# Patient Record
Sex: Female | Born: 1985 | Race: White | Hispanic: No | Marital: Single | Smoking: Former smoker
Health system: Southern US, Community
[De-identification: ages and names within clinical notes are randomized; demographics above are authoritative.]

## PROBLEM LIST (undated history)

## (undated) DIAGNOSIS — N390 Urinary tract infection, site not specified: Secondary | ICD-10-CM

## (undated) DIAGNOSIS — N159 Renal tubulo-interstitial disease, unspecified: Secondary | ICD-10-CM

## (undated) DIAGNOSIS — R87629 Unspecified abnormal cytological findings in specimens from vagina: Secondary | ICD-10-CM

## (undated) DIAGNOSIS — I10 Essential (primary) hypertension: Secondary | ICD-10-CM

## (undated) DIAGNOSIS — M1990 Unspecified osteoarthritis, unspecified site: Secondary | ICD-10-CM

## (undated) HISTORY — DX: Essential (primary) hypertension: I10

## (undated) HISTORY — DX: Renal tubulo-interstitial disease, unspecified: N15.9

## (undated) HISTORY — PX: BLADDER SURGERY: SHX569

## (undated) HISTORY — PX: COLPOSCOPY: SHX161

## (undated) HISTORY — DX: Unspecified abnormal cytological findings in specimens from vagina: R87.629

## (undated) HISTORY — DX: Unspecified osteoarthritis, unspecified site: M19.90

---
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
## 2005-12-02 ENCOUNTER — Inpatient Hospital Stay (HOSPITAL_COMMUNITY): Admission: RE | Admit: 2005-12-02 | Discharge: 2005-12-04 | Payer: Self-pay | Admitting: Obstetrics and Gynecology

## 2009-03-28 ENCOUNTER — Other Ambulatory Visit: Admission: RE | Admit: 2009-03-28 | Discharge: 2009-03-28 | Payer: Self-pay | Admitting: Obstetrics and Gynecology

## 2009-08-22 ENCOUNTER — Inpatient Hospital Stay (HOSPITAL_COMMUNITY): Admission: RE | Admit: 2009-08-22 | Discharge: 2009-08-25 | Payer: Self-pay | Admitting: Obstetrics and Gynecology

## 2009-08-22 ENCOUNTER — Ambulatory Visit: Payer: Self-pay | Admitting: Family Medicine

## 2010-07-07 LAB — COMPREHENSIVE METABOLIC PANEL
ALT: 37 U/L — ABNORMAL HIGH (ref 0–35)
AST: 27 U/L (ref 0–37)
Albumin: 2.8 g/dL — ABNORMAL LOW (ref 3.5–5.2)
Alkaline Phosphatase: 190 U/L — ABNORMAL HIGH (ref 39–117)
BUN: 7 mg/dL (ref 6–23)
CO2: 25 mEq/L (ref 19–32)
Calcium: 9.7 mg/dL (ref 8.4–10.5)
Chloride: 104 mEq/L (ref 96–112)
Creatinine, Ser: 0.46 mg/dL (ref 0.4–1.2)
GFR calc Af Amer: >60 mL/min (ref >60)
GFR calc non Af Amer: >60 mL/min (ref >60)
Glucose, Bld: 81 mg/dL (ref 70–99)
Potassium: 3.6 mEq/L (ref 3.5–5.1)
Sodium: 136 mEq/L (ref 135–145)
Total Bilirubin: 0.3 mg/dL (ref 0.3–1.2)
Total Protein: 7.3 g/dL (ref 6.0–8.3)

## 2010-07-07 LAB — TYPE AND SCREEN
ABO/RH(D): A NEG
Antibody Screen: NEGATIVE

## 2010-07-07 LAB — URINALYSIS, ROUTINE W REFLEX MICROSCOPIC
Bilirubin Urine: NEGATIVE
Glucose, UA: NEGATIVE mg/dL
Hgb urine dipstick: NEGATIVE
Ketones, ur: NEGATIVE mg/dL
Nitrite: NEGATIVE
Protein, ur: NEGATIVE mg/dL
Specific Gravity, Urine: 1.02 (ref 1.005–1.030)
Urobilinogen, UA: 0.2 mg/dL (ref 0.0–1.0)
pH: 7 (ref 5.0–8.0)

## 2010-07-07 LAB — CBC
HCT: 33.2 % — ABNORMAL LOW (ref 36.0–46.0)
HCT: 38.6 % (ref 36.0–46.0)
Hemoglobin: 13.2 g/dL (ref 12.0–15.0)
MCHC: 34.2 g/dL (ref 30.0–36.0)
MCV: 80.3 fL (ref 78.0–100.0)
MCV: 80.4 fL (ref 78.0–100.0)
Platelets: 207 10*3/uL (ref 150–400)
Platelets: 230 10*3/uL (ref 150–400)
RBC: 4.8 MIL/uL (ref 3.87–5.11)
RDW: 14.5 % (ref 11.5–15.5)
RDW: 14.8 % (ref 11.5–15.5)
WBC: 10.9 10*3/uL — ABNORMAL HIGH (ref 4.0–10.5)

## 2010-07-07 LAB — RPR: RPR Ser Ql: NONREACTIVE

## 2010-07-07 LAB — RH IMMUNE GLOB WKUP(>/=20WKS)(NOT WOMEN'S HOSP)

## 2010-09-04 NOTE — H&P (Signed)
NAMEKarra, [name]                 ACCOUNT NO.:  [id number]   MEDICAL RECORD NO.:  [id number]          PATIENT TYPE:  AMB   LOCATION:  DAY                           FACILITY:  [facility]   PHYSICIAN:  Tilda Burrow, M.D. DATE OF BIRTH:  [birth year]   DATE OF ADMISSION:  DATE OF DISCHARGE:  LH                                HISTORY & PHYSICAL   ADMISSION DIAGNOSES:  Pregnancy, 39 weeks' gestation.  Repeat cesarean  section nor for trial of labor.  History of bladder reconstruction as a  child.  Discouraged from vaginal delivery.  Morbid obesity.   HISTORY OF PRESENT ILLNESS:  This 25 year old female, gravida 2, para 1, AB  0, LMP irregular with an ultrasound of August 23rd and then corrected August  15 based on third-trimester ultrasound after a late presentation.  She has  had generous fundal height growth and is expected to have a baby of quite  good size.  It weighed 3065 gm on November 11, 2005 by _office_  ultrasound  estimate.  Clinically, the baby should weigh 8 pounds.  She has a prior  history of a cesarean section to protect her bladder reconstruction  performed as a child.  Review of the operative report details an uneventful  cesarean delivery through a Pfannenstiel incision.  This was performed at  [facility] by Dr. Gilford Silvius, notable for nuchal cord x2 but otherwise uneventful.   PAST MEDICAL HISTORY:  Benign.   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   ALLERGIES:  None.   SOCIAL HISTORY:  Single.  Lives with mom.  Works at [employer].   FAMILY HISTORY:  Positive for hypertension, diabetes and cancer.   PHYSICAL EXAMINATION:  GENERAL:  A morbidly obese, Caucasian female.  VITAL SIGNS:  Weight 277 pounds which is a 4-pound weight loss since June.  Blood pressure 152/62 with small cuff.  Reflex is 1+ to 2+.   Urinalysis shows 3+ blood and leukocytes.  Urine culture was negative.  Urine culture suggests infection and she  was treated with Keflex most  recently.  She has been treated three times in this pregnancy.   PRENATAL LABS:  Blood type A negative.  Rubella immunity present.  Urine  drug screen negative.  RhoGAM administered early August rubella immunity  present.  Hemoglobin 11, hematocrit 37.  Hepatitis, HIV, RPR, GC and  Chlamydia all negative.  Group B strep negative.   She plans to follow for a planned circumcision if it is a female.  I suspect  it is female though.  Birth control pills are planned on using in the  future.  She plans later once she is past 19 to consider permanent  sterilization after six months.   PLAN:  Repeat cesarean section 12/02/2005.      Tilda Burrow, M.D.  Electronically Signed     JVF/MEDQ  D:  12/01/2005  T:  12/01/2005  Job:  [id number]   cc:   Francoise Schaumann. Milford Cage DO, FAAP  Fax: [phone]

## 2010-09-04 NOTE — Op Note (Signed)
[name], [name]                 ACCOUNT NO.:  [id number]   MEDICAL RECORD NO.:  [id number]          PATIENT TYPE:  INP   LOCATION:  A401                          FACILITY:  [facility]   PHYSICIAN:  Tilda Burrow, M.D. DATE OF BIRTH:  [birth year]   DATE OF PROCEDURE:  12/02/2005  DATE OF DISCHARGE:  12/04/2005                                 OPERATIVE REPORT   PREOPERATIVE DIAGNOSES:  1. Pregnancy [redacted] weeks gestation.  2. Repeat cesarean section not for trial of labor.  3. History of bladder reconstruction.   POSTOPERATIVE DIAGNOSES:  1. Pregnancy [redacted] weeks gestation.  2. Repeat cesarean section not for trial of labor.  3. History of bladder reconstruction.  4. Umbilical hernia.   PROCEDURE:  1. Repeat low transverse cervical cesarean section.  2. Umbilical herniorrhaphy.   SURGEON:  Tilda Burrow, M.D.   ASSISTANT:  None.   ANESTHESIA:  General.   COMPLICATIONS:  None.   FINDINGS:  Small urethra making bladder catheterization challenging.  Evidence of prior periurethral surgery. Large 4 cm umbilical hernia, omental  attachments are evident into the old C section scar line.   DESCRIPTION OF PROCEDURE:  The patient was taken to the operating room,  prepped and draped for lower abdominal surgery. A Pfannenstiel incision was  repeated. The patient had an atypical abdomen with a fairly small panniculus  in the midline but generous like skin and abdominal fat on either side. A  relatively easily accessed lower abdomen was encountered and the transverse  Pfannenstiel type incision performed. Omentum was adherent in the old  midline incision separation. This was peeled free. A bladder flap was  developed on the lower uterine segment, transverse uterine incision made and  extended laterally using index finger traction and the fetal vertex guided  into the incision and delivered using vacuum extraction and fundal pressure.  The infant cried promptly and was passed to Dr. Milford Cage  was for further care.  Cord blood samples were obtained, uterus irrigated with antibiotic solution  and closed using running locking #0 chromic. Additional figure-of-eight  sutures were needed x2 to achieve hemostasis. The bladder flap was  reapproximated. The anterior abdominal wall was inspected further and there  was a fairly significant 4 cm umbilical hernia encountered. Using an  inversion technique to access the fascia from beneath, we closed the  umbilical hernia in a running fashion using #0 Prolene. The peritoneum was  then closed. It was apparent that the fascial closure had not been performed  at the peritoneal level at the last time, consistent with acceptable  surgical technique. This time it was closed to reduce recurrence of omental  adhesions to the back of the fascia. The fascia itself was then closed in a  running fashion with #0 Vicryl. The subcutaneous fatty tissues had  interrupted 2-0 plane sutures placed and a JP drain allowed to exit the  incision on one side through the incision itself and staple closure of the  skin completed the procedure. Estimated blood loss 600 mL.      Tilda Burrow, M.D.  Electronically  Signed     JVF/MEDQ  D:  12/06/2005  T:  12/07/2005  Job:  [id number]   cc:   Francoise Schaumann. Milford Cage DO, FAAP  Fax: [phone]

## 2010-09-04 NOTE — Discharge Summary (Signed)
[name], [name]                 ACCOUNT NO.:  [id number]   MEDICAL RECORD NO.:  [id number]          PATIENT TYPE:  INP   LOCATION:  A401                          FACILITY:  [facility]   PHYSICIAN:  Lazaro Arms, M.D.   DATE OF BIRTH:  07-20-1985   DATE OF ADMISSION:  12/02/2005  DATE OF DISCHARGE:  08/18/2007LH                                 DISCHARGE SUMMARY   DISCHARGE DIAGNOSES:  1. Status post a repeat cesarean section.  2. Repair of umbilical hernia.  3. Unremarkable postoperative course.   PROCEDURE:  Stated above.   Please refer to the transcribed history and physical as well as the  antepartum chart for details of admission to the hospital.   HOSPITAL COURSE:  The patient was brought in on the morning of December 02, 2005, underwent a planned repeat cesarean section which went without  difficulty.  She also had a small umbilical hernia repair done at that time.  Her postoperative course has been entirely unremarkable.  She tolerated a  clear liquid and regular diet.  She has voided without symptoms, has been  ambulatory.  Her post-op day #1 hemoglobin is 10.6 and hematocrit is 30.6.  On post-op day #1, her white count is 9300.  Her incision is clean, dry and  intact.  Her JP drain output is appropriate serosanguineous fluid, slightly  blood-tinged, and an appropriate volume.  Her abdominal exam is benign.  She  is eating well and tolerating oral pain medicines.  She is discharged to  home on Tylox and Chromagen and will be seen back in the office next week to  have her incision evaluated and probably her JP drain removed at that time.      Lazaro Arms, M.D.  Electronically Signed     LHE/MEDQ  D:  12/04/2005  T:  12/04/2005  Job:  [id number]

## 2013-10-15 ENCOUNTER — Emergency Department (HOSPITAL_COMMUNITY)
Admission: EM | Admit: 2013-10-15 | Discharge: 2013-10-15 | Disposition: A | Discharge status: Home / Self Care | Payer: Self-pay | Attending: Emergency Medicine | Admitting: Emergency Medicine

## 2013-10-15 ENCOUNTER — Encounter (HOSPITAL_COMMUNITY): Payer: Self-pay | Admitting: Emergency Medicine

## 2013-10-15 DIAGNOSIS — Z3202 Encounter for pregnancy test, result negative: Secondary | ICD-10-CM | POA: Insufficient documentation

## 2013-10-15 DIAGNOSIS — R110 Nausea: Secondary | ICD-10-CM | POA: Insufficient documentation

## 2013-10-15 DIAGNOSIS — F17200 Nicotine dependence, unspecified, uncomplicated: Secondary | ICD-10-CM | POA: Insufficient documentation

## 2013-10-15 DIAGNOSIS — N390 Urinary tract infection, site not specified: Secondary | ICD-10-CM | POA: Insufficient documentation

## 2013-10-15 DIAGNOSIS — R300 Dysuria: Secondary | ICD-10-CM | POA: Insufficient documentation

## 2013-10-15 LAB — URINALYSIS, ROUTINE W REFLEX MICROSCOPIC
BILIRUBIN URINE: NEGATIVE
Glucose, UA: NEGATIVE mg/dL
KETONES UR: NEGATIVE mg/dL
Leukocytes, UA: NEGATIVE
Nitrite: POSITIVE — AB
PH: 6 (ref 5.0–8.0)
SPECIFIC GRAVITY, URINE: 1.02 (ref 1.005–1.030)
UROBILINOGEN UA: 0.2 mg/dL (ref 0.0–1.0)

## 2013-10-15 LAB — PREGNANCY, URINE: Preg Test, Ur: NEGATIVE

## 2013-10-15 LAB — URINE MICROSCOPIC-ADD ON

## 2013-10-15 MED ORDER — CIPROFLOXACIN HCL 500 MG PO TABS: 500.0000 mg | ORAL_TABLET | Freq: Two times a day (BID) | ORAL | Status: DC

## 2013-10-15 MED ORDER — CIPROFLOXACIN HCL 250 MG PO TABS
500.0000 mg | ORAL_TABLET | Freq: Once | ORAL | Status: AC
  Administered 2013-10-15: 500 mg via ORAL
  Filled 2013-10-15: qty 2

## 2013-10-15 MED ORDER — FLUCONAZOLE 100 MG PO TABS
150.0000 mg | ORAL_TABLET | Freq: Once | ORAL | Status: AC
  Administered 2013-10-15: 150 mg via ORAL
  Filled 2013-10-15: qty 2

## 2013-10-15 MED ORDER — PHENAZOPYRIDINE HCL 200 MG PO TABS: 200.0000 mg | ORAL_TABLET | Freq: Three times a day (TID) | ORAL | Status: DC

## 2013-10-15 MED ORDER — PHENAZOPYRIDINE HCL 100 MG PO TABS
200.0000 mg | ORAL_TABLET | Freq: Once | ORAL | Status: AC
  Administered 2013-10-15: 200 mg via ORAL
  Filled 2013-10-15: qty 2

## 2013-10-15 NOTE — ED Notes (Signed)
Attempted in and out cath without success, Kerrie BuffaloHope Neese, NP notified

## 2013-10-15 NOTE — ED Notes (Signed)
Low back pain , nausea, Thinks she has a UTI

## 2013-10-15 NOTE — ED Provider Notes (Signed)
History/physical exam/procedure(s) were performed by non-physician practitioner and as supervising physician I was immediately available for consultation/collaboration. I have reviewed all notes and am in agreement with care and plan.   Danielle S Ray, MD 10/15/13 2336 

## 2013-10-15 NOTE — ED Provider Notes (Signed)
CSN: [id number]     Arrival date & time 10/15/13  1920 History   None    Chief Complaint  Patient presents with  . Back Pain     (Consider location/radiation/quality/duration/timing/severity/associated sxs/prior Treatment) Patient is a 28 y.o. female presenting with dysuria. The history is provided by the patient.  Dysuria Pain quality:  Burning Pain severity:  Moderate Onset quality:  Gradual Duration:  2 weeks Timing:  Constant Progression:  Waxing and waning Chronicity:  Recurrent Recent urinary tract infections: yes   Relieved by:  Nothing Worsened by:  Nothing tried Ineffective treatments:  Cranberry juice Urinary symptoms: discolored urine, foul-smelling urine, frequent urination and hematuria   Associated symptoms: abdominal pain and nausea   Associated symptoms: no fever and no vomiting   Risk factors: recurrent urinary tract infections   Risk factors: no renal disease    Shane L Cira ServantManus is a 28 y.o. female who presents to the ED with back pain, dysuria, nausea and fowl smelling urine. 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 She has had frequent UTI's that she thinks resulted from that. When she get treated for UTI she always gets a yeast infection. She denies vaginal discharge. She does not have a PCP. [facility].   History reviewed. No pertinent past medical history. Past Surgical History  Procedure Laterality Date  . Bladder surgery    . Cesarean section     History reviewed. No pertinent family history. History  Substance Use Topics  . Smoking status: Current Every Day Smoker  . Smokeless tobacco: Not on file  . Alcohol Use: Yes   OB History   Grav Para Term Preterm Abortions TAB SAB Ect Mult Living                 Review of Systems  Constitutional: Negative for fever.  Gastrointestinal: Positive for nausea and abdominal pain.  Negative for vomiting.  Genitourinary: Positive for dysuria.  Musculoskeletal: Positive for back pain.  all other systems negative.     Allergies  Review of patient's allergies indicates no known allergies.  Home Medications   Prior to Admission medications   Not on File   BP 142/79  Pulse 64  Temp(Src) 98.3 F (36.8 C)  Resp 24  Ht 5\' 6"  (1.676 m)  Wt 260 lb (117.935 kg)  BMI 41.99 kg/m2  SpO2 98%  LMP 08/01/2013 Physical Exam  Nursing note and vitals reviewed. Constitutional: She is oriented to person, place, and time. She appears well-developed and well-nourished. No distress.  HENT:  Head: Normocephalic.  Eyes: Conjunctivae and EOM are normal.  Neck: Neck supple.  Cardiovascular: Normal rate.   Pulmonary/Chest: Effort normal and breath sounds normal.  Abdominal: Soft. Bowel sounds are normal. There is tenderness in the suprapubic area. There is no CVA tenderness.  Musculoskeletal: Normal range of motion.  Neurological: She is alert and oriented to person, place, and time. No cranial nerve deficit.  Skin: Skin is warm and dry.  Psychiatric: She has a normal mood and affect. Her behavior is normal.    ED Course  Procedures (including critical care time) Labs, Cipro 500 mg PO, Pyridium 200 mg PO Results for orders placed during the hospital encounter of 10/15/13 (from the past 24 hour(s))  URINALYSIS, ROUTINE W REFLEX MICROSCOPIC     Status: Abnormal   Collection Time  10/15/13  8:52 PM      Result Value Ref Range   Color, Urine YELLOW  YELLOW   APPearance HAZY (*) CLEAR   Specific Gravity, Urine 1.020  1.005 - 1.030   pH 6.0  5.0 - 8.0   Glucose, UA NEGATIVE  NEGATIVE mg/dL   Hgb urine dipstick LARGE (*) NEGATIVE   Bilirubin Urine NEGATIVE  NEGATIVE   Ketones, ur NEGATIVE  NEGATIVE mg/dL   Protein, ur TRACE (*) NEGATIVE mg/dL   Urobilinogen, UA 0.2  0.0 - 1.0 mg/dL   Nitrite POSITIVE (*) NEGATIVE   Leukocytes, UA NEGATIVE  NEGATIVE  PREGNANCY, URINE      Status: None   Collection Time    10/15/13  8:52 PM      Result Value Ref Range   Preg Test, Ur NEGATIVE  NEGATIVE  URINE MICROSCOPIC-ADD ON     Status: Abnormal   Collection Time    10/15/13  8:52 PM      Result Value Ref Range   Squamous Epithelial / LPF RARE  RARE   WBC, UA 7-10  <3 WBC/hpf   RBC / HPF 21-50  <3 RBC/hpf   Bacteria, UA MANY (*) RARE     MDM  28 y.o. female with frequency, urgency, dysuria and low back pain x 2 weeks with worsening symptoms. Will treat for UTI and send urine for culture. [facility]. She will return here as needed. I have reviewed this patient's vital signs, nurses notes, appropriate labs and discussed finding and plan of care with the patient and she voices understanding.    Medication List         ciprofloxacin 500 MG tablet  Commonly known as:  CIPRO  Take 1 tablet (500 mg total) by mouth 2 (two) times daily.     phenazopyridine 200 MG tablet  Commonly known as:  PYRIDIUM  Take 1 tablet (200 mg total) by mouth 3 (three) times daily.           [place] August[place] M Neese, [place] 10/15/13 2141

## 2013-10-19 LAB — URINE CULTURE

## 2013-10-20 ENCOUNTER — Telehealth (HOSPITAL_BASED_OUTPATIENT_CLINIC_OR_DEPARTMENT_OTHER): Payer: Self-pay | Admitting: Emergency Medicine

## 2013-10-20 NOTE — Telephone Encounter (Signed)
Post ED Visit - Positive Culture Follow-up  Culture report reviewed by antimicrobial stewardship pharmacist: [] Wes Dulaney, Pharm.D., BCPS [] Jeremy Frens, Pharm.D., BCPS [] Elizabeth Martin, Pharm.D., BCPS [x] Minh Pham, Pharm.D., BCPS, AAHIVP [] Michelle Turner, Pharm.D., BCPS, AAHIVP  Positive urine culture Treated with Cipro, organism sensitive to the same and no further patient follow-up is required at this time.  [name] 10/20/2013, 9:53 AM   

## 2014-05-12 ENCOUNTER — Encounter (HOSPITAL_COMMUNITY): Payer: Self-pay | Admitting: Emergency Medicine

## 2014-05-12 ENCOUNTER — Emergency Department (HOSPITAL_COMMUNITY)
Admission: EM | Admit: 2014-05-12 | Discharge: 2014-05-12 | Disposition: A | Discharge status: Home / Self Care | Payer: Self-pay | Attending: Emergency Medicine | Admitting: Emergency Medicine

## 2014-05-12 DIAGNOSIS — B9689 Other specified bacterial agents as the cause of diseases classified elsewhere: Secondary | ICD-10-CM

## 2014-05-12 DIAGNOSIS — J028 Acute pharyngitis due to other specified organisms: Secondary | ICD-10-CM

## 2014-05-12 DIAGNOSIS — H9203 Otalgia, bilateral: Secondary | ICD-10-CM | POA: Insufficient documentation

## 2014-05-12 DIAGNOSIS — Z79899 Other long term (current) drug therapy: Secondary | ICD-10-CM | POA: Insufficient documentation

## 2014-05-12 DIAGNOSIS — Z792 Long term (current) use of antibiotics: Secondary | ICD-10-CM | POA: Insufficient documentation

## 2014-05-12 DIAGNOSIS — Z720 Tobacco use: Secondary | ICD-10-CM | POA: Insufficient documentation

## 2014-05-12 DIAGNOSIS — J029 Acute pharyngitis, unspecified: Secondary | ICD-10-CM | POA: Insufficient documentation

## 2014-05-12 LAB — RAPID STREP SCREEN (MED CTR MEBANE ONLY): STREPTOCOCCUS, GROUP A SCREEN (DIRECT): NEGATIVE

## 2014-05-12 MED ORDER — PENICILLIN G BENZATHINE 1200000 UNIT/2ML IM SUSP
1.2000 10*6.[IU] | Freq: Once | INTRAMUSCULAR | Status: AC
  Administered 2014-05-12: 1.2 10*6.[IU] via INTRAMUSCULAR
  Filled 2014-05-12: qty 2

## 2014-05-12 MED ORDER — ACETAMINOPHEN-CODEINE #3 300-30 MG PO TABS: 1.0000 | ORAL_TABLET | Freq: Four times a day (QID) | ORAL | Status: DC | PRN

## 2014-05-12 MED ORDER — DEXAMETHASONE SODIUM PHOSPHATE 10 MG/ML IJ SOLN
10.0000 mg | Freq: Once | INTRAMUSCULAR | Status: AC
  Administered 2014-05-12: 10 mg via INTRAMUSCULAR
  Filled 2014-05-12: qty 1

## 2014-05-12 MED ORDER — IBUPROFEN 800 MG PO TABS
800.0000 mg | ORAL_TABLET | Freq: Once | ORAL | Status: AC
  Administered 2014-05-12: 800 mg via ORAL
  Filled 2014-05-12: qty 1

## 2014-05-12 NOTE — Discharge Instructions (Signed)
1. Medications: Tylenol #3 for severe throat pain, usual home medications 2. Treatment: rest, drink plenty of fluids,  3. Follow Up: Please followup with your primary doctor in 3 days for discussion of your diagnoses and further evaluation after today's visit; if you do not have a primary care doctor use the resource guide provided to find one; Please return to the ER for difficulty breathing, feelings of throat closing, persistent high fevers or generally worsening symptoms   Pharyngitis Pharyngitis is redness, pain, and swelling (inflammation) of your pharynx.  CAUSES  Pharyngitis is usually caused by infection. Most of the time, these infections are from viruses (viral) and are part of a cold. However, sometimes pharyngitis is caused by bacteria (bacterial). Pharyngitis can also be caused by allergies. Viral pharyngitis may be spread from person to person by coughing, sneezing, and personal items or utensils (cups, forks, spoons, toothbrushes). Bacterial pharyngitis may be spread from person to person by more intimate contact, such as kissing.  SIGNS AND SYMPTOMS  Symptoms of pharyngitis include:   Sore throat.   Tiredness (fatigue).   Low-grade fever.   Headache.  Joint pain and muscle aches.  Skin rashes.  Swollen lymph nodes.  Plaque-like film on throat or tonsils (often seen with bacterial pharyngitis). DIAGNOSIS  Your health care provider will ask you questions about your illness and your symptoms. Your medical history, along with a physical exam, is often all that is needed to diagnose pharyngitis. Sometimes, a rapid strep test is done. Other lab tests may also be done, depending on the suspected cause.  TREATMENT  Viral pharyngitis will usually get better in 3-4 days without the use of medicine. Bacterial pharyngitis is treated with medicines that kill germs (antibiotics).  HOME CARE INSTRUCTIONS   Drink enough water and fluids to keep your urine clear or pale yellow.    Only take over-the-counter or prescription medicines as directed by your health care provider:   If you are prescribed antibiotics, make sure you finish them even if you start to feel better.   Do not take aspirin.   Get lots of rest.   Gargle with 8 oz of salt water ( tsp of salt per 1 qt of water) as often as every 1-2 hours to soothe your throat.   Throat lozenges (if you are not at risk for choking) or sprays may be used to soothe your throat. SEEK MEDICAL CARE IF:   You have large, tender lumps in your neck.  You have a rash.  You cough up green, yellow-brown, or bloody spit. SEEK IMMEDIATE MEDICAL CARE IF:   Your neck becomes stiff.  You drool or are unable to swallow liquids.  You vomit or are unable to keep medicines or liquids down.  You have severe pain that does not go away with the use of recommended medicines.  You have trouble breathing (not caused by a stuffy nose). MAKE SURE YOU:   Understand these instructions.  Will watch your condition.  Will get help right away if you are not doing well or get worse. Document Released: 04/05/2005 Document Revised: 01/24/2013 Document Reviewed: 12/11/2012 [facility] Patient Information 2015 [place], [place]. This information is not intended to replace advice given to you by your health care provider. Make sure you discuss any questions you have with your health care provider.   Emergency Department Resource Guide 1) Find a Doctor and Pay Out of Pocket Although you won't have to find out who is covered by your insurance plan, it  is a good idea to ask around and get recommendations. You will then need to call the office and see if the doctor you have chosen will accept you as a new patient and what types of options they offer for patients who are self-pay. Some doctors offer discounts or will set up payment plans for their patients who do not have insurance, but you will need to ask so you aren't surprised when you  get to your appointment.  2) Contact Your Local Health Department Not all health departments have doctors that can see patients for sick visits, but many do, so it is worth a call to see if yours does. If you don't know where your local health department is, you can check in your phone book. The CDC also has a tool to help you locate your state's health department, and many state websites also have listings of all of their local health departments.  3) Find a Walk-in Clinic If your illness is not likely to be very severe or complicated, you may want to try a walk in clinic. These are popping up all over the country in pharmacies, drugstores, and shopping centers. They're usually staffed by nurse practitioners or physician assistants that have been trained to treat common illnesses and complaints. They're usually fairly quick and inexpensive. However, if you have serious medical issues or chronic medical problems, these are probably not your best option.  No Primary Care Doctor: - Call Health Connect at  [phone] - they can help you locate a primary care doctor that  accepts your insurance, provides certain services, etc. - Physician Referral Service- [phone]  Chronic Pain Problems: Organization         Address  Phone   Notes  [facility]  [phone] Patients need to be referred by their primary care doctor.   Medication Assistance: Organization         Address  Phone   Notes  [facility] [address] [place], [place] [address] [phone] --Must be a resident of [facility] -- Must have NO insurance coverage whatsoever (no Medicaid/ Medicare, etc.) -- The pt. MUST have a primary care doctor that directs their care regularly and follows them in the community   [facility]ist  [phone]   [employer]  [phone]    Agencies that provide inexpensive medical care: Organization         Address  Phone    Notes  [facility]  [phone]   [facility]    [phone]) [phone]   [facility] [address] [place], [place] [address] [phone]   [facility] [address], [place] [phone]   Planned Parenthood    [phone]   [facility]    [phone]) [phone]   [facility]  [address], [place] Phone:  [phone]) [phone], Fax:  [phone]) [phone] Hours of Operation:  9 am - 6 pm, M-F.  Also accepts Medicaid/Medicare and self-pay.  [facility]  [address], [place] Phone: [phone]) [phone], Fax: [phone]. Hours of Operation:  8:30 am - 5:30 pm, M-F.  Also accepts Medicaid and self-pay.  [facility] [place] [address], [place] Phone: [phone]   [facility] [address], [place], [place] [phone], Ext. [phone] Mondays & Thursdays: 7-9 AM.  First 15 patients are seen on a first come, first serve basis.    Medicaid-accepting [facility] Providers:  Organization         Address  Phone   Notes  [facility] [address], [place] [phone] Also accepts self-pay patients.  [facility] [address] Laurell Josephs [place], [place]  [phone]   [facility] [address], [place] [phone]   [facility] [address], [place] [phone]   Renaye Rakers [address], [place]   [phone] Only accepts [place] Access [place] patients after they have their name applied to their card.   Self-Pay (no insurance) in [facility]:  Organization         Address  Phone   Notes  Sickle Cell Patients, [facility] [address] [place], [place] [phone]   [facility] [address] [place], [place] [phone]   [facility] G[facility]  [facility]  [address], [place] [phone]   [facility]/Dr. Osei-Bonsu  [address], [place] or [address], [place] [phone] Phone number for both [place] and [place] locations is the same.  [facility] and [facility] [address] [place] [phone]   [facility] [address] [place] or [address] [phone]   [facility] [address], [place] [phone], phone; [phone], fax Sees patients 1st and 3rd Saturday of every month.  Must not qualify for public or private insurance (i.e. Medicaid, Medicare, [place] Health Choice, Veterans' Benefits)  Household income should be no more than 200% of the poverty level The clinic cannot treat you if you are pregnant or think you are pregnant  Sexually transmitted diseases are not treated at the clinic.    Dental Care: Organization         Address  Phone  Notes  [facility] [address] [place], [place] [phone] Accepts children up to age 17 who are enrolled in [place] or [place] Health Choice; pregnant women with a Medicaid card; and children who have applied for Medicaid or [place] Health Choice, but were declined, whose parents can pay a reduced fee at time of service.  [facility]  [address], [place] [phone] Accepts children up to age 73 who are enrolled in [place] or [place] Health Choice; pregnant women with a Medicaid card; and children who have applied for Medicaid or [place] Health Choice, but were declined, whose parents can pay a reduced fee at time of service.  [facility]  [address] [place], [place] [phone] Patients are seen by appointment only. Walk-ins are not accepted. [facility] will see patients 62 years of age  and older. Monday - Tuesday (8am-5pm) Most Wednesdays (8:30-5pm) $30 per visit, cash only  [facility]  [address], [facility] [phone] Patients are seen by appointment only. Walk-ins are not accepted. [facility] will see patients 37 years of age and older. One Wednesday Evening (Monthly: Volunteer Based).  $30 per visit, cash only  [employer]  [phone] for adults; Children under age 36, call Graduate  Pediatric Dentistry at [phone]. Children aged 44-14, please call [phone] to request a pediatric application.  Dental services are provided in all areas of dental care including fillings, crowns and bridges, complete and partial dentures, implants, gum treatment, root canals, and extractions. Preventive care is also provided. Treatment is provided to both adults and children. Patients are selected via a lottery and there is often a waiting list.   [facility] [address], [place]  [phone] [email or web address]   [facility] [address] [place], [place] [phone], Ext. [phone] Second and Fourth Thursday of each month, opens at 6:30 AM; Clinic ends at 9 AM.  Patients are seen on a first-come first-served basis, and a limited number are seen during each clinic.   [facility]  [address] Ether Griffins [place], [place] [phone]   Eligibility Requirements You must have lived in [place], [place], or [place] counties for at least the last three months.   You cannot be eligible for state or federal sponsored [place], including [employer], [place], or [employer].   You generally cannot be eligible for healthcare insurance through your employer.    How to apply: Eligibility screenings are held every Tuesday and Wednesday afternoon from 1:00 pm until 4:00 pm. You do not need an appointment for the interview!  [facility] [address], [place], [place] [phone]   [facility] Health Department  [phone]   [facility] Health Department  [phone]   [facility] Health Department  [phone]    Behavioral Health Resources in the Community: Intensive Outpatient Programs Organization         Address  Phone  Notes  [facility] [address] [place], [place] [phone]   [facility]patient [address], [place], [place] [phone]   [facility] [address] [place], [place]  [phone]   [facility] [address],  [place], [place] [phone] or [phone]   Substance Abuse Resources Organization         Address  Phone  Notes  [facility]  [phone]   [facility]  [phone]   [facility] [place]  [phone]   Floydene Flock  [phone]   Residential & Outpatient Substance Abuse Program  [phone]   Psychological Services Organization         Address  Phone  Notes  [facility]  [phone]   [facility]  [phone]   [facility] [address], [place] [phone] or [phone]    Mobile Crisis Teams Organization         Address  Phone  Notes  [facility], Mobile Crisis Care Unit  [phone]   [facility]  [address] [place], [place] [phone]   Doristine Locks [address] [place] [phone]    Self-Help/Support Groups Organization         Address  Phone             Notes  [facility] [place] - variety of support groups  336- I7437963 Call for more information  Narcotics Anonymous (NA), [facility] [address], [employer] [place]  2 meetings at this location   Statistician         Address  Phone  Notes  [facility] [address],  [place]  [phone]   [facility]  [address] [place] [address], [place], [place] [phone]   [facility] [address] [place], [place] [phone] Admissions: 8am-3pm M-F  [facility] [address],    [place], [place] [phone]   [facility] [address] [place], [place], [place] [phone]   [facility] [address],  [place], [place] [phone]   [facility] - Intensive Outpatient [address], [place], [place] [phone]   [facility] ([facility] [address]  [place], [place] [phone] or [phone]   [facility] ([facility]) [address] [place], [place] [phone] Accepts Medicaid  [facility] [address]  [place] [phone] Substance Abuse/Addiction Treatment   [facility] Organization         Address  Phone  Notes  [facility]  [phone]   Angie Fava, PhD [address] Ervin Knack [place], [place]   [phone] or [phone]   [facility] Behavioral   [address] [place], [place] [phone]   [facility] [address] [place], [place] [phone] Insurance/Medicaid/sponsorship through [facility] and Families [address]                                    [place], [place] [phone] Therapy/tele-psych/case  [facility] [address], [place] [phone]    Dr. Lolly Mustache  [phone]   [facility]  [employer] [facility] Dept. 1) [address] [place] 2) [address] [place] 3)  [address], [place] [phone]  [phone]   [facility] Child Abuse Hotline 831-835-6307 or [phone] (After Hours)

## 2014-05-12 NOTE — ED Notes (Signed)
Patient with no complaints at this time. Respirations even and unlabored. Skin warm/dry. Discharge instructions reviewed with patient at this time. Patient given opportunity to voice concerns/ask questions. Patient discharged at this time and left Emergency Department with steady gait. Visualized leaving ED with Rx in hand

## 2014-05-12 NOTE — ED Provider Notes (Signed)
CSN: [id number]     Arrival date & time 05/12/14  1512 History   First MD Initiated Contact with Patient 05/12/14 1724     Chief Complaint  Patient presents with  . Sore Throat     (Consider location/radiation/quality/duration/timing/severity/associated sxs/prior Treatment) Patient is a 29 y.o. female presenting with pharyngitis. The history is provided by the patient and medical records. No language interpreter was used.  Sore Throat Associated symptoms include coughing (occasional) and a sore throat. Pertinent negatives include no abdominal pain, chest pain, chills, congestion, fatigue, fever, headaches, nausea, neck pain, rash or vomiting.     [name] is a 29 y.o. female  with no major medical Hx presents to the Emergency Department complaining of gradual, persistent, progressively worsening ST onset 4 days ago.  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  She reports associated pain in her bilateral ears, worse and night and occasional cough.  She also reports intermittent fever to 102 at [facility]  Pt reports no difficulty swallowing, but severe pain with swallowing.  She denies headache, neck pain, neck stiffness, chest pain, SOB, abd pain, N/V/D, weakness, dizziness, syncope. Pt [facility] powder helps her pain and eating seems to make it worse.    History reviewed. No pertinent past medical history.   Past Surgical History  Procedure Laterality Date  . Bladder surgery    . Cesarean section     Family History  Problem Relation Age of Onset  . Cancer Other   . Diabetes Other    History  Substance Use Topics  . Smoking status: Current Every Day Smoker -- 0.05 packs/day for 2 years    Types: Cigarettes  . Smokeless tobacco: Never Used  . Alcohol Use: No   OB History    Gravida Para Term Preterm AB TAB SAB Ectopic Multiple Living   3 3 3       3      Review of Systems  Constitutional: Negative for  fever, chills and fatigue.  HENT: Positive for ear pain and sore throat. Negative for congestion, dental problem, drooling, facial swelling, mouth sores, postnasal drip, rhinorrhea, trouble swallowing and voice change.   Eyes: Negative for pain.  Respiratory: Positive for cough (occasional). Negative for chest tightness and shortness of breath.   Cardiovascular: Negative for chest pain.  Gastrointestinal: Negative for nausea, vomiting and abdominal pain.  Musculoskeletal: Negative for neck pain and neck stiffness.  Skin: Negative for rash.  Neurological: Negative for facial asymmetry and headaches.  Hematological: Negative for adenopathy.  Psychiatric/Behavioral: The patient is not nervous/anxious.       Allergies  Review of patient's allergies indicates no known allergies.  Home Medications   Prior to Admission medications   Medication Sig Start Date End Date Taking? Authorizing Provider  acetaminophen-codeine (TYLENOL #3) 300-30 MG per tablet Take 1-2 tablets by mouth every 6 (six) hours as needed (severe sore throat). 05/12/14   Keyarah Mcroy, PA-C  ciprofloxacin (CIPRO) 500 MG tablet Take 1 tablet (500 mg total) by mouth 2 (two) times daily. 10/15/13   Hope Orlene OchM Neese, NP  phenazopyridine (PYRIDIUM) 200 MG tablet Take 1 tablet (200 mg total) by mouth 3 (three) times daily. 10/15/13   Hope Orlene OchM Neese, NP   BP 148/80 mmHg  Pulse 78  Temp(Src) 97.5 F (36.4 C) (Oral)  Resp 19  Ht 5\' 6"  (1.676 m)  Wt 260 lb (117.935 kg)  BMI 41.99 kg/m2  SpO2 100%  LMP 04/15/2014 Physical Exam  Constitutional: She appears well-developed and well-nourished. No distress.  HENT:  Head: Normocephalic and atraumatic.  Right Ear: Tympanic membrane, external ear and ear canal normal.  Left Ear: Tympanic membrane, external ear and ear canal normal.  Nose: Nose normal. No mucosal edema or rhinorrhea.  Mouth/Throat: Uvula is midline and mucous membranes are normal. Mucous membranes are not dry. No  trismus in the jaw. No uvula swelling. Oropharyngeal exudate, posterior oropharyngeal edema and posterior oropharyngeal erythema present. No tonsillar abscesses.  Posterior oropharynx with erythema, edema and exudate on the tonsils No swelling of the uvula, uvula midline No unilateral swelling  Eyes: Conjunctivae are normal.  Neck: Normal range of motion, full passive range of motion without pain and phonation normal. No tracheal tenderness, no spinous process tenderness and no muscular tenderness present. No rigidity. No erythema and normal range of motion present. No Brudzinski's sign and no Kernig's sign noted.  Range of motion without pain no No midline or paraspinal tenderness Normal phonation No stridor Handling secretions without difficulty No nuchal rigidity or meningeal signs  Cardiovascular: Normal rate, regular rhythm, normal heart sounds and intact distal pulses.   No murmur heard. Pulses:      Radial pulses are 2+ on the right side, and 2+ on the left side.  Pulmonary/Chest: Effort normal and breath sounds normal. No stridor. No respiratory distress. She has no decreased breath sounds. She has no wheezes.  Equal chest expansion, clear and equal breath sounds without focal wheezes, rhonchi or rales  Abdominal: Soft. There is no tenderness.  Musculoskeletal: Normal range of motion.  Lymphadenopathy:       Head (right side): Submandibular and tonsillar adenopathy present. No submental, no preauricular, no posterior auricular and no occipital adenopathy present.       Head (left side): Submandibular and tonsillar adenopathy present. No submental, no preauricular, no posterior auricular and no occipital adenopathy present.    She has cervical adenopathy (anterior).       Right cervical: Superficial cervical adenopathy present. No deep cervical and no posterior cervical adenopathy present.      Left cervical: Superficial cervical adenopathy present. No deep cervical and no posterior  cervical adenopathy present.  Neurological: She is alert.  Alert and oriented Moves all extremities without ataxia  Skin: Skin is warm and dry. She is not diaphoretic.  Psychiatric: She has a normal mood and affect.  Nursing note and vitals reviewed.   ED Course  Procedures (including critical care time) Labs Review Labs Reviewed  RAPID STREP SCREEN  CULTURE, GROUP A STREP    Imaging Review No results found.   EKG Interpretation None      MDM   Final diagnoses:  Bacterial pharyngitis  Otalgia of both ears    [name] presents with sore throat for 4 days.  Pt with reports of fever but afebrile here with tonsillar exudate, cervical lymphadenopathy, & dysphagia; diagnosis of bacterial pharyngitis.  Pt's rapid strep negative, but I believe she meets the criteria for treatment. Treated in the ED with steroids, NSAIDs, and PCN IM.  Pt is driving and will not be given narcotics here. Pt appears mildly dehydrated, discussed importance of water rehydration. Presentation non concerning for PTA or infxn spread to soft tissue. No trismus or uvula deviation. Specific return precautions discussed. Pt able to drink water in ED without difficulty with intact air way. Recommended PCP follow up.   I have personally reviewed  patient's vitals, nursing note and any pertinent labs or imaging.  I performed an focused physical exam; undressed when appropriate .    It has been determined that no acute conditions requiring further emergency intervention are present at this time. The patient/guardian have been advised of the diagnosis and plan. I reviewed any labs and imaging including any potential incidental findings. We have discussed signs and symptoms that warrant return to the ED and they are listed in the discharge instructions.    Medications  penicillin g benzathine (BICILLIN LA) 1200000 UNIT/2ML injection 1.2 Million Units (1.2 Million Units Intramuscular Given 05/12/14 1750)   dexamethasone (DECADRON) injection 10 mg (10 mg Intramuscular Given 05/12/14 1750)  ibuprofen (ADVIL,MOTRIN) tablet 800 mg (800 mg Oral Given 05/12/14 1749)    Vital signs are stable at discharge.   BP 148/80 mmHg  Pulse 78  Temp(Src) 97.5 F (36.4 C) (Oral)  Resp 19  Ht  (1.676 m)  Wt 260 lb (117.935 kg)  BMI 41.99 kg/m2  SpO2 100%  LMP 04/15/2014    Dierdre Forth, PA-C 05/12/14 1754  Geoffery Lyons, MD 05/12/14 [phone]

## 2014-05-12 NOTE — ED Notes (Signed)
Patient c/o severe sore throat with fever x3 days. Denies any nausea, vomiting, or diarrhea. No drooling noted. O2 sat 99%.

## 2014-05-15 LAB — CULTURE, GROUP A STREP

## 2014-11-22 ENCOUNTER — Emergency Department (HOSPITAL_COMMUNITY)
Admission: EM | Admit: 2014-11-22 | Discharge: 2014-11-22 | Disposition: A | Discharge status: Home / Self Care | Payer: Self-pay | Attending: Emergency Medicine | Admitting: Emergency Medicine

## 2014-11-22 ENCOUNTER — Emergency Department (HOSPITAL_COMMUNITY): Payer: Self-pay

## 2014-11-22 ENCOUNTER — Encounter (HOSPITAL_COMMUNITY): Payer: Self-pay | Admitting: Emergency Medicine

## 2014-11-22 DIAGNOSIS — Z720 Tobacco use: Secondary | ICD-10-CM | POA: Insufficient documentation

## 2014-11-22 DIAGNOSIS — R079 Chest pain, unspecified: Secondary | ICD-10-CM | POA: Insufficient documentation

## 2014-11-22 DIAGNOSIS — R6883 Chills (without fever): Secondary | ICD-10-CM | POA: Insufficient documentation

## 2014-11-22 DIAGNOSIS — Z79899 Other long term (current) drug therapy: Secondary | ICD-10-CM | POA: Insufficient documentation

## 2014-11-22 DIAGNOSIS — N390 Urinary tract infection, site not specified: Secondary | ICD-10-CM | POA: Insufficient documentation

## 2014-11-22 DIAGNOSIS — Z3202 Encounter for pregnancy test, result negative: Secondary | ICD-10-CM | POA: Insufficient documentation

## 2014-11-22 LAB — COMPREHENSIVE METABOLIC PANEL
ALT: 34 U/L (ref 14–54)
ANION GAP: 7 (ref 5–15)
AST: 27 U/L (ref 15–41)
Albumin: 3.7 g/dL (ref 3.5–5.0)
Alkaline Phosphatase: 68 U/L (ref 38–126)
BUN: 10 mg/dL (ref 6–20)
CALCIUM: 9.1 mg/dL (ref 8.9–10.3)
CHLORIDE: 110 mmol/L (ref 101–111)
CO2: 22 mmol/L (ref 22–32)
CREATININE: 0.59 mg/dL (ref 0.44–1.00)
GFR calc Af Amer: >60 mL/min (ref >60)
GFR calc non Af Amer: >60 mL/min (ref >60)
Glucose, Bld: 134 mg/dL — ABNORMAL HIGH (ref 65–99)
POTASSIUM: 3.6 mmol/L (ref 3.5–5.1)
SODIUM: 139 mmol/L (ref 135–145)
TOTAL PROTEIN: 7.1 g/dL (ref 6.5–8.1)
Total Bilirubin: 0.4 mg/dL (ref 0.3–1.2)

## 2014-11-22 LAB — CBC WITH DIFFERENTIAL/PLATELET
BASOS ABS: 0.1 10*3/uL (ref 0.0–0.1)
Basophils Relative: 1 % (ref 0–1)
EOS PCT: 1 % (ref 0–5)
Eosinophils Absolute: 0.1 10*3/uL (ref 0.0–0.7)
HCT: 41.2 % (ref 36.0–46.0)
HEMOGLOBIN: 13.7 g/dL (ref 12.0–15.0)
LYMPHS ABS: 2.3 10*3/uL (ref 0.7–4.0)
LYMPHS PCT: 33 % (ref 12–46)
MCH: 27 pg (ref 26.0–34.0)
MCHC: 33.3 g/dL (ref 30.0–36.0)
MCV: 81.1 fL (ref 78.0–100.0)
MONOS PCT: 6 % (ref 3–12)
Monocytes Absolute: 0.4 10*3/uL (ref 0.1–1.0)
Neutro Abs: 4.1 10*3/uL (ref 1.7–7.7)
Neutrophils Relative %: 59 % (ref 43–77)
Platelets: 193 10*3/uL (ref 150–400)
RBC: 5.08 MIL/uL (ref 3.87–5.11)
RDW: 14.6 % (ref 11.5–15.5)
WBC: 7 10*3/uL (ref 4.0–10.5)

## 2014-11-22 LAB — URINALYSIS, ROUTINE W REFLEX MICROSCOPIC
BILIRUBIN URINE: NEGATIVE
GLUCOSE, UA: NEGATIVE mg/dL
Ketones, ur: NEGATIVE mg/dL
Nitrite: POSITIVE — AB
Protein, ur: NEGATIVE mg/dL
Specific Gravity, Urine: 1.015 (ref 1.005–1.030)
Urobilinogen, UA: 0.2 mg/dL (ref 0.0–1.0)
pH: 6 (ref 5.0–8.0)

## 2014-11-22 LAB — TROPONIN I

## 2014-11-22 LAB — PREGNANCY, URINE: PREG TEST UR: NEGATIVE

## 2014-11-22 LAB — URINE MICROSCOPIC-ADD ON

## 2014-11-22 IMAGING — DX DG CHEST 2V
2 series · 2 of 2 positions shown · non-contrast
Comparison: None.

CLINICAL DATA: Left-sided chest pain, intermittent over the last
week. Some associated nausea.

EXAM:
CHEST  2 VIEW

[chest pa]
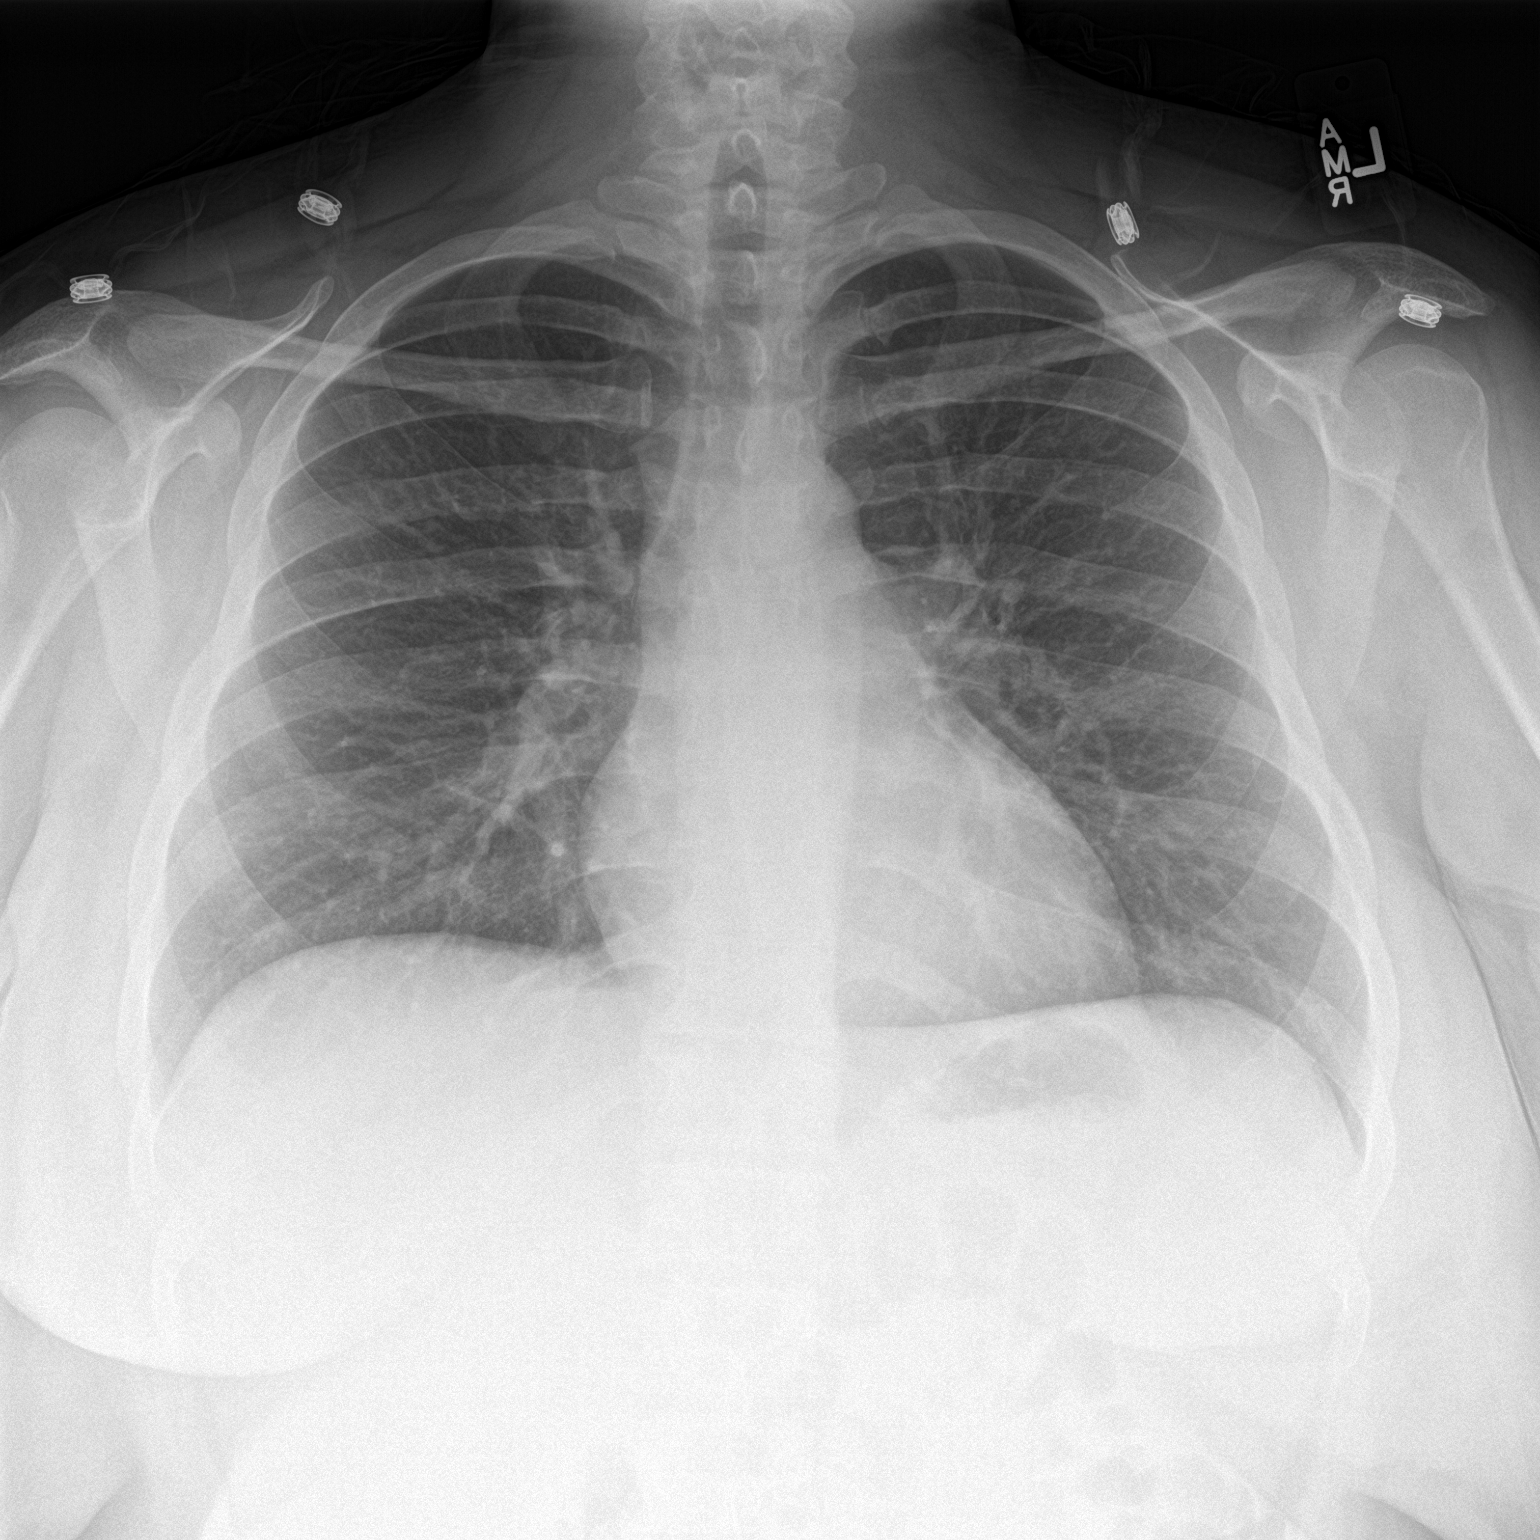

[chest lat]
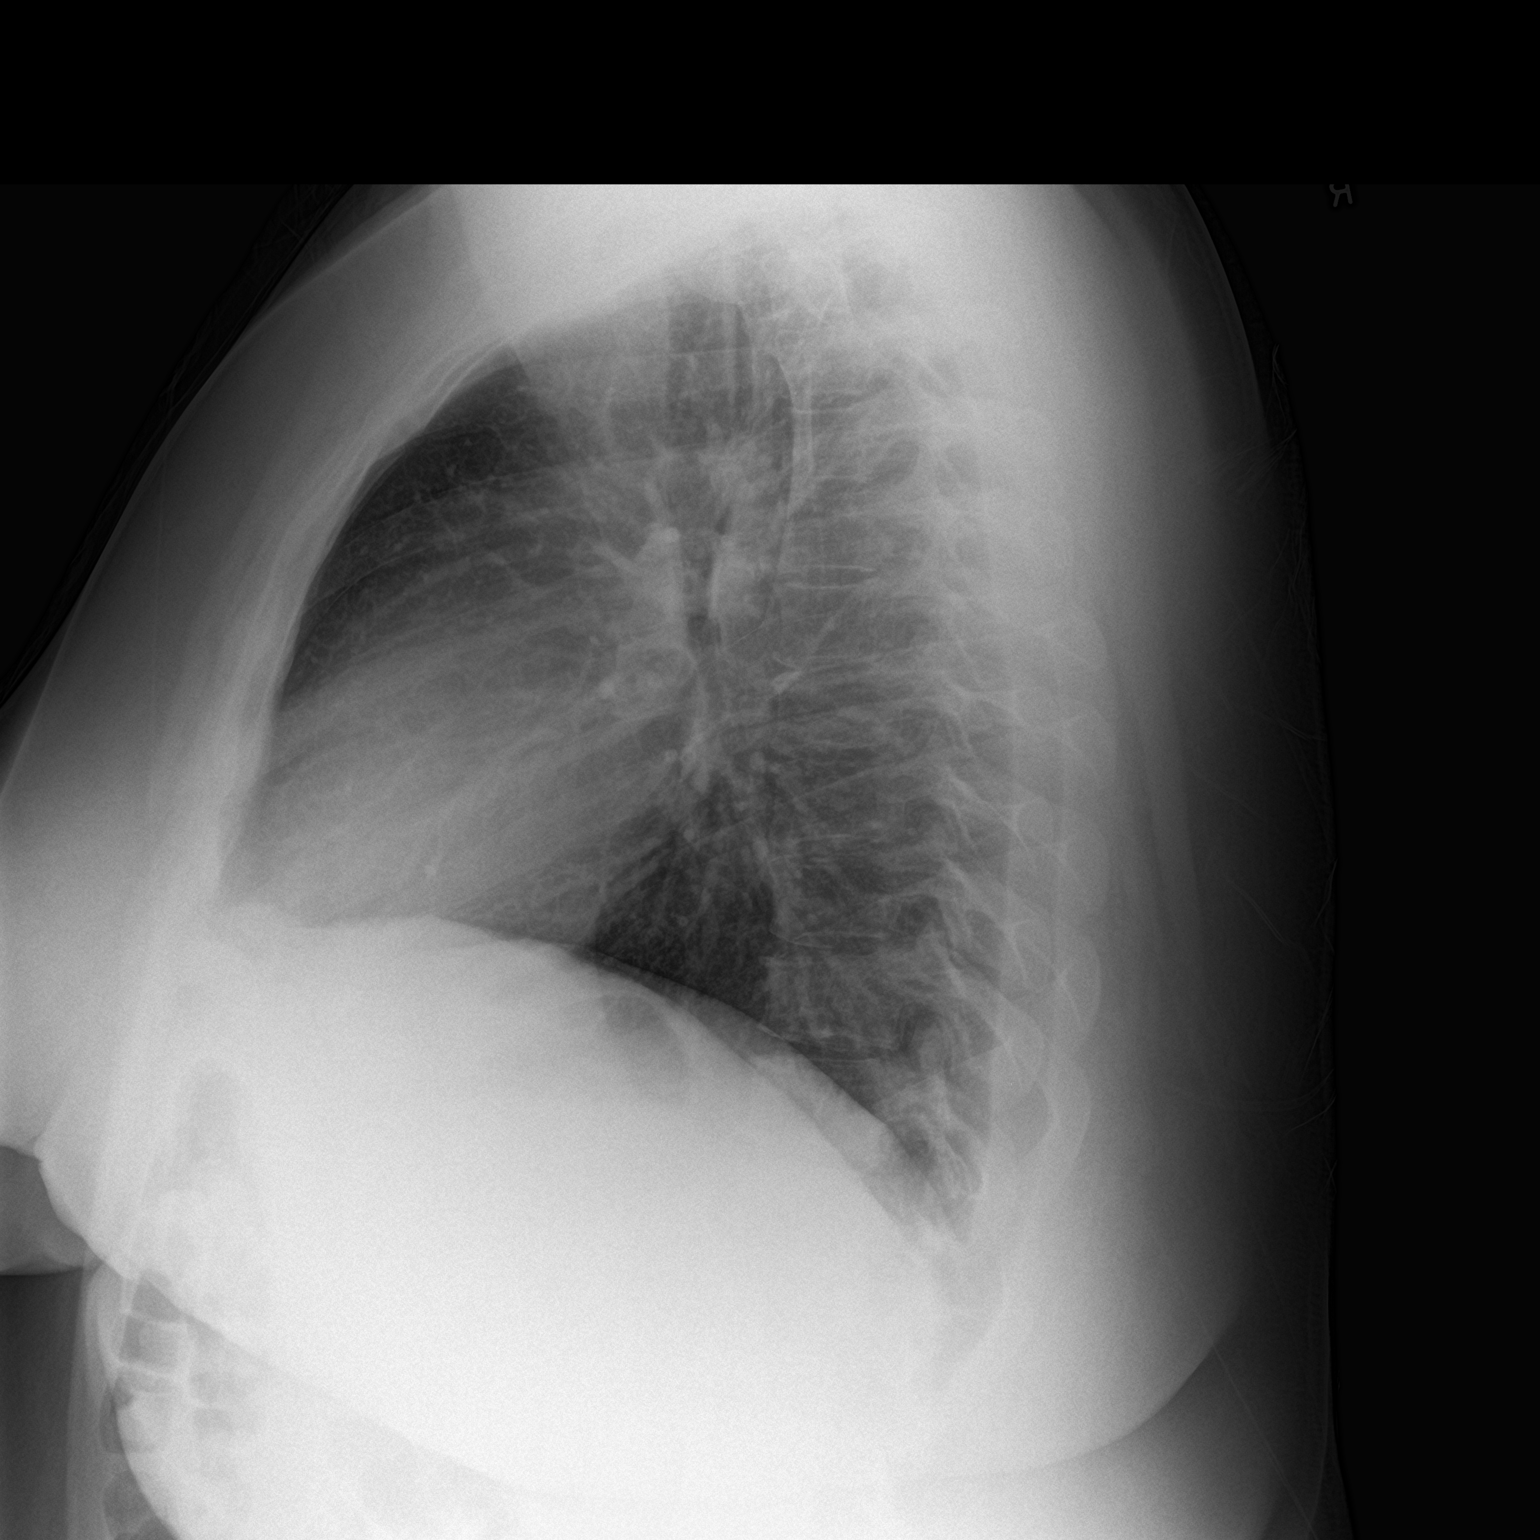

[2 of 2 positions shown; findings below may reference images not displayed]

FINDINGS: Heart size is normal. Mediastinal shadows are normal. The lungs are
clear. No bronchial thickening. No infiltrate, mass, effusion or
collapse. Pulmonary vascularity is normal. No bony abnormality.
IMPRESSION: Normal chest

## 2014-11-22 MED ORDER — IBUPROFEN 800 MG PO TABS
800.0000 mg | ORAL_TABLET | Freq: Once | ORAL | Status: AC
  Administered 2014-11-22: 800 mg via ORAL
  Filled 2014-11-22: qty 1

## 2014-11-22 MED ORDER — IBUPROFEN 800 MG PO TABS: 800.0000 mg | ORAL_TABLET | Freq: Three times a day (TID) | ORAL | Status: DC

## 2014-11-22 MED ORDER — CEPHALEXIN 500 MG PO CAPS
500.0000 mg | ORAL_CAPSULE | Freq: Once | ORAL | Status: AC
  Administered 2014-11-22: 500 mg via ORAL
  Filled 2014-11-22: qty 1

## 2014-11-22 MED ORDER — CEPHALEXIN 500 MG PO CAPS: 500.0000 mg | ORAL_CAPSULE | Freq: Three times a day (TID) | ORAL | Status: DC

## 2014-11-22 NOTE — Discharge Instructions (Signed)
Chest Wall Pain Chest wall pain is pain felt in or around the chest bones and muscles. It may take up to 6 weeks to get better. It may take longer if you are active. Chest wall pain can happen on its own. Other times, things like germs, injury, coughing, or exercise can cause the pain. HOME CARE   Avoid activities that make you tired or cause pain. Try not to use your chest, belly (abdominal), or side muscles. Do not use heavy weights.  Put ice on the sore area.  Put ice in a plastic bag.  Place a towel between your skin and the bag.  Leave the ice on for 15-20 minutes for the first 2 days.  Only take medicine as told by your doctor. GET HELP RIGHT AWAY IF:   You have more pain or are very uncomfortable.  You have a fever.  Your chest pain gets worse.  You have new problems.  You feel sick to your stomach (nauseous) or throw up (vomit).  You start to sweat or feel lightheaded.  You have a cough with mucus (phlegm).  You cough up blood. MAKE SURE YOU:   Understand these instructions.  Will watch your condition.  Will get help right away if you are not doing well or get worse. Document Released: 09/22/2007 Document Revised: 06/28/2011 Document Reviewed: 11/30/2010 [facility] Patient Information 2015 [place], [place]. This information is not intended to replace advice given to you by your health care provider. Make sure you discuss any questions you have with your health care provider.  Urinary Tract Infection Urinary tract infections (UTIs) can develop anywhere along your urinary tract. Your urinary tract is your body's drainage system for removing wastes and extra water. Your urinary tract includes two kidneys, two ureters, a bladder, and a urethra. Your kidneys are a pair of bean-shaped organs. Each kidney is about the size of your fist. They are located below your ribs, one on each side of your spine. CAUSES Infections are caused by microbes, which are microscopic  organisms, including fungi, viruses, and bacteria. These organisms are so small that they can only be seen through a microscope. Bacteria are the microbes that most commonly cause UTIs. SYMPTOMS  Symptoms of UTIs may vary by age and gender of the patient and by the location of the infection. Symptoms in young women typically include a frequent and intense urge to urinate and a painful, burning feeling in the bladder or urethra during urination. Older women and men are more likely to be tired, shaky, and weak and have muscle aches and abdominal pain. A fever may mean the infection is in your kidneys. Other symptoms of a kidney infection include pain in your back or sides below the ribs, nausea, and vomiting. DIAGNOSIS To diagnose a UTI, your caregiver will ask you about your symptoms. Your caregiver also will ask to provide a urine sample. The urine sample will be tested for bacteria and white blood cells. White blood cells are made by your body to help fight infection. TREATMENT  Typically, UTIs can be treated with medication. Because most UTIs are caused by a bacterial infection, they usually can be treated with the use of antibiotics. The choice of antibiotic and length of treatment depend on your symptoms and the type of bacteria causing your infection. HOME CARE INSTRUCTIONS  If you were prescribed antibiotics, take them exactly as your caregiver instructs you. Finish the medication even if you feel better after you have only taken some of  the medication.  Drink enough water and fluids to keep your urine clear or pale yellow.  Avoid caffeine, tea, and carbonated beverages. They tend to irritate your bladder.  Empty your bladder often. Avoid holding urine for long periods of time.  Empty your bladder before and after sexual intercourse.  After a bowel movement, women should cleanse from front to back. Use each tissue only once. SEEK MEDICAL CARE IF:   You have back pain.  You develop a  fever.  Your symptoms do not begin to resolve within 3 days. SEEK IMMEDIATE MEDICAL CARE IF:   You have severe back pain or lower abdominal pain.  You develop chills.  You have nausea or vomiting.  You have continued burning or discomfort with urination. MAKE SURE YOU:   Understand these instructions.  Will watch your condition.  Will get help right away if you are not doing well or get worse. Document Released: 01/13/2005 Document Revised: 10/05/2011 Document Reviewed: 05/14/2011 [facility] Patient Information 2015 [place], [place]. This information is not intended to replace advice given to you by your health care provider. Make sure you discuss any questions you have with your health care provider.

## 2014-11-22 NOTE — ED Provider Notes (Signed)
CSN: [id number]     Arrival date & time 11/22/14  1446 History   First MD Initiated Contact with Patient 11/22/14 1456     Chief Complaint  Patient presents with  . Chest Pain     HPI  Patient presents for evaluation with a multitude of complaints.  Symptoms for about one week. [name] well localized left anterior chest pain that is sharp and occasionally radiates to her shoulder. No ectopy paretic. Not short of breath and no cough. Also describes abdominal pain. After my initial evaluation of chest pain and was literally turning to step out of the room when she stated "UTI forgot until you have been having excruciating abdominal pain all day today". States as this is been intermittent for 2 days that she had "sexual relations with a gentleman on Tuesday". She queries as to whether this might be related. States she was having the left anterior chest pain at the time but "didn't bother me during sex". Denies spotting or bleeding. No dyspareunia. No dysuria frequency. Abdominal pain is diffuse and not present currently.  Does "kind of remember" that she had chills yesterday as well.  History reviewed. No pertinent past medical history. Past Surgical History  Procedure Laterality Date  . Bladder surgery    . Cesarean section     Family History  Problem Relation Age of Onset  . Cancer Other   . Diabetes Other    History  Substance Use Topics  . Smoking status: Current Every Day Smoker -- 0.05 packs/day for 2 years    Types: Cigarettes  . Smokeless tobacco: Never Used  . Alcohol Use: No   OB History    Gravida Para Term Preterm AB TAB SAB Ectopic Multiple Living   Review of Systems  Constitutional: Positive for chills. Negative for fever, diaphoresis, appetite change and fatigue.  HENT: Negative for mouth sores, sore throat and trouble swallowing.   Eyes: Negative for visual disturbance.  Respiratory: Negative for cough, chest tightness, shortness of breath and  wheezing.   Cardiovascular: Positive for chest pain.  Gastrointestinal: Positive for abdominal pain. Negative for nausea, vomiting, diarrhea and abdominal distention.  Endocrine: Negative for polydipsia, polyphagia and polyuria.  Genitourinary: Negative for dysuria, frequency and hematuria.  Musculoskeletal: Negative for gait problem.  Skin: Negative for color change, pallor and rash.  Neurological: Negative for dizziness, syncope, light-headedness and headaches.  Hematological: Does not bruise/bleed easily.  Psychiatric/Behavioral: Negative for behavioral problems and confusion.      Allergies  Review of patient's allergies indicates no known allergies.  Home Medications   Prior to Admission medications   Medication Sig Start Date End Date Taking? Authorizing Provider  Aspirin-Salicylamide-Caffeine (BC HEADACHE) 325-95-16 MG TABS Take 1-2 packets by mouth daily.   Yes Historical Provider, MD  cephALEXin (KEFLEX) 500 MG capsule Take 1 capsule (500 mg total) by mouth 3 (three) times daily. 11/22/14   Rolland Porter, MD  ibuprofen (ADVIL,MOTRIN) 800 MG tablet Take 1 tablet (800 mg total) by mouth 3 (three) times daily. 11/22/14   Rolland Porter, MD   BP 151/74 mmHg  Pulse 81  Temp(Src) 98.4 F (36.9 C) (Oral)  Resp 18  Ht  (1.676 m)  Wt 270 lb (122.471 kg)  BMI 43.60 kg/m2  SpO2 100% Physical Exam  Constitutional: She is oriented to person, place, and time. She appears well-developed and well-nourished. No distress.  HENT:  Head: Normocephalic.  Eyes: Conjunctivae are normal. Pupils are equal, round, and reactive to light. No scleral icterus.  Neck: Normal range of motion. Neck supple. No thyromegaly present.  Cardiovascular: Normal rate and regular rhythm.  Exam reveals no gallop and no friction rub.   No murmur heard. Pulmonary/Chest: Effort normal and breath sounds normal. No respiratory distress. She has no wheezes. She has no rales.    Abdominal: Soft. Bowel sounds are  normal. She exhibits no distension. There is no tenderness. There is no rebound.  Musculoskeletal: Normal range of motion.  Neurological: She is alert and oriented to person, place, and time.  Skin: Skin is warm and dry. No rash noted.  Psychiatric: She has a normal mood and affect. Her behavior is normal.    ED Course  Procedures (including critical care time) Labs Review Labs Reviewed  URINALYSIS, ROUTINE W REFLEX MICROSCOPIC (NOT AT [facility]) - Abnormal; Notable for the following:    APPearance HAZY (*)    Hgb urine dipstick TRACE (*)    Nitrite POSITIVE (*)    Leukocytes, UA MODERATE (*)    All other components within normal limits  COMPREHENSIVE METABOLIC PANEL - Abnormal; Notable for the following:    Glucose, Bld 134 (*)    All other components within normal limits  URINE MICROSCOPIC-ADD ON - Abnormal; Notable for the following:    Squamous Epithelial / LPF FEW (*)    Bacteria, UA MANY (*)    All other components within normal limits  URINE CULTURE  PREGNANCY, URINE  CBC WITH DIFFERENTIAL/PLATELET  TROPONIN I    Imaging Review Dg Chest 2 View  11/22/2014   CLINICAL DATA:  Left-sided chest pain, intermittent over the last week. Some associated nausea.  EXAM: CHEST  2 VIEW  COMPARISON:  None.  FINDINGS: Heart size is normal. Mediastinal shadows are normal. The lungs are clear. No bronchial thickening. No infiltrate, mass, effusion or collapse. Pulmonary vascularity is normal. No bony abnormality.  IMPRESSION: Normal chest   Electronically Signed   By: Paulina Fusi M.D.   On: 11/22/2014 16:03     EKG Interpretation None      MDM   Final diagnoses:  Chest pain  UTI (lower urinary tract infection)    Keflex for UTI.  Ibuprofen for chest wall pain.    Rolland Porter, MD 11/22/14 [phone]

## 2014-11-22 NOTE — ED Notes (Signed)
Pt states that she has been having chest pain, nausea, abdominal pain, and generalized weakness for over a week now.

## 2014-11-22 NOTE — ED Notes (Signed)
Returned from radiology at this time 

## 2014-11-25 LAB — URINE CULTURE
Culture: >=100000
SPECIAL REQUESTS: NORMAL

## 2014-11-26 ENCOUNTER — Telehealth (HOSPITAL_COMMUNITY): Payer: Self-pay | Admitting: Emergency Medicine

## 2014-11-26 NOTE — Telephone Encounter (Signed)
Post ED Visit - Positive Culture Follow-up  Culture report reviewed by antimicrobial stewardship pharmacist:  Wes Dulaney, Pharm.D., BCPS  Celedonio Miyamoto, Pharm.D., BCPS  Georgina Pillion, Pharm.D., BCPS  [place], [place].D., BCPS, AAHIVP  Estella Husk, Pharm.D., BCPS, AAHIVP  Casilda Carls, Pharm.D., BCPS  Positive Urine culture Treated with Cephalexin, organism sensitive to the same and no further patient follow-up is required at this time.  Jiles Harold 11/26/2014, 11:52 AM

## 2015-02-07 ENCOUNTER — Encounter (HOSPITAL_COMMUNITY): Payer: Self-pay | Admitting: Emergency Medicine

## 2015-02-07 ENCOUNTER — Emergency Department (HOSPITAL_COMMUNITY)
Admission: EM | Admit: 2015-02-07 | Discharge: 2015-02-07 | Disposition: A | Discharge status: Home / Self Care | Payer: Self-pay | Attending: Emergency Medicine | Admitting: Emergency Medicine

## 2015-02-07 DIAGNOSIS — R51 Headache: Secondary | ICD-10-CM | POA: Insufficient documentation

## 2015-02-07 DIAGNOSIS — Z720 Tobacco use: Secondary | ICD-10-CM | POA: Insufficient documentation

## 2015-02-07 DIAGNOSIS — K029 Dental caries, unspecified: Secondary | ICD-10-CM | POA: Insufficient documentation

## 2015-02-07 DIAGNOSIS — K0889 Other specified disorders of teeth and supporting structures: Secondary | ICD-10-CM | POA: Insufficient documentation

## 2015-02-07 DIAGNOSIS — Z79899 Other long term (current) drug therapy: Secondary | ICD-10-CM | POA: Insufficient documentation

## 2015-02-07 MED ORDER — AMOXICILLIN 500 MG PO CAPS: 500.0000 mg | ORAL_CAPSULE | Freq: Three times a day (TID) | ORAL | Status: DC

## 2015-02-07 MED ORDER — AMOXICILLIN 250 MG PO CAPS
500.0000 mg | ORAL_CAPSULE | Freq: Once | ORAL | Status: AC
  Administered 2015-02-07: 500 mg via ORAL
  Filled 2015-02-07: qty 2

## 2015-02-07 MED ORDER — TRAMADOL HCL 50 MG PO TABS: 50.0000 mg | ORAL_TABLET | Freq: Four times a day (QID) | ORAL | Status: DC | PRN

## 2015-02-07 NOTE — ED Notes (Signed)
Pt states she has been taking ibuprofen for pain with no relief.

## 2015-02-07 NOTE — Discharge Instructions (Signed)
It is important that you see a dentist systems possible. Please use Amoxil and 600 mg of ibuprofen 3 times daily. Please take these with food. Use Ultram every 6 hours if needed for more severe pain. Ultram may cause drowsiness, please use this medication with caution.

## 2015-02-07 NOTE — ED Provider Notes (Signed)
CSN: [id number]     Arrival date & time 02/07/15  1226 History   First MD Initiated Contact with Patient 02/07/15 1333     Chief Complaint  Patient presents with  . Dental Pain     (Consider location/radiation/quality/duration/timing/severity/associated sxs/prior Treatment) Patient is a 29 y.o. female presenting with tooth pain. The history is provided by the patient.  Dental Pain Location:  Generalized Quality:  Aching and throbbing Severity:  Moderate Onset quality:  Gradual Duration:  2 months Timing:  Intermittent Progression:  Worsening Chronicity:  Chronic Context: dental caries   Relieved by:  Nothing Worsened by:  Cold food/drink Ineffective treatments:  NSAIDs Associated symptoms: gum swelling and headaches   Associated symptoms: no fever and no trismus   Risk factors: lack of dental care   Risk factors: no diabetes     History reviewed. No pertinent past medical history. Past Surgical History  Procedure Laterality Date  . Bladder surgery    . Cesarean section     Family History  Problem Relation Age of Onset  . Cancer Other   . Diabetes Other    Social History  Substance Use Topics  . Smoking status: Current Every Day Smoker -- 0.50 packs/day for 2 years    Types: Cigarettes  . Smokeless tobacco: Never Used  . Alcohol Use: No   OB History    Gravida Para Term Preterm AB TAB SAB Ectopic Multiple Living   3 3 3       3      Review of Systems  Constitutional: Negative for fever.  HENT: Positive for dental problem. Negative for trouble swallowing.   Respiratory: Negative for chest tightness and wheezing.   Neurological: Positive for headaches.  All other systems reviewed and are negative.     Allergies  Review of patient's allergies indicates no known allergies.  Home Medications   Prior to Admission medications   Medication Sig Start Date End Date Taking? Authorizing Provider  Aspirin-Salicylamide-Caffeine (BC HEADACHE) 325-95-16 MG TABS  Take 1-2 packets by mouth daily.   Yes Historical Provider, MD  cephALEXin (KEFLEX) 500 MG capsule Take 1 capsule (500 mg total) by mouth 3 (three) times daily. Patient not taking: Reported on 02/07/2015 11/22/14   Rolland PorterMark James, MD  ibuprofen (ADVIL,MOTRIN) 800 MG tablet Take 1 tablet (800 mg total) by mouth 3 (three) times daily. Patient not taking: Reported on 02/07/2015 11/22/14   Rolland PorterMark James, MD   BP 142/88 mmHg  Pulse 74  Temp(Src) 98.3 F (36.8 C) (Oral)  Resp 18  Ht 5\' 6"  (1.676 m)  Wt 260 lb (117.935 kg)  BMI 41.99 kg/m2  SpO2 100%  LMP 02/05/2015 (Exact Date) Physical Exam  Constitutional: She is oriented to person, place, and time. She appears well-developed and well-nourished.  Non-toxic appearance.  HENT:  Head: Normocephalic.  Right Ear: Tympanic membrane and external ear normal.  Left Ear: Tympanic membrane and external ear normal.  Multiple dental  Caries. Some swelling of the gums. No airway compromise. No swelling under the tongue.  Eyes: EOM and lids are normal. Pupils are equal, round, and reactive to light.  Neck: Normal range of motion. Neck supple. Carotid bruit is not present.  Cardiovascular: Normal rate, regular rhythm, normal heart sounds, intact distal pulses and normal pulses.   Pulmonary/Chest: Breath sounds normal. No respiratory distress.  Abdominal: Soft. Bowel sounds are normal. There is no tenderness. There is no guarding.  Musculoskeletal: Normal range of motion.  Lymphadenopathy:  Head (right side): No submandibular adenopathy present.       Head (left side): No submandibular adenopathy present.    She has no cervical adenopathy.  Neurological: She is alert and oriented to person, place, and time. She has normal strength. No cranial nerve deficit or sensory deficit.  Skin: Skin is warm and dry.  Psychiatric: She has a normal mood and affect. Her speech is normal.  Nursing note and vitals reviewed.   ED Course  Procedures (including critical  care time) Labs Review Labs Reviewed - No data to display  Imaging Review No results found. I have personally reviewed and evaluated these images and lab results as part of my medical decision-making.   EKG Interpretation None      MDM  Multiple dental caries and pain of multiple areas of the mouth. Vital signs stable. Pt speech is clear. No signs of Ludwig's angina. Rx for amoxil and ultram given to the patient. Pt advised to see a dentist as soon as possible.   Final diagnoses:  Pain, dental    **I have reviewed nursing notes, vital signs, and all appropriate lab and imaging results for this patient.Ivery Quale, PA-C 02/10/15 1011  Zadie Rhine, MD 02/10/15 1520

## 2015-02-07 NOTE — ED Notes (Signed)
[name] at bedside 

## 2015-02-07 NOTE — ED Notes (Signed)
Pt stated bneen having pain off and on x 2 -3 months - however pain has been continuous x1 week and she states she has rear rt wisom tooth and sharp pain 2 front teeth

## 2015-04-10 ENCOUNTER — Emergency Department (HOSPITAL_COMMUNITY)
Admission: EM | Admit: 2015-04-10 | Discharge: 2015-04-10 | Disposition: A | Discharge status: Home / Self Care | Payer: Self-pay | Attending: Emergency Medicine | Admitting: Emergency Medicine

## 2015-04-10 ENCOUNTER — Encounter (HOSPITAL_COMMUNITY): Payer: Self-pay | Admitting: *Deleted

## 2015-04-10 DIAGNOSIS — Z3202 Encounter for pregnancy test, result negative: Secondary | ICD-10-CM | POA: Insufficient documentation

## 2015-04-10 DIAGNOSIS — Z792 Long term (current) use of antibiotics: Secondary | ICD-10-CM | POA: Insufficient documentation

## 2015-04-10 DIAGNOSIS — N390 Urinary tract infection, site not specified: Secondary | ICD-10-CM | POA: Insufficient documentation

## 2015-04-10 DIAGNOSIS — Z79899 Other long term (current) drug therapy: Secondary | ICD-10-CM | POA: Insufficient documentation

## 2015-04-10 DIAGNOSIS — F17210 Nicotine dependence, cigarettes, uncomplicated: Secondary | ICD-10-CM | POA: Insufficient documentation

## 2015-04-10 LAB — URINALYSIS, ROUTINE W REFLEX MICROSCOPIC
Bilirubin Urine: NEGATIVE
Glucose, UA: NEGATIVE mg/dL
KETONES UR: NEGATIVE mg/dL
Nitrite: POSITIVE — AB
PH: 7.5 (ref 5.0–8.0)
PROTEIN: 100 mg/dL — AB
Specific Gravity, Urine: 1.01 (ref 1.005–1.030)

## 2015-04-10 LAB — URINE MICROSCOPIC-ADD ON

## 2015-04-10 LAB — PREGNANCY, URINE: PREG TEST UR: NEGATIVE

## 2015-04-10 MED ORDER — STERILE WATER FOR INJECTION IJ SOLN
INTRAMUSCULAR | Status: AC
  Administered 2015-04-10: 10 mL
  Filled 2015-04-10: qty 10

## 2015-04-10 MED ORDER — CEPHALEXIN 500 MG PO CAPS: 500.0000 mg | ORAL_CAPSULE | Freq: Four times a day (QID) | ORAL | Status: DC

## 2015-04-10 MED ORDER — CEFTRIAXONE SODIUM 1 G IJ SOLR
1.0000 g | Freq: Once | INTRAMUSCULAR | Status: AC
  Administered 2015-04-10: 1 g via INTRAMUSCULAR
  Filled 2015-04-10: qty 10

## 2015-04-10 MED ORDER — HYDROCODONE-ACETAMINOPHEN 5-325 MG PO TABS: 1.0000 | ORAL_TABLET | ORAL | Status: DC | PRN

## 2015-04-10 MED ORDER — ACETAMINOPHEN 500 MG PO TABS
1000.0000 mg | ORAL_TABLET | Freq: Once | ORAL | Status: AC
  Administered 2015-04-10: 1000 mg via ORAL
  Filled 2015-04-10: qty 2

## 2015-04-10 MED ORDER — PHENAZOPYRIDINE HCL 100 MG PO TABS
100.0000 mg | ORAL_TABLET | Freq: Once | ORAL | Status: AC
  Administered 2015-04-10: 100 mg via ORAL
  Filled 2015-04-10: qty 1

## 2015-04-10 NOTE — Discharge Instructions (Signed)
Please see MD at [facility] for evaluation of  Recurrent UTI's. Keflex four times daily until all taken. Use tylenol for mild pain. Use norco for more severe pain. Urinary Tract Infection Urinary tract infections (UTIs) can develop anywhere along your urinary tract. Your urinary tract is your body's drainage system for removing wastes and extra water. Your urinary tract includes two kidneys, two ureters, a bladder, and a urethra. Your kidneys are a pair of bean-shaped organs. Each kidney is about the size of your fist. They are located below your ribs, one on each side of your spine. CAUSES Infections are caused by microbes, which are microscopic organisms, including fungi, viruses, and bacteria. These organisms are so small that they can only be seen through a microscope. Bacteria are the microbes that most commonly cause UTIs. SYMPTOMS  Symptoms of UTIs may vary by age and gender of the patient and by the location of the infection. Symptoms in young women typically include a frequent and intense urge to urinate and a painful, burning feeling in the bladder or urethra during urination. Older women and men are more likely to be tired, shaky, and weak and have muscle aches and abdominal pain. A fever may mean the infection is in your kidneys. Other symptoms of a kidney infection include pain in your back or sides below the ribs, nausea, and vomiting. DIAGNOSIS To diagnose a UTI, your caregiver will ask you about your symptoms. Your caregiver will also ask you to provide a urine sample. The urine sample will be tested for bacteria and white blood cells. White blood cells are made by your body to help fight infection. TREATMENT  Typically, UTIs can be treated with medication. Because most UTIs are caused by a bacterial infection, they usually can be treated with the use of antibiotics. The choice of antibiotic and length of treatment depend on your symptoms and the type of bacteria causing your  infection. HOME CARE INSTRUCTIONS  If you were prescribed antibiotics, take them exactly as your caregiver instructs you. Finish the medication even if you feel better after you have only taken some of the medication.  Drink enough water and fluids to keep your urine clear or pale yellow.  Avoid caffeine, tea, and carbonated beverages. They tend to irritate your bladder.  Empty your bladder often. Avoid holding urine for long periods of time.  Empty your bladder before and after sexual intercourse.  After a bowel movement, women should cleanse from front to back. Use each tissue only once. SEEK MEDICAL CARE IF:   You have back pain.  You develop a fever.  Your symptoms do not begin to resolve within 3 days. SEEK IMMEDIATE MEDICAL CARE IF:   You have severe back pain or lower abdominal pain.  You develop chills.  You have nausea or vomiting.  You have continued burning or discomfort with urination. MAKE SURE YOU:   Understand these instructions.  Will watch your condition.  Will get help right away if you are not doing well or get worse.   This information is not intended to replace advice given to you by your health care provider. Make sure you discuss any questions you have with your health care provider.   Document Released: 01/13/2005 Document Revised: 12/25/2014 Document Reviewed: 05/14/2011 Elsevier Interactive Patient Education [employer]2016 Elsevier Inc.

## 2015-04-10 NOTE — ED Provider Notes (Signed)
CSN: [id number]     Arrival date & time 04/10/15  [id number] History   First MD Initiated Contact with Patient 04/10/15 1004     Chief Complaint  Patient presents with  . Dysuria     (Consider location/radiation/quality/duration/timing/severity/associated sxs/prior Treatment) Patient is a 29 y.o. female presenting with dysuria. The history is provided by the patient.  Dysuria Pain quality:  Sharp (pressure) Pain severity:  Moderate Onset quality:  Gradual Duration:  10 days Timing:  Intermittent Progression:  Worsening Chronicity:  New Recent urinary tract infections: no   Relieved by:  Nothing Worsened by:  Nothing tried Urinary symptoms: frequent urination and incontinence   Associated symptoms: nausea   Associated symptoms: no fever and no vomiting   Risk factors comment:  Hx of reconstructive surgery   History reviewed. No pertinent past medical history. Past Surgical History  Procedure Laterality Date  . Bladder surgery    . Cesarean section     Family History  Problem Relation Age of Onset  . Cancer Other   . Diabetes Other    Social History  Substance Use Topics  . Smoking status: Current Every Day Smoker -- 0.50 packs/day for 2 years    Types: Cigarettes  . Smokeless tobacco: Never Used  . Alcohol Use: No   OB History    Gravida Para Term Preterm AB TAB SAB Ectopic Multiple Living   3 3 3       3      Review of Systems  Constitutional: Negative for fever.  Gastrointestinal: Positive for nausea. Negative for vomiting.  Genitourinary: Positive for dysuria.  All other systems reviewed and are negative.     Allergies  Review of patient's allergies indicates no known allergies.  Home Medications   Prior to Admission medications   Medication Sig Start Date End Date Taking? Authorizing Provider  amoxicillin (AMOXIL) 500 MG capsule Take 1 capsule (500 mg total) by mouth 3 (three) times daily. 02/07/15   Ivery QualeHobson Zaina Jenkin, PA-C  Aspirin-Salicylamide-Caffeine  (BC HEADACHE) 325-95-16 MG TABS Take 1-2 packets by mouth daily.    Historical Provider, MD  cephALEXin (KEFLEX) 500 MG capsule Take 1 capsule (500 mg total) by mouth 3 (three) times daily. Patient not taking: Reported on 02/07/2015 11/22/14   Rolland PorterMark James, MD  ibuprofen (ADVIL,MOTRIN) 800 MG tablet Take 1 tablet (800 mg total) by mouth 3 (three) times daily. Patient not taking: Reported on 02/07/2015 11/22/14   Rolland PorterMark James, MD  traMADol (ULTRAM) 50 MG tablet Take 1 tablet (50 mg total) by mouth every 6 (six) hours as needed. 02/07/15   Ivery QualeHobson Tamilyn Lupien, PA-C   BP 161/92 mmHg  Pulse 68  Temp(Src) 97.8 F (36.6 C) (Oral)  Resp 16  Ht 5\' 6"  (1.676 m)  Wt 115.667 kg  BMI 41.18 kg/m2  SpO2 98%  LMP 03/14/2015 Physical Exam  Constitutional: She is oriented to person, place, and time. She appears well-developed and well-nourished.  Non-toxic appearance.  HENT:  Head: Normocephalic.  Right Ear: Tympanic membrane and external ear normal.  Left Ear: Tympanic membrane and external ear normal.  Eyes: EOM and lids are normal. Pupils are equal, round, and reactive to light.  Neck: Normal range of motion. Neck supple. Carotid bruit is not present.  Cardiovascular: Normal rate, regular rhythm, normal heart sounds, intact distal pulses and normal pulses.   Pulmonary/Chest: Breath sounds normal. No respiratory distress.  Abdominal: Soft. Bowel sounds are normal. There is no tenderness. There is CVA tenderness. There is no guarding.  Discomfort to the mid lower abd. No mass or distention.  Musculoskeletal: Normal range of motion.  Lymphadenopathy:       Head (right side): No submandibular adenopathy present.       Head (left side): No submandibular adenopathy present.    She has no cervical adenopathy.  Neurological: She is alert and oriented to person, place, and time. She has normal strength. No cranial nerve deficit or sensory deficit.  Skin: Skin is warm and dry.  Psychiatric: She has a normal mood and  affect. Her speech is normal.  Nursing note and vitals reviewed.   ED Course  Procedures (including critical care time) Labs Review Labs Reviewed  URINALYSIS, ROUTINE W REFLEX MICROSCOPIC (NOT AT [facility]) - Abnormal; Notable for the following:    Hgb urine dipstick LARGE (*)    Protein, ur 100 (*)    Nitrite POSITIVE (*)    Leukocytes, UA MODERATE (*)    All other components within normal limits  URINE MICROSCOPIC-ADD ON - Abnormal; Notable for the following:    Squamous Epithelial / LPF 0-5 (*)    Bacteria, UA MANY (*)    All other components within normal limits  PREGNANCY, URINE    Imaging Review No results found. I have personally reviewed and evaluated these images and lab results as part of my medical decision-making.   EKG Interpretation None      MDM  Vital signs stable. UA consistent with UTI. Pt treated with Rocephin in the ED. Rx for keflex given to pt. Pt to have urine rechecked in 7 to 10 days. She will return sooner if any changes or problem.   Final diagnoses:  UTI (lower urinary tract infection)    **I have reviewed nursing notes, vital signs, and all appropriate lab and imaging results for this patient.Ivery Quale, PA-C 04/14/15 1650  Loren Racer, MD 04/23/15 [phone]

## 2015-04-10 NOTE — ED Notes (Signed)
Pt states pressure to lower abdomen with worsening pain with urination, described as stabbing pain. Symptoms x 1.5 weeks.

## 2015-04-13 LAB — URINE CULTURE

## 2015-04-15 ENCOUNTER — Telehealth (HOSPITAL_COMMUNITY): Payer: Self-pay

## 2015-04-15 NOTE — Telephone Encounter (Signed)
[facility] number not accepting calls.  [facility]

## 2015-05-01 ENCOUNTER — Telehealth (HOSPITAL_BASED_OUTPATIENT_CLINIC_OR_DEPARTMENT_OTHER): Payer: Self-pay | Admitting: Emergency Medicine

## 2015-05-01 NOTE — Telephone Encounter (Signed)
Lost to followup, letter returned, no forwarding address

## 2015-05-17 ENCOUNTER — Telehealth (HOSPITAL_COMMUNITY): Payer: Self-pay

## 2015-05-17 NOTE — Telephone Encounter (Signed)
Unable to contact pt by mail or telephone. Unable to communicate lab results or treatment changes. 

## 2015-07-18 ENCOUNTER — Encounter (HOSPITAL_COMMUNITY): Payer: Self-pay

## 2015-07-18 ENCOUNTER — Emergency Department (HOSPITAL_COMMUNITY)
Admission: EM | Admit: 2015-07-18 | Discharge: 2015-07-18 | Disposition: A | Discharge status: Home / Self Care | Payer: Self-pay | Attending: Dermatology | Admitting: Dermatology

## 2015-07-18 DIAGNOSIS — R300 Dysuria: Secondary | ICD-10-CM | POA: Insufficient documentation

## 2015-07-18 DIAGNOSIS — R0981 Nasal congestion: Secondary | ICD-10-CM | POA: Insufficient documentation

## 2015-07-18 DIAGNOSIS — Z5321 Procedure and treatment not carried out due to patient leaving prior to being seen by health care provider: Secondary | ICD-10-CM | POA: Insufficient documentation

## 2015-07-18 DIAGNOSIS — F17210 Nicotine dependence, cigarettes, uncomplicated: Secondary | ICD-10-CM | POA: Insufficient documentation

## 2015-07-18 LAB — URINALYSIS, ROUTINE W REFLEX MICROSCOPIC
Bilirubin Urine: NEGATIVE
Glucose, UA: NEGATIVE mg/dL
KETONES UR: NEGATIVE mg/dL
NITRITE: POSITIVE — AB
Protein, ur: NEGATIVE mg/dL
SPECIFIC GRAVITY, URINE: 1.01 (ref 1.005–1.030)
pH: 7.5 (ref 5.0–8.0)

## 2015-07-18 LAB — URINE MICROSCOPIC-ADD ON

## 2015-07-18 LAB — PREGNANCY, URINE: Preg Test, Ur: NEGATIVE

## 2015-07-18 NOTE — ED Notes (Signed)
Pt reports son was dx with flu last week. Pt with nasal congestion and pressure. Also reports burning with urination for one week. Cloudy and has odor. Pt reports taking OTC meds for uti

## 2015-07-19 ENCOUNTER — Encounter (HOSPITAL_COMMUNITY): Payer: Self-pay | Admitting: *Deleted

## 2015-07-19 ENCOUNTER — Emergency Department (HOSPITAL_COMMUNITY)
Admission: EM | Admit: 2015-07-19 | Discharge: 2015-07-19 | Disposition: A | Discharge status: Home / Self Care | Payer: Self-pay | Attending: Emergency Medicine | Admitting: Emergency Medicine

## 2015-07-19 DIAGNOSIS — N390 Urinary tract infection, site not specified: Secondary | ICD-10-CM | POA: Insufficient documentation

## 2015-07-19 DIAGNOSIS — Z7982 Long term (current) use of aspirin: Secondary | ICD-10-CM | POA: Insufficient documentation

## 2015-07-19 DIAGNOSIS — J069 Acute upper respiratory infection, unspecified: Secondary | ICD-10-CM | POA: Insufficient documentation

## 2015-07-19 DIAGNOSIS — F17210 Nicotine dependence, cigarettes, uncomplicated: Secondary | ICD-10-CM | POA: Insufficient documentation

## 2015-07-19 LAB — URINALYSIS, ROUTINE W REFLEX MICROSCOPIC
BILIRUBIN URINE: NEGATIVE
Glucose, UA: NEGATIVE mg/dL
Ketones, ur: NEGATIVE mg/dL
NITRITE: POSITIVE — AB
Protein, ur: NEGATIVE mg/dL
SPECIFIC GRAVITY, URINE: 1.025 (ref 1.005–1.030)
pH: 6 (ref 5.0–8.0)

## 2015-07-19 LAB — URINE MICROSCOPIC-ADD ON

## 2015-07-19 LAB — PREGNANCY, URINE: PREG TEST UR: NEGATIVE

## 2015-07-19 MED ORDER — GUAIFENESIN-CODEINE 100-10 MG/5ML PO SYRP: 10.0000 mL | ORAL_SOLUTION | Freq: Three times a day (TID) | ORAL | Status: DC | PRN

## 2015-07-19 MED ORDER — CEPHALEXIN 500 MG PO CAPS: 500.0000 mg | ORAL_CAPSULE | Freq: Four times a day (QID) | ORAL | Status: DC

## 2015-07-19 MED ORDER — IBUPROFEN 800 MG PO TABS
800.0000 mg | ORAL_TABLET | Freq: Three times a day (TID) | ORAL | Status: DC
Start: 2015-07-19 — End: 2016-10-22

## 2015-07-19 NOTE — ED Notes (Signed)
Pt comes in with cough and congestion. States son was recently dx with the flu.   In addition, pt is having painful urination and lower back pain.   VS stable in triage.

## 2015-07-19 NOTE — ED Provider Notes (Signed)
CSN: [id number]     Arrival date & time 07/19/15  1028 History   First MD Initiated Contact with Patient 07/19/15 1043     Chief Complaint  Patient presents with  . Cough  . Dysuria     (Consider location/radiation/quality/duration/timing/severity/associated sxs/prior Treatment) HPI   [name] is a 30 y.o. female who presents to the Emergency Department complaining of cough and nasal congestion and sinus pressure. She states her symptoms have been present for 1 week. Cough has been productive of clear sputum. She is not taking anything for her symptoms. She also states that her son was recently diagnosed with "the flu" last week. She also requests evaluation of burning with urination for 1 week states her urine has been cloudy and has a foul odor. Some increased urinary frequency as well. She's been taking over-the-counter Azo for her symptoms without relief. She denies fever, vomiting, flank pain, abdominal pain, chest pain and shortness of breath.  She does admit to frequent UTIs and states her dysuria symptoms are similar to previous.  History reviewed. No pertinent past medical history. Past Surgical History  Procedure Laterality Date  . Bladder surgery    . Cesarean section     Family History  Problem Relation Age of Onset  . Cancer Other   . Diabetes Other    Social History  Substance Use Topics  . Smoking status: Current Every Day Smoker -- 0.50 packs/day for 2 years    Types: Cigarettes  . Smokeless tobacco: Never Used  . Alcohol Use: No   OB History    Gravida Para Term Preterm AB TAB SAB Ectopic Multiple Living   3 3 3       3      Review of Systems  Constitutional: Negative for fever, chills, activity change and appetite change.  HENT: Positive for congestion, rhinorrhea and sinus pressure. Negative for facial swelling, sore throat and trouble swallowing.   Eyes: Negative for visual disturbance.  Respiratory: Positive for cough. Negative for chest tightness,  shortness of breath, wheezing and stridor.   Gastrointestinal: Negative for nausea, vomiting and abdominal pain.  Genitourinary: Positive for dysuria and frequency. Negative for hematuria, flank pain, decreased urine volume, vaginal discharge, difficulty urinating and menstrual problem.  Musculoskeletal: Negative for back pain, neck pain and neck stiffness.  Skin: Negative.   Neurological: Negative for dizziness, syncope, weakness, numbness and headaches.  Hematological: Negative for adenopathy.  Psychiatric/Behavioral: Negative for confusion.  All other systems reviewed and are negative.     Allergies  Review of patient's allergies indicates no known allergies.  Home Medications   Prior to Admission medications   Medication Sig Start Date End Date Taking? Authorizing Provider  Aspirin-Salicylamide-Caffeine (BC HEADACHE) 325-95-16 MG TABS Take 1-2 packets by mouth daily.   Yes Historical Provider, MD   BP 161/92 mmHg  Pulse 72  Temp(Src) 98.3 F (36.8 C) (Oral)  Resp 16  Ht 5\' 6"  (1.676 m)  Wt 114.76 kg  BMI 40.85 kg/m2  SpO2 100%  LMP 06/17/2015 Physical Exam  Constitutional: She is oriented to person, place, and time. She appears well-developed and well-nourished. No distress.  HENT:  Head: Normocephalic and atraumatic.  Right Ear: Tympanic membrane and ear canal normal.  Left Ear: Tympanic membrane and ear canal normal.  Nose: Mucosal edema and rhinorrhea present.  Mouth/Throat: Uvula is midline and mucous membranes are normal. No trismus in the jaw. No uvula swelling. No oropharyngeal exudate, posterior oropharyngeal edema, posterior oropharyngeal erythema or tonsillar  abscesses.  Eyes: Conjunctivae are normal.  Neck: Normal range of motion and phonation normal. Neck supple.  Cardiovascular: Normal rate, regular rhythm, normal heart sounds and intact distal pulses.   No murmur heard. Pulmonary/Chest: Effort normal and breath sounds normal. No respiratory distress. She  has no wheezes. She has no rales.  Abdominal: Soft. Normal appearance. She exhibits no distension. There is no tenderness. There is no rebound, no guarding and no CVA tenderness.  Musculoskeletal: Normal range of motion. She exhibits no edema.  Lymphadenopathy:    She has no cervical adenopathy.  Neurological: She is alert and oriented to person, place, and time. She exhibits normal muscle tone. Coordination normal.  Skin: Skin is warm and dry.  Nursing note and vitals reviewed.   ED Course  Procedures (including critical care time) Labs Review Labs Reviewed  URINALYSIS, ROUTINE W REFLEX MICROSCOPIC (NOT AT [facility]) - Abnormal; Notable for the following:    APPearance CLOUDY (*)    Hgb urine dipstick MODERATE (*)    Nitrite POSITIVE (*)    Leukocytes, UA SMALL (*)    All other components within normal limits  URINE MICROSCOPIC-ADD ON - Abnormal; Notable for the following:    Squamous Epithelial / LPF 6-30 (*)    Bacteria, UA MANY (*)    All other components within normal limits  URINE CULTURE  PREGNANCY, URINE    Imaging Review No results found. I have personally reviewed and evaluated these images and lab results as part of my medical decision-making.   EKG Interpretation None      MDM   Final diagnoses:  UTI (urinary tract infection), uncomplicated  URI (upper respiratory infection)    Urine culture from 04/08/2015 grew Morganella MORGANII.    Patient is well-appearing. Vital signs are stable. Nontoxic. No fever, CVA tenderness, or vomiting to suggest pyelonephritis. Often congestion likely related to URI. Urinalysis shows many bacteria and positive nitrites, culture is pending. I will prescribe Keflex and Robitussin-AC for cough relief patient agrees to ibuprofen for pain and close follow-up with her PCP advised to return for any worsening symptoms.  Pauline Aus, PA-C 07/20/15 1620  Glynn Octave, MD 07/20/15 1626

## 2015-07-19 NOTE — Discharge Instructions (Signed)
Cough, Adult A cough helps to clear your throat and lungs. A cough may last only 2-3 weeks (acute), or it may last longer than 8 weeks (chronic). Many different things can cause a cough. A cough may be a sign of an illness or another medical condition. HOME CARE  Pay attention to any changes in your cough.  Take medicines only as told by your doctor.  If you were prescribed an antibiotic medicine, take it as told by your doctor. Do not stop taking it even if you start to feel better.  Talk with your doctor before you try using a cough medicine.  Drink enough fluid to keep your pee (urine) clear or pale yellow.  If the air is dry, use a cold steam vaporizer or humidifier in your home.  Stay away from things that make you cough at work or at home.  If your cough is worse at night, try using extra pillows to raise your head up higher while you sleep.  Do not smoke, and try not to be around smoke. If you need help quitting, ask your doctor.  Do not have caffeine.  Do not drink alcohol.  Rest as needed. GET HELP IF:  You have new problems (symptoms).  You cough up yellow fluid (pus).  Your cough does not get better after 2-3 weeks, or your cough gets worse.  Medicine does not help your cough and you are not sleeping well.  You have pain that gets worse or pain that is not helped with medicine.  You have a fever.  You are losing weight and you do not know why.  You have night sweats. GET HELP RIGHT AWAY IF:  You cough up blood.  You have trouble breathing.  Your heartbeat is very fast.   This information is not intended to replace advice given to you by your health care provider. Make sure you discuss any questions you have with your health care provider.   Document Released: 12/17/2010 Document Revised: 12/25/2014 Document Reviewed: 06/12/2014 Elsevier Interactive Patient Education 2016 Else[employer] Inc.  Urinary Tract Infection A urinary tract infection (UTI) can  occur any place along the urinary tract. The tract includes the kidneys, ureters, bladder, and urethra. A type of germ called bacteria often causes a UTI. UTIs are often helped with antibiotic medicine.  HOME CARE   If given, take antibiotics as told by your doctor. Finish them even if you start to feel better.  Drink enough fluids to keep your pee (urine) clear or pale yellow.  Avoid tea, drinks with caffeine, and bubbly (carbonated) drinks.  Pee often. Avoid holding your pee in for a long time.  Pee before and after having sex (intercourse).  Wipe from front to back after you poop (bowel movement) if you are a woman. Use each tissue only once. GET HELP RIGHT AWAY IF:   You have back pain.  You have lower belly (abdominal) pain.  You have chills.  You feel sick to your stomach (nauseous).  You throw up (vomit).  Your burning or discomfort with peeing does not go away.  You have a fever.  Your symptoms are not better in 3 days. MAKE SURE YOU:   Understand these instructions.  Will watch your condition.  Will get help right away if you are not doing well or get worse.   This information is not intended to replace advice given to you by your health care provider. Make sure you discuss any questions you have with  your health care provider.   Document Released: 09/22/2007 Document Revised: 04/26/2014 Document Reviewed: 11/04/2011 Elsevier Interactive Patient Education [employer]

## 2015-07-22 LAB — URINE CULTURE: Culture: >=100000

## 2015-07-23 ENCOUNTER — Telehealth (HOSPITAL_COMMUNITY): Payer: Self-pay

## 2015-07-23 NOTE — Telephone Encounter (Signed)
Post ED Visit - Positive Culture Follow-up  Culture report reviewed by antimicrobial stewardship pharmacist:  []  Enzo BiNathan Batchelder, Pharm.D. []  Celedonio MiyamotoJeremy Frens, Pharm.D., BCPS []  Garvin FilaMike Maccia, Pharm.D. []  Georgina PillionElizabeth Martin, Pharm.D., BCPS []  BerwindMinh Pham, [address] BoulevardPharm.D., BCPS, AAHIVP []  Estella HuskMichelle Turner, Pharm.D., BCPS, AAHIVP []  Tennis Mustassie Stewart, Pharm.D. []  Rob Oswaldo DoneVincent, [address]Pharm[address] Darin EngelsX  Meagan Miles, Pharm.D.  Positive urine culture, >/= 100,000 colonies -> E Coli Treated with Cephalexin, organism sensitive to the same and no further patient follow-up is required at this time.  Arvid RightClark, Uvaldo Rybacki Dorn 07/23/2015, 9:49 AM

## 2016-03-03 ENCOUNTER — Emergency Department (HOSPITAL_COMMUNITY)
Admission: EM | Admit: 2016-03-03 | Discharge: 2016-03-03 | Disposition: A | Discharge status: Home / Self Care | Payer: Self-pay | Attending: Emergency Medicine | Admitting: Emergency Medicine

## 2016-03-03 ENCOUNTER — Encounter (HOSPITAL_COMMUNITY): Payer: Self-pay

## 2016-03-03 DIAGNOSIS — F17210 Nicotine dependence, cigarettes, uncomplicated: Secondary | ICD-10-CM | POA: Insufficient documentation

## 2016-03-03 DIAGNOSIS — N390 Urinary tract infection, site not specified: Secondary | ICD-10-CM | POA: Insufficient documentation

## 2016-03-03 DIAGNOSIS — Z7982 Long term (current) use of aspirin: Secondary | ICD-10-CM | POA: Insufficient documentation

## 2016-03-03 LAB — URINALYSIS, ROUTINE W REFLEX MICROSCOPIC
Bilirubin Urine: NEGATIVE
GLUCOSE, UA: NEGATIVE mg/dL
KETONES UR: NEGATIVE mg/dL
Nitrite: POSITIVE — AB
PROTEIN: NEGATIVE mg/dL
Specific Gravity, Urine: 1.01 (ref 1.005–1.030)
pH: 6.5 (ref 5.0–8.0)

## 2016-03-03 LAB — URINE MICROSCOPIC-ADD ON

## 2016-03-03 LAB — PREGNANCY, URINE: Preg Test, Ur: NEGATIVE

## 2016-03-03 MED ORDER — CIPROFLOXACIN HCL 250 MG PO TABS
500.0000 mg | ORAL_TABLET | Freq: Once | ORAL | Status: AC
  Administered 2016-03-03: 500 mg via ORAL
  Filled 2016-03-03: qty 2

## 2016-03-03 MED ORDER — CIPROFLOXACIN HCL 500 MG PO TABS: 500.0000 mg | ORAL_TABLET | Freq: Two times a day (BID) | ORAL | 0 refills | Status: AC

## 2016-03-03 NOTE — ED Provider Notes (Signed)
[facility]PT Provider Note   CSN: [id number] Arrival date & time: 03/03/16  [id number]     History   Chief Complaint Chief Complaint  Patient presents with  . Back Pain    HPI [name] is a 30 y.o. female.  HPI  30 y.o. female , presents to the Emergency Department today complaining of right flank pain x 1 week. Hx same. States that she feels like she has a kidney infection as she has them twice a year. Notes pain 8/10 and intermittent. Notes worsening with urination. Has dysuria. No N/V. No fevers. Tylenol and goody powders with minimal relief. No other symptoms noted.    History reviewed. No pertinent past medical history.  There are no active problems to display for this patient.   Past Surgical History:  Procedure Laterality Date  . BLADDER SURGERY    . CESAREAN SECTION      OB History    Gravida Para Term Preterm AB Living   3 3 3     3    SAB TAB Ectopic Multiple Live Births                   Home Medications    Prior to Admission medications   Medication Sig Start Date End Date Taking? Authorizing Provider  Aspirin-Salicylamide-Caffeine (BC HEADACHE) 325-95-16 MG TABS Take 1-2 packets by mouth daily.    Historical Provider, MD  cephALEXin (KEFLEX) 500 MG capsule Take 1 capsule (500 mg total) by mouth 4 (four) times daily. For 7 days 07/19/15   Tammy Triplett, PA-C  guaiFENesin-codeine (ROBITUSSIN AC) 100-10 MG/5ML syrup Take 10 mLs by mouth 3 (three) times daily as needed. 07/19/15   Tammy Triplett, PA-C  ibuprofen (ADVIL,MOTRIN) 800 MG tablet Take 1 tablet (800 mg total) by mouth 3 (three) times daily. 07/19/15   Tammy Triplett, PA-C    Family History Family History  Problem Relation Age of Onset  . Cancer Other   . Diabetes Other     Social History Social History  Substance Use Topics  . Smoking status: Current Every Day Smoker    Packs/day: 0.50    Years: 2.00    Types: Cigarettes  . Smokeless tobacco: Never Used  . Alcohol use No      Allergies   Patient has no known allergies.   Review of Systems Review of Systems  Constitutional: Negative for fever.  Gastrointestinal: Negative for nausea and vomiting.  Genitourinary: Positive for dysuria and flank pain. Negative for hematuria, pelvic pain, vaginal bleeding, vaginal discharge and vaginal pain.  Musculoskeletal: Negative for back pain.   Physical Exam Updated Vital Signs BP 151/78   Pulse 63   Temp 98.8 F (37.1 C) (Oral)   Resp 18   Ht 5\' 6"  (1.676 m)   Wt 115.2 kg   LMP 01/02/2016 (Approximate)   SpO2 100%   BMI 41.00 kg/m   Physical Exam  Constitutional: She is oriented to person, place, and time. Vital signs are normal. She appears well-developed and well-nourished.  HENT:  Head: Normocephalic.  Right Ear: Hearing normal.  Left Ear: Hearing normal.  Eyes: Conjunctivae and EOM are normal. Pupils are equal, round, and reactive to light.  Neck: Normal range of motion. Neck supple.  Cardiovascular: Normal rate, regular rhythm, normal heart sounds and intact distal pulses.   Pulmonary/Chest: Effort normal and breath sounds normal.  Abdominal: Soft. There is CVA tenderness (right).  Neurological: She is alert and oriented to person, place, and time.  Skin: Skin is warm and dry.  Psychiatric: She has a normal mood and affect. Her speech is normal and behavior is normal. Thought content normal.  Nursing note and vitals reviewed.  ED Treatments / Results  Labs (all labs ordered are listed, but only abnormal results are displayed) Labs Reviewed  URINALYSIS, ROUTINE W REFLEX MICROSCOPIC (NOT AT [facility]) - Abnormal; Notable for the following:       Result Value   APPearance HAZY (*)    Hgb urine dipstick LARGE (*)    Nitrite POSITIVE (*)    Leukocytes, UA MODERATE (*)    All other components within normal limits  URINE MICROSCOPIC-ADD ON - Abnormal; Notable for the following:    Squamous Epithelial / LPF 0-5 (*)    Bacteria, UA MANY (*)    All  other components within normal limits  PREGNANCY, URINE   EKG  EKG Interpretation None      Radiology No results found.  Procedures Procedures (including critical care time)  Medications Ordered in ED Medications - No data to display   Initial Impression / Assessment and Plan / ED Course  I have reviewed the triage vital signs and the nursing notes.  Pertinent labs & imaging results that were available during my care of the patient were reviewed by me and considered in my medical decision making (see chart for details).  Clinical Course    Final Clinical Impressions(s) / ED Diagnoses  I have reviewed and evaluated the relevant laboratory values I have reviewed the relevant previous healthcare records.I obtained HPI from historian.  ED Course:  Assessment: Pt is a 30yF with dysuria and flank pain x 1 week. Hx same. No fevers. No N/V. Pt has been diagnosed with a UTI based on UA. On exam, pt in NAD. VSS. Normotensive. Afebrile. Lungs CTA, Heart RRR, mild CVA tenderness on right flank. Pt to be dc home with antibiotics and instructions to follow up with PCP if symptoms persist.  Disposition/Plan:  DC Home Additional Verbal discharge instructions given and discussed with patient.  Pt Instructed to f/u with PCP in the next week for evaluation and treatment of symptoms. Return precautions given Pt acknowledges and agrees with plan  Supervising Physician Samuel JesterKathleen McManus, DO   Final diagnoses:  Urinary tract infection without hematuria, site unspecified    New Prescriptions New Prescriptions   No medications on file     Audry Piliyler Shivaan Tierno, PA-C 03/03/16 0849    Samuel JesterKathleen McManus, DO 03/03/16 1621

## 2016-03-03 NOTE — Discharge Instructions (Signed)
Please read and follow all provided instructions.  Your diagnoses today include:  1. Urinary tract infection without hematuria, site unspecified     Tests performed today include: Urine test - suggests that you have an infection in your bladder Vital signs. See below for your results today.   Medications:  Take as prescribed   Home care instructions:  Follow any educational materials contained in this packet.  Follow-up instructions: Please follow-up with your primary care provider in 3 days if symptoms are not resolved for further evaluation of your symptoms.  Return instructions:  Please return to the Emergency Department if you experience worsening symptoms.  Return with fever, worsening pain, persistent vomiting, worsening pain in your back.  Please return if you have any other emergent concerns.  Additional Information:  Your vital signs today were: BP 151/78    Pulse 63    Temp 98.8 F (37.1 C) (Oral)    Resp 18    Ht 5\' 6"  (1.676 m)    Wt 115.2 kg    LMP 01/02/2016 (Approximate)    SpO2 100%    BMI 41.00 kg/m  If your blood pressure (BP) was elevated above 135/85 this visit, please have this repeated by your doctor within one month. --------------

## 2016-03-03 NOTE — ED Triage Notes (Signed)
Pt reports r lower back pain radiating into r hip x 1 week.  Pt says she thinks its her kidneys because the pain gets worse when she urinates.  Reports has this problem frequently.

## 2016-03-06 LAB — URINE CULTURE: Culture: >=100000 — AB

## 2016-03-07 ENCOUNTER — Telehealth (HOSPITAL_BASED_OUTPATIENT_CLINIC_OR_DEPARTMENT_OTHER): Payer: Self-pay

## 2016-03-07 NOTE — Telephone Encounter (Signed)
Post ED Visit - Positive Culture Follow-up  Culture report reviewed by antimicrobial stewardship pharmacist:  []  Enzo BiNathan Batchelder, Pharm.D. []  Celedonio MiyamotoJeremy Frens, Pharm.D., BCPS []  Garvin FilaMike Maccia, Pharm.D. []  Georgina PillionElizabeth Martin, Pharm.D., BCPS []  IrwinMinh Pham, [address]Pharm.D., BCPS, AAHIVP []  Estella HuskMichelle Turner, Pharm.D., BCPS, AAHIVP []  Tennis Mustassie Stewart, Pharm.D. []  Sherle Poeob Vincent, [place]arm[place]D. Allsion Masters Pharm D Positive urine culture Treated with Ciprofloxacin HCL, organism sensitive to the same and no further patient follow-up is required at this time.  Kivon Aprea, Linnell FullingRose Burnett 03/07/2016, 10:00 AM

## 2016-07-29 ENCOUNTER — Emergency Department (HOSPITAL_COMMUNITY)
Admission: EM | Admit: 2016-07-29 | Discharge: 2016-07-29 | Disposition: A | Discharge status: Home / Self Care | Payer: Self-pay | Attending: Emergency Medicine | Admitting: Emergency Medicine

## 2016-07-29 ENCOUNTER — Encounter (HOSPITAL_COMMUNITY): Payer: Self-pay

## 2016-07-29 DIAGNOSIS — Z7982 Long term (current) use of aspirin: Secondary | ICD-10-CM | POA: Insufficient documentation

## 2016-07-29 DIAGNOSIS — F17210 Nicotine dependence, cigarettes, uncomplicated: Secondary | ICD-10-CM | POA: Insufficient documentation

## 2016-07-29 DIAGNOSIS — N3090 Cystitis, unspecified without hematuria: Secondary | ICD-10-CM | POA: Insufficient documentation

## 2016-07-29 LAB — COMPREHENSIVE METABOLIC PANEL
ALBUMIN: 3.7 g/dL (ref 3.5–5.0)
ALT: 29 U/L (ref 14–54)
AST: 24 U/L (ref 15–41)
Alkaline Phosphatase: 57 U/L (ref 38–126)
Anion gap: 5 (ref 5–15)
BUN: 17 mg/dL (ref 6–20)
CHLORIDE: 109 mmol/L (ref 101–111)
CO2: 25 mmol/L (ref 22–32)
CREATININE: 0.58 mg/dL (ref 0.44–1.00)
Calcium: 9.5 mg/dL (ref 8.9–10.3)
GFR calc Af Amer: >60 mL/min (ref >60)
GLUCOSE: 104 mg/dL — AB (ref 65–99)
POTASSIUM: 4.3 mmol/L (ref 3.5–5.1)
Sodium: 139 mmol/L (ref 135–145)
Total Bilirubin: 0.4 mg/dL (ref 0.3–1.2)
Total Protein: 7.3 g/dL (ref 6.5–8.1)

## 2016-07-29 LAB — CBC WITH DIFFERENTIAL/PLATELET
BASOS ABS: 0.1 10*3/uL (ref 0.0–0.1)
BASOS PCT: 1 %
Eosinophils Absolute: 0.3 10*3/uL (ref 0.0–0.7)
Eosinophils Relative: 3 %
HEMATOCRIT: 40.4 % (ref 36.0–46.0)
Hemoglobin: 13.3 g/dL (ref 12.0–15.0)
LYMPHS PCT: 24 %
Lymphs Abs: 2.3 10*3/uL (ref 0.7–4.0)
MCH: 26.9 pg (ref 26.0–34.0)
MCHC: 32.9 g/dL (ref 30.0–36.0)
MCV: 81.6 fL (ref 78.0–100.0)
Monocytes Absolute: 0.7 10*3/uL (ref 0.1–1.0)
Monocytes Relative: 8 %
NEUTROS ABS: 6.2 10*3/uL (ref 1.7–7.7)
Neutrophils Relative %: 64 %
PLATELETS: 291 10*3/uL (ref 150–400)
RBC: 4.95 MIL/uL (ref 3.87–5.11)
RDW: 14.7 % (ref 11.5–15.5)
WBC: 9.5 10*3/uL (ref 4.0–10.5)

## 2016-07-29 LAB — URINALYSIS, ROUTINE W REFLEX MICROSCOPIC
Bilirubin Urine: NEGATIVE
Glucose, UA: NEGATIVE mg/dL
KETONES UR: NEGATIVE mg/dL
Nitrite: POSITIVE — AB
PROTEIN: 100 mg/dL — AB
Specific Gravity, Urine: 1.02 (ref 1.005–1.030)
pH: 6 (ref 5.0–8.0)

## 2016-07-29 MED ORDER — ONDANSETRON HCL 4 MG/2ML IJ SOLN
4.0000 mg | Freq: Once | INTRAMUSCULAR | Status: AC
  Administered 2016-07-29: 4 mg via INTRAVENOUS
  Filled 2016-07-29: qty 2

## 2016-07-29 MED ORDER — SODIUM CHLORIDE 0.9 % IV BOLUS (SEPSIS)
500.0000 mL | Freq: Once | INTRAVENOUS | Status: AC
  Administered 2016-07-29: 500 mL via INTRAVENOUS

## 2016-07-29 MED ORDER — CEPHALEXIN 500 MG PO CAPS: 500.0000 mg | ORAL_CAPSULE | Freq: Four times a day (QID) | ORAL | 0 refills | Status: DC

## 2016-07-29 MED ORDER — DEXTROSE 5 % IV SOLN
1.0000 g | Freq: Once | INTRAVENOUS | Status: AC
  Administered 2016-07-29: 1 g via INTRAVENOUS
  Filled 2016-07-29: qty 10

## 2016-07-29 NOTE — Discharge Instructions (Signed)
Drink plenty of fluids.  Follow up with the [facility] when antibiotic finished

## 2016-07-29 NOTE — ED Notes (Signed)
Patient is requesting to see doctor at this time. She says she is "getting agitated waiting."

## 2016-07-29 NOTE — ED Provider Notes (Signed)
[facility] Provider Note   CSN: [id number] Arrival date & time: 07/29/16  4098  By signing my name below, I, [name], attest that this documentation has been prepared under the direction and in the presence of Diana Berkshire, MD . Electronically Signed: Majel Morales, Scribe. 07/29/2016. 8:22 AM.  History   Chief Complaint Chief Complaint  Patient presents with  . Dysuria   The history is provided by the patient. No language interpreter was used.  Dysuria   This is a recurrent problem. The current episode started more than 1 week ago. The problem has been gradually worsening. The pain is at a severity of 6/10. The pain is mild. There has been no fever. Associated symptoms include chills and nausea. Pertinent negatives include no vomiting, no frequency and no hematuria. She has tried nothing for the symptoms. Her past medical history is significant for recurrent UTIs.   HPI Comments: [name] is a 31 y.o. female with PMHx of recurrent UTIs, who presents to the Emergency Department complaining of gradually worsening, 6/10, dysuria that began ~2 weeks ago. Pt reports associated lower abdominal pain, nausea, and chills ~3 days ago that has now resolved. She notes PSHx of bladder reconstruction surgery in 1988 and 3 caesarian sections. She states that since her last caesarian section 3 years ago, she has experienced multiple bladder infections throughout the year with symptoms including "foggy" appearance of her urine and foul odor. Pt denies any vomiting.   History reviewed. No pertinent past medical history.  There are no active problems to display for this patient.   Past Surgical History:  Procedure Laterality Date  . BLADDER SURGERY    . CESAREAN SECTION      OB History    Gravida Para Term Preterm AB Living   SAB TAB Ectopic Multiple Live Births                 Home Medications    Prior to Admission medications   Medication Sig Start Date End Date  Taking? Authorizing Provider  Aspirin-Salicylamide-Caffeine (BC HEADACHE) 325-95-16 MG TABS Take 1-2 packets by mouth daily.    Historical Provider, MD  cephALEXin (KEFLEX) 500 MG capsule Take 1 capsule (500 mg total) by mouth 4 (four) times daily. For 7 days 07/19/15   Tammy Triplett, PA-C  guaiFENesin-codeine (ROBITUSSIN AC) 100-10 MG/5ML syrup Take 10 mLs by mouth 3 (three) times daily as needed. 07/19/15   Tammy Triplett, PA-C  ibuprofen (ADVIL,MOTRIN) 800 MG tablet Take 1 tablet (800 mg total) by mouth 3 (three) times daily. 07/19/15   Tammy Triplett, PA-C    Family History Family History  Problem Relation Age of Onset  . Cancer Other   . Diabetes Other     Social History Social History  Substance Use Topics  . Smoking status: Current Every Day Smoker    Packs/day: 0.50    Years: 2.00    Types: Cigarettes  . Smokeless tobacco: Never Used  . Alcohol use No     Allergies   Patient has no known allergies.   Review of Systems Review of Systems  Constitutional: Positive for chills. Negative for appetite change and fatigue.  HENT: Negative for congestion, ear discharge and sinus pressure.   Eyes: Negative for discharge.  Respiratory: Negative for cough.   Cardiovascular: Negative for chest pain.  Gastrointestinal: Positive for abdominal pain and nausea. Negative for diarrhea and vomiting.  Genitourinary: Positive for dysuria.  Negative for frequency and hematuria.  Musculoskeletal: Negative for back pain.  Skin: Negative for rash.  Neurological: Negative for seizures and headaches.  Psychiatric/Behavioral: Negative for hallucinations.   Physical Exam Updated Vital Signs BP (!) 146/71 (BP Location: Right Arm)   Pulse 76   Temp 97.9 F (36.6 C) (Oral)   Resp 16   Ht  (1.676 m)   Wt 250 lb (113.4 kg)   LMP 05/31/2016 Comment: Unknown, irregular  SpO2 100%   BMI 40.35 kg/m   Physical Exam  Constitutional: She is oriented to person, place, and time. She appears  well-developed.  HENT:  Head: Normocephalic.  Eyes: Conjunctivae and EOM are normal. No scleral icterus.  Neck: Neck supple. No thyromegaly present.  Cardiovascular: Normal rate and regular rhythm.  Exam reveals no gallop and no friction rub.   No murmur heard. Pulmonary/Chest: No stridor. She has no wheezes. She has no rales. She exhibits no tenderness.  Abdominal: She exhibits no distension. There is tenderness. There is no rebound.  Mild suprapubic tenderness.   Musculoskeletal: Normal range of motion. She exhibits no edema.  Lymphadenopathy:    She has no cervical adenopathy.  Neurological: She is oriented to person, place, and time. She exhibits normal muscle tone. Coordination normal.  Skin: No rash noted. No erythema.  Psychiatric: She has a normal mood and affect. Her behavior is normal.   ED Treatments / Results  DIAGNOSTIC STUDIES:  Oxygen Saturation is 100% on RA, normal by my interpretation.    COORDINATION OF CARE:  8:18 AM Discussed treatment plan with pt at bedside and pt agreed to plan.  Labs (all labs ordered are listed, but only abnormal results are displayed) Labs Reviewed  URINALYSIS, ROUTINE W REFLEX MICROSCOPIC - Abnormal; Notable for the following:       Result Value   Color, Urine Lielle (*)    APPearance CLOUDY (*)    Hgb urine dipstick LARGE (*)    Protein, ur 100 (*)    Nitrite POSITIVE (*)    Leukocytes, UA MODERATE (*)    Bacteria, UA RARE (*)    Squamous Epithelial / LPF 0-5 (*)    Crystals PRESENT (*)    All other components within normal limits  POC URINE PREG, ED    EKG  EKG Interpretation None       Radiology No results found.  Procedures Procedures (including critical care time)  Medications Ordered in ED Medications - No data to display  Initial Impression / Assessment and Plan / ED Course  I have reviewed the triage vital signs and the nursing notes.  Pertinent labs & imaging results that were available during my care  of the patient were reviewed by me and considered in my medical decision making (see chart for details).     Patient with urinary tract infection. She'll be put on Keflex. Referred to the [facility] for recheck  Final Clinical Impressions(s) / ED Diagnoses   Final diagnoses:  None    New Prescriptions New Prescriptions   No medications on file  The chart was scribed for me under my direct supervision.  I personally performed the history, physical, and medical decision making and all procedures in the evaluation of this patient.Diana Berkshire, MD 07/29/16 1135

## 2016-07-29 NOTE — ED Triage Notes (Signed)
Pt reports burning with urination for 2-3 weeks. Pt has seen blood in urine and urine is foul smelling

## 2016-08-07 ENCOUNTER — Emergency Department (HOSPITAL_COMMUNITY)
Admission: EM | Admit: 2016-08-07 | Discharge: 2016-08-07 | Disposition: A | Discharge status: Home / Self Care | Payer: Self-pay | Attending: Emergency Medicine | Admitting: Emergency Medicine

## 2016-08-07 ENCOUNTER — Encounter (HOSPITAL_COMMUNITY): Payer: Self-pay

## 2016-08-07 DIAGNOSIS — Z7982 Long term (current) use of aspirin: Secondary | ICD-10-CM | POA: Insufficient documentation

## 2016-08-07 DIAGNOSIS — W228XXA Striking against or struck by other objects, initial encounter: Secondary | ICD-10-CM | POA: Insufficient documentation

## 2016-08-07 DIAGNOSIS — Y929 Unspecified place or not applicable: Secondary | ICD-10-CM | POA: Insufficient documentation

## 2016-08-07 DIAGNOSIS — F17210 Nicotine dependence, cigarettes, uncomplicated: Secondary | ICD-10-CM | POA: Insufficient documentation

## 2016-08-07 DIAGNOSIS — Y998 Other external cause status: Secondary | ICD-10-CM | POA: Insufficient documentation

## 2016-08-07 DIAGNOSIS — S0502XA Injury of conjunctiva and corneal abrasion without foreign body, left eye, initial encounter: Secondary | ICD-10-CM | POA: Insufficient documentation

## 2016-08-07 DIAGNOSIS — Y9389 Activity, other specified: Secondary | ICD-10-CM | POA: Insufficient documentation

## 2016-08-07 MED ORDER — FLUORESCEIN SODIUM 0.6 MG OP STRP
ORAL_STRIP | OPHTHALMIC | Status: AC
  Filled 2016-08-07: qty 1

## 2016-08-07 MED ORDER — TETRACAINE HCL 0.5 % OP SOLN
OPHTHALMIC | Status: AC
  Filled 2016-08-07: qty 4

## 2016-08-07 MED ORDER — ERYTHROMYCIN 5 MG/GM OP OINT
TOPICAL_OINTMENT | Freq: Four times a day (QID) | OPHTHALMIC | Status: DC
  Administered 2016-08-07: 1 via OPHTHALMIC
  Filled 2016-08-07: qty 3.5

## 2016-08-07 MED ORDER — TETRACAINE HCL 0.5 % OP SOLN: 2.0000 [drp] | Freq: Once | OPHTHALMIC | Status: DC

## 2016-08-07 NOTE — ED Triage Notes (Signed)
Pt was hit in the left eye with her child's lego plastic toy. Pt states she awoke overnight with pain and watering to the eye and is unable to open the eye

## 2016-08-07 NOTE — ED Provider Notes (Signed)
[facility] Provider Note   CSN: [id number] Arrival date & time: 08/07/16  0206     History   Chief Complaint Chief Complaint  Patient presents with  . Eye Injury    HPI Kaleyah L Lavoy is a 31 y.o. female.  The history is provided by the patient.  Eye Injury  This is a new problem. The current episode started 3 to 5 hours ago. The problem occurs constantly. The problem has been gradually worsening. Exacerbated by: light. Nothing relieves the symptoms.  pt report she was playing with Lego toys with her child,when one of them flew into her left eye Since then she has had worsening and tearing from left eye No other complaints No h/o eye surgery She does not wear contact lenses   PMH  none Past Surgical History:  Procedure Laterality Date  . BLADDER SURGERY    . CESAREAN SECTION      OB History    Gravida Para Term Preterm AB Living   SAB TAB Ectopic Multiple Live Births                   Home Medications    Prior to Admission medications   Medication Sig Start Date End Date Taking? Authorizing Provider  Aspirin-Salicylamide-Caffeine (BC HEADACHE) 325-95-16 MG TABS Take 1-2 packets by mouth daily.   Yes Historical Provider, MD  cephALEXin (KEFLEX) 500 MG capsule Take 1 capsule (500 mg total) by mouth 4 (four) times daily. 07/29/16  Yes Bethann Berkshire, MD  ibuprofen (ADVIL,MOTRIN) 800 MG tablet Take 1 tablet (800 mg total) by mouth 3 (three) times daily. 07/19/15  Yes Tammy Triplett, PA-C  guaiFENesin-codeine (ROBITUSSIN AC) 100-10 MG/5ML syrup Take 10 mLs by mouth 3 (three) times daily as needed. 07/19/15   Tammy Triplett, PA-C    Family History Family History  Problem Relation Age of Onset  . Cancer Other   . Diabetes Other     Social History Social History  Substance Use Topics  . Smoking status: Current Every Day Smoker    Packs/day: 0.50    Years: 2.00    Types: Cigarettes  . Smokeless tobacco: Never Used  . Alcohol use No      Allergies   Patient has no known allergies.   Review of Systems Review of Systems  Constitutional: Negative for fever.  Eyes: Positive for pain, redness and visual disturbance.     Physical Exam Updated Vital Signs BP 137/89 (BP Location: Left Arm)   Pulse 68   Temp 99 F (37.2 C) (Oral)   Resp 17   Ht  (1.676 m)   Wt 114.3 kg   LMP 07/06/2016 (Approximate)   SpO2 97%   BMI 40.67 kg/m   Physical Exam CONSTITUTIONAL: Well developed/well nourished HEAD: Normocephalic/atraumatic EYES: EOMI/PERRL, OS - conjunctival injection, +small central corneal abrasion in OS, no foreign bodies, no corneal hazing, no consensual pain, visual acuity limited due to fact she would not open her eyelids.  No proptosis Negative Seidel sign ENMT: Mucous membranes moist NECK: supple no meningeal signs NEURO: Pt is awake/alert/appropriate, moves all extremitiesx4.  No facial droop. She is ambulatory  SKIN: warm, color normal PSYCH: no abnormalities of mood noted, alert and oriented to situation   ED Treatments / Results  Labs (all labs ordered are listed, but only abnormal results are displayed) Labs Reviewed - No data to display  EKG  EKG Interpretation None  Radiology No results found.  Procedures Procedures (including critical care time)  Medications Ordered in ED Medications  tetracaine (PONTOCAINE) 0.5 % ophthalmic solution 2 drop (not administered)  fluorescein 0.6 MG ophthalmic strip (not administered)  erythromycin ophthalmic ointment (1 application Left Eye Given 08/07/16 0241)     Initial Impression / Assessment and Plan / ED Course  I have reviewed the triage vital signs and the nursing notes. Pt with small corneal abrasion She has used erythromycin ointment before Will start here in the ED She was referred to ophthalmology   Final Clinical Impressions(s) / ED Diagnoses   Final diagnoses:  Abrasion of left cornea, initial encounter    New  Prescriptions Discharge Medication List as of 08/07/2016  2:36 AM       Zadie Rhine, MD 08/07/16 0300

## 2016-08-25 ENCOUNTER — Emergency Department (HOSPITAL_COMMUNITY)
Admission: EM | Admit: 2016-08-25 | Discharge: 2016-08-25 | Disposition: A | Discharge status: Home / Self Care | Payer: Self-pay | Attending: Emergency Medicine | Admitting: Emergency Medicine

## 2016-08-25 ENCOUNTER — Encounter (HOSPITAL_COMMUNITY): Payer: Self-pay | Admitting: *Deleted

## 2016-08-25 DIAGNOSIS — M545 Low back pain, unspecified: Secondary | ICD-10-CM

## 2016-08-25 DIAGNOSIS — F17210 Nicotine dependence, cigarettes, uncomplicated: Secondary | ICD-10-CM | POA: Insufficient documentation

## 2016-08-25 DIAGNOSIS — Z791 Long term (current) use of non-steroidal anti-inflammatories (NSAID): Secondary | ICD-10-CM | POA: Insufficient documentation

## 2016-08-25 DIAGNOSIS — Z79899 Other long term (current) drug therapy: Secondary | ICD-10-CM | POA: Insufficient documentation

## 2016-08-25 LAB — POC URINE PREG, ED: Preg Test, Ur: NEGATIVE

## 2016-08-25 LAB — URINALYSIS, ROUTINE W REFLEX MICROSCOPIC
BILIRUBIN URINE: NEGATIVE
Glucose, UA: NEGATIVE mg/dL
Hgb urine dipstick: NEGATIVE
KETONES UR: NEGATIVE mg/dL
LEUKOCYTES UA: NEGATIVE
NITRITE: NEGATIVE
PROTEIN: NEGATIVE mg/dL
Specific Gravity, Urine: 1.02 (ref 1.005–1.030)
pH: 6 (ref 5.0–8.0)

## 2016-08-25 MED ORDER — METHOCARBAMOL 500 MG PO TABS: 500.0000 mg | ORAL_TABLET | Freq: Two times a day (BID) | ORAL | 0 refills | Status: DC

## 2016-08-25 MED ORDER — DICLOFENAC SODIUM 50 MG PO TBEC: 50.0000 mg | DELAYED_RELEASE_TABLET | Freq: Two times a day (BID) | ORAL | 0 refills | Status: DC

## 2016-08-25 NOTE — ED Provider Notes (Signed)
[facility] Provider Note   CSN: [id number] Arrival date & time: 08/25/16  0811     History   Chief Complaint Chief Complaint  Patient presents with  . Back Pain    HPI [name] is a 31 y.o. female.  The history is provided by the patient. No language interpreter was used.  Back Pain   This is a new problem. The current episode started more than 2 days ago. The problem occurs constantly. The problem has been gradually worsening. The pain is associated with no known injury. The pain is present in the lumbar spine. The pain does not radiate. The pain is moderate. The symptoms are aggravated by bending. The pain is the same all the time. Pertinent negatives include no chest pain and no abdominal pain.   Pt reports she has had back pain since having an epidural.  Pt reports pain flarea up some times.  Pt complains of pain in low back History reviewed. No pertinent past medical history.  There are no active problems to display for this patient.   Past Surgical History:  Procedure Laterality Date  . BLADDER SURGERY    . CESAREAN SECTION      OB History    Gravida Para Term Preterm AB Living   3 3 3     3    SAB TAB Ectopic Multiple Live Births                   Home Medications    Prior to Admission medications   Medication Sig Start Date End Date Taking? Authorizing Provider  Aspirin-Salicylamide-Caffeine (BC HEADACHE) 325-95-16 MG TABS Take 1-2 packets by mouth daily.    [provider]  cephALEXin (KEFLEX) 500 MG capsule Take 1 capsule (500 mg total) by mouth 4 (four) times daily. 07/29/16   Bethann Berkshire, MD  guaiFENesin-codeine (ROBITUSSIN AC) 100-10 MG/5ML syrup Take 10 mLs by mouth 3 (three) times daily as needed. 07/19/15   Triplett, Tammy, PA-C  ibuprofen (ADVIL,MOTRIN) 800 MG tablet Take 1 tablet (800 mg total) by mouth 3 (three) times daily. 07/19/15   Pauline Aus, PA-C    Family History Family History  Problem Relation Age of Onset  .  Cancer Other   . Diabetes Other     Social History Social History  Substance Use Topics  . Smoking status: Current Every Day Smoker    Packs/day: 0.50    Years: 2.00    Types: Cigarettes  . Smokeless tobacco: Never Used  . Alcohol use No     Allergies   Patient has no known allergies.   Review of Systems Review of Systems  Cardiovascular: Negative for chest pain.  Gastrointestinal: Negative for abdominal pain.  Musculoskeletal: Positive for back pain.  All other systems reviewed and are negative.    Physical Exam Updated Vital Signs BP 115/70   Pulse 65   Temp 98.2 F (36.8 C) (Oral)   Resp 18   Ht 5\' 6"  (1.676 m)   Wt 114.3 kg   LMP  (LMP Unknown)   SpO2 100%   BMI 40.67 kg/m   Physical Exam  Constitutional: She appears well-developed and well-nourished. No distress.  HENT:  Head: Normocephalic and atraumatic.  Eyes: Conjunctivae are normal.  Neck: Neck supple.  Cardiovascular: Normal rate and regular rhythm.   No murmur heard. Pulmonary/Chest: Effort normal and breath sounds normal. No respiratory distress.  Abdominal: Soft. There is no tenderness.  Musculoskeletal: She exhibits no edema.  Tender  lower back, no spinal tenderness  Neurological: She is alert.  Skin: Skin is warm and dry.  Psychiatric: She has a normal mood and affect.  Nursing note and vitals reviewed.    ED Treatments / Results  Labs (all labs ordered are listed, but only abnormal results are displayed) Labs Reviewed  URINALYSIS, ROUTINE W REFLEX MICROSCOPIC - Abnormal; Notable for the following:       Result Value   APPearance HAZY (*)    All other components within normal limits  POC URINE PREG, ED    EKG  EKG Interpretation None       Radiology No results found.  Procedures Procedures (including critical care time)  Medications Ordered in ED Medications - No data to display   Initial Impression / Assessment and Plan / ED Course  I have reviewed the triage  vital signs and the nursing notes.  Pertinent labs & imaging results that were available during my care of the patient were reviewed by me and considered in my medical decision making (see chart for details).       Final Clinical Impressions(s) / ED Diagnoses   Final diagnoses:  Acute low back pain without sciatica, unspecified back pain laterality    New Prescriptions New Prescriptions   DICLOFENAC (VOLTAREN) 50 MG EC TABLET    Take 1 tablet (50 mg total) by mouth 2 (two) times daily.   METHOCARBAMOL (ROBAXIN) 500 MG TABLET    Take 1 tablet (500 mg total) by mouth 2 (two) times daily.  An After Visit Summary was printed and given to the patient.    Elson AreasSofia, Nattie Lazenby K, PA-C 08/25/16 1022    Loren RacerYelverton, David, MD 08/30/16 [phone]

## 2016-08-25 NOTE — Discharge Instructions (Signed)
Return if any problems.

## 2016-08-25 NOTE — ED Triage Notes (Signed)
Low back pain, worse with movement, may be pregnant

## 2016-10-04 ENCOUNTER — Other Ambulatory Visit (HOSPITAL_COMMUNITY)
Admission: RE | Admit: 2016-10-04 | Discharge: 2016-10-04 | Disposition: A | Discharge status: Home / Self Care | Payer: Self-pay | Source: Ambulatory Visit | Attending: Obstetrics and Gynecology | Admitting: Obstetrics and Gynecology

## 2016-10-04 ENCOUNTER — Ambulatory Visit (INDEPENDENT_AMBULATORY_CARE_PROVIDER_SITE_OTHER): Payer: Self-pay | Admitting: Obstetrics and Gynecology

## 2016-10-04 ENCOUNTER — Encounter: Payer: Self-pay | Admitting: Obstetrics and Gynecology

## 2016-10-04 VITALS — BP 124/80 | HR 64 | Ht 66.0 in | Wt 286.8 lb

## 2016-10-04 DIAGNOSIS — Z124 Encounter for screening for malignant neoplasm of cervix: Secondary | ICD-10-CM | POA: Insufficient documentation

## 2016-10-04 DIAGNOSIS — N870 Mild cervical dysplasia: Secondary | ICD-10-CM | POA: Insufficient documentation

## 2016-10-04 NOTE — Patient Instructions (Signed)
,  jfweight Weight loss guidelines and tips   1. Utilize measuring cups and spoons (found at a low price at [facility]) to monitor and manage serving sizes. Use them to carefully measure out serving sizes listed on food packaging to prevent overeating and better manage caloric intake. Most of us tell ourselves lies about how much a serving really is. For example, a serving of meat is actually 4 oz, which is the size of a deck of cards!!!   2. Utilize smart phone apps like "My Net Diary", "My Fitness Pal", "Lose It", or "Eat Better" to keep track of calorie intake and exercise. Also consider purchasing a pedometer to keep track of daily steps. Some smart phones have built in pedometers to keep track of step counts (your goal is 10,000 steps per day).   3. Make sure you are consuming enough water daily. Drink 8 oz prior to meals, or at snack time, to cut down on overeating.   4. Consider exercise programs. [employer] offers water aerobics classes that are low-impact to minimize joint pains while still burning calories. For maximum impact at home, do small exercises while watching TV during commercial breaks.   The Truth Table:   3,500 calories = 1 pound of fat  Minus 500 calories a day x 7 days will lose you 1 pound  Exercise helps, but one mile walk = 200 calories burned   10,000 steps is your goal for the day, that's 4-5 miles!   *Please cut out and put on your refrigerator*

## 2016-10-04 NOTE — Progress Notes (Signed)
[facility] Visit  10/04/2016        Patient name: [name] MRN [id number]  Date of birth: 1985/09/27  CC & HPI:  [name] is a 31 y.o. female presenting today to discuss the possibility of having a panniculectomy. She states her abdomen is weighing her down and the extra weight coupled with long periods of standing is causing increased knee pain. Pt does not have current plans to have children but has not completely ruled it out for the future. She has irregular periods which is normal for her. States she has ~4-5 periods a year, bleeds 5-7 days, and bleeding has been manageable. Last pap was ~ 7 years ago.   ROS:  ROS Otherwise negative for acute change except as noted in the HPI.  Pertinent History Reviewed:   Reviewed: Significant for c-section x 3, bladder surgery Medical         Past Medical History:  Diagnosis Date  . Kidney infection                               Surgical Hx:    Past Surgical History:  Procedure Laterality Date  . BLADDER SURGERY    . CESAREAN SECTION     Medications: Reviewed & Updated - see associated section                       Current Outpatient Prescriptions:  .  Aspirin-Salicylamide-Caffeine (BC HEADACHE) 325-95-16 MG TABS, Take 1-2 packets by mouth daily., Disp: , Rfl:  .  ibuprofen (ADVIL,MOTRIN) 800 MG tablet, Take 1 tablet (800 mg total) by mouth 3 (three) times daily., Disp: 21 tablet, Rfl: 0   Social History: Reviewed -  reports that she has been smoking Cigarettes.  She has a 0.50 pack-year smoking history. She has never used smokeless tobacco.  Objective Findings:  Vitals: Blood pressure 124/80, pulse 64, height 5\' 6"  (1.676 m), weight 286 lb 12.8 oz (130.1 kg), last menstrual period 09/05/2016.  Physical Examination: General appearance - alert, well appearing, and in no distress Mental status - alert, oriented to person, place, and time Abdomen- bilobed pannus, minimal in midline but very wide and huge to posterior  hip bone Pelvic -  VULVA: normal appearing vulva with no masses, tenderness or lesions ; midline scar from bladder surgery on mons pubis VAGINA: normal appearing vagina with normal color and discharge, no lesions,  CERVIX: normal appearing cervix without discharge or lesions,  UTERUS: uterus is normal size, shape, consistency and nontender, well supported  ADNEXA: normal adnexa in size, nontender and no masses,  PAP: Pap smear done today  Discussion: Discussed with pt weight loss methods including food measurement and calorie counting using apps such as MyNetDiary or My Fitness Pal. Recommended the use of measuring cups and spoons to monitor serving sizes. Encouraged adequate daily water intake, especially prior to meals to eliminate overeating. Additionally encouraged pt to become more active by taking daily walks of at least 30 minute duration, join a local gym such as the [facility] or attend water aerobics classes. Also advised pt to use pedometer on smartphone or utilize a smartband such as FitBit to keep track of daily activity.   At end of discussion, pt had opportunity to ask questions and has no further questions at this time.   Specific discussion of lifestyle changes, behavioral modifications and healthy eating habits as noted  above. Greater than 50% was spent in counseling and coordination of care with the patient.  Total time greater than: 40 minutes.    Assessment & Plan:   A:  1. Morbid obesity  2. 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 Lower abdominal scarring s/p multiple surgeries   P:  1. Discussed weight loss strategies and options 2. Follow up in 1 month for further discussion  3. Pap done  3 By signing my name below, I, Freida Busmaniana Morales, attest that this documentation has been prepared under the direction and in the presence of Tilda BurrowJohn V Daytona Retana, MD . Electronically Signed: Freida Busmaniana Morales, Scribe. 10/04/2016. 12:28 PM. I personally performed the services described in  this documentation, which was SCRIBED in my presence. The recorded information has been reviewed and considered accurate. It has been edited as necessary during review. Tilda BurrowFERGUSON,Kieon Lawhorn V, MD

## 2016-10-07 LAB — CYTOLOGY - PAP
CHLAMYDIA, DNA PROBE: NEGATIVE
HPV: DETECTED — AB
NEISSERIA GONORRHEA: NEGATIVE

## 2016-10-11 ENCOUNTER — Telehealth: Payer: Self-pay | Admitting: *Deleted

## 2016-10-11 NOTE — Telephone Encounter (Signed)
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 Advised pt she would need to schedule a colposcopy. Pt verbalized understanding.

## 2016-10-22 ENCOUNTER — Ambulatory Visit (INDEPENDENT_AMBULATORY_CARE_PROVIDER_SITE_OTHER): Payer: Self-pay | Admitting: Obstetrics and Gynecology

## 2016-10-22 ENCOUNTER — Encounter: Payer: Self-pay | Admitting: Obstetrics and Gynecology

## 2016-10-22 ENCOUNTER — Other Ambulatory Visit: Payer: Self-pay | Admitting: Obstetrics and Gynecology

## 2016-10-22 VITALS — BP 128/82 | HR 64 | Ht 66.0 in | Wt 281.2 lb

## 2016-10-22 DIAGNOSIS — R309 Painful micturition, unspecified: Secondary | ICD-10-CM

## 2016-10-22 DIAGNOSIS — R350 Frequency of micturition: Secondary | ICD-10-CM

## 2016-10-22 DIAGNOSIS — R87612 Low grade squamous intraepithelial lesion on cytologic smear of cervix (LGSIL): Secondary | ICD-10-CM

## 2016-10-22 DIAGNOSIS — Z3202 Encounter for pregnancy test, result negative: Secondary | ICD-10-CM

## 2016-10-22 LAB — POCT URINALYSIS DIPSTICK
GLUCOSE UA: NEGATIVE
KETONES UA: NEGATIVE
Nitrite, UA: NEGATIVE
Protein, UA: NEGATIVE

## 2016-10-22 LAB — POCT URINE PREGNANCY: Preg Test, Ur: NEGATIVE

## 2016-10-22 NOTE — Addendum Note (Signed)
Addended by: Moss McRESENZO, Dwana Garin M on: 10/22/2016 01:02 PM   Modules accepted: Orders

## 2016-10-22 NOTE — Progress Notes (Signed)
  [name] 30 y.o. [phone] here for colposcopy for low-grade squamous intraepithelial neoplasia (LGSIL - encompassing HPV,mild dysplasia,CIN I) pap smear on 10/09/16. Pt denies having a colposcopy completed in the past.   Discussed role for HPV in cervical dysplasia, need for surveillance.  Patient given informed consent, signed copy in the chart, time out was performed.  Placed in lithotomy position. Cervix viewed with speculum and colposcope after application of acetic acid.   Colposcopy adequate? Yes  no visible lesions; no biopsies obtained.   ECC specimen obtained. Yes All specimens were labelled and sent to pathology.   Colposcopy IMPRESSION:  1. No visible lesions noted to cervix.   P:  1. Follow up in 1 year for pap smear and co-testing (HPV)   Patient was given post procedure instructions. Will follow up pathology and manage accordingly.  Routine preventative health maintenance measures emphasized.    By signing my name below, I, Soijett Blue, attest that this documentation has been prepared under the direction and in the presence of Tilda BurrowFerguson, Ericka Marcellus V, MD. Electronically Signed: Soijett Blue, ED Scribe. 10/22/16. 11:48 AM.     I personally performed the services described in this documentation, which was SCRIBED in my presence. The recorded information has been reviewed and considered accurate. It has been edited as necessary during review. Tilda BurrowFERGUSON,Mahdiya Mossberg V, MD

## 2016-10-25 LAB — URINE CULTURE

## 2016-11-01 ENCOUNTER — Ambulatory Visit (INDEPENDENT_AMBULATORY_CARE_PROVIDER_SITE_OTHER): Payer: Self-pay | Admitting: Obstetrics and Gynecology

## 2016-11-01 ENCOUNTER — Encounter: Payer: Self-pay | Admitting: Obstetrics and Gynecology

## 2016-11-01 DIAGNOSIS — L905 Scar conditions and fibrosis of skin: Secondary | ICD-10-CM

## 2016-11-01 NOTE — Patient Instructions (Signed)

## 2016-11-01 NOTE — Progress Notes (Signed)
   [facility] Visit  11/01/2016  Patient name: [name] MRN [id number]  Date of birth: 1986-01-28  CC & HPI:  [name] is a 31 y.o. female presenting today for follow up of weight loss discussion. Pt reports no acute  complaints or symptoms today. Pt was seen on 10/04/16 to discuss weight loss strategies and options. She states she has cut back on carbohydrates and sodas, and has lost 8 lbs since 10/04/16.    ROS:  ROS -fever -bleeding  Pertinent History Reviewed:   Reviewed : Significant for c-section x3, bladder surgery Medical         Past Medical History:  Diagnosis Date  . Kidney infection                               Surgical Hx:    Past Surgical History:  Procedure Laterality Date  . BLADDER SURGERY    . CESAREAN SECTION    . COLPOSCOPY     Medications: Reviewed & Updated - see associated section                       Current Outpatient Prescriptions:  .  Aspirin-Salicylamide-Caffeine (BC HEADACHE) 325-95-16 MG TABS, Take 1-2 packets by mouth daily., Disp: , Rfl:    Social History: Reviewed -  reports that she has been smoking Cigarettes.  She has a 0.50 pack-year smoking history. She has never used smokeless tobacco.  Objective Findings:  Vitals: Blood pressure 130/90, pulse 68, height 5\' 6"  (1.676 m), weight 278 lb 9.6 oz (126.4 kg).  Physical Examination: General appearance - alert, well appearing, and in no distress Mental status - alert, oriented to person, place, and time  Discussion: 1. Discussed with pt benefits of exercise, calorie counting, and a buddy-system to assist with weight loss.   At end of discussion, pt had opportunity to ask questions and has no further questions at this time.   Specific discussion of further weight loss options as noted above. Greater than 50% was spent in counseling and coordination of care with the patient.   Total time greater than: 25 minutes.    Assessment & Plan:   A:  1. Obesity Body mass  index is 44.97 kg/m.  2. Moderate progress with weight loss  P:  1. 1800 calorie diet recommended, Pt to begin measuring calories and water intake at 64 ounces per day, with exercise 3-5 times a week 2. F/u in 1 month.    By signing my name below, I, Izna Ahmed, attest that this documentation has been prepared under the direction and in the presence of Tilda BurrowFerguson, Herald Vallin V, MD. Electronically Signed: Redge GainerIzna Ahmed, ED Scribe. 11/01/16. 9:09 AM.  I personally performed the services described in this documentation, which was SCRIBED in my presence. The recorded information has been reviewed and considered accurate. It has been edited as necessary during review. Tilda BurrowFERGUSON,Joevanni Roddey V, MD

## 2016-11-14 ENCOUNTER — Emergency Department (HOSPITAL_COMMUNITY)
Admission: EM | Admit: 2016-11-14 | Discharge: 2016-11-14 | Disposition: A | Discharge status: Home / Self Care | Payer: Self-pay | Attending: Emergency Medicine | Admitting: Emergency Medicine

## 2016-11-14 ENCOUNTER — Encounter (HOSPITAL_COMMUNITY): Payer: Self-pay | Admitting: Emergency Medicine

## 2016-11-14 DIAGNOSIS — F17210 Nicotine dependence, cigarettes, uncomplicated: Secondary | ICD-10-CM | POA: Insufficient documentation

## 2016-11-14 DIAGNOSIS — N390 Urinary tract infection, site not specified: Secondary | ICD-10-CM | POA: Insufficient documentation

## 2016-11-14 DIAGNOSIS — Z7982 Long term (current) use of aspirin: Secondary | ICD-10-CM | POA: Insufficient documentation

## 2016-11-14 LAB — URINALYSIS, ROUTINE W REFLEX MICROSCOPIC
BILIRUBIN URINE: NEGATIVE
GLUCOSE, UA: NEGATIVE mg/dL
KETONES UR: NEGATIVE mg/dL
NITRITE: POSITIVE — AB
PH: 8 (ref 5.0–8.0)
Protein, ur: 100 mg/dL — AB
Specific Gravity, Urine: 1.017 (ref 1.005–1.030)

## 2016-11-14 MED ORDER — CIPROFLOXACIN HCL 500 MG PO TABS: 500.0000 mg | ORAL_TABLET | Freq: Two times a day (BID) | ORAL | 0 refills | Status: AC

## 2016-11-14 MED ORDER — CIPROFLOXACIN HCL 250 MG PO TABS
500.0000 mg | ORAL_TABLET | Freq: Once | ORAL | Status: AC
  Administered 2016-11-14: 500 mg via ORAL
  Filled 2016-11-14: qty 2

## 2016-11-14 NOTE — ED Triage Notes (Addendum)
Pt reports urinary frequency,dysuria, x1 week. Pt reports pain recently started radiating to left side of abdomen. Pt reports taking AZO OTC with no relief. Pt reports history of same. Pt denies n/v/d.

## 2016-11-14 NOTE — Discharge Instructions (Signed)
Please read attached information regarding condition. Take cipro twice daily for one week. Follow up with PCP if symptoms persist. Return to ED for worsening abdominal pain, fevers, blood in urine, vomiting.

## 2016-11-14 NOTE — ED Provider Notes (Signed)
[facility]Y [facility]PT Provider Note   CSN: [id number] Arrival date & time: 11/14/16  0807     History   Chief Complaint Chief Complaint  Patient presents with  . Urinary Frequency    HPI [name] is a 31 y.o. female.  HPI  Patient presents to ED for evaluation of 1 week history of urinary frequency and dysuria Consistent with her previous UTIs. She also reports left-sided abdominal pain and flank pain that began 2 days ago. She has tried taking Azo over-the-counter with mild relief in symptoms. She has taken Keflex approximately 3-4 times a year for UTIs. She reports frequent UTIs since her last C-section several years ago. She denies any nausea, vomiting, diarrhea, constipation, vaginal discharge, fever, chills or appetite changes.   Past Medical History:  Diagnosis Date  . Kidney infection     There are no active problems to display for this patient.   Past Surgical History:  Procedure Laterality Date  . BLADDER SURGERY    . CESAREAN SECTION    . COLPOSCOPY      OB History    Gravida Para Term Preterm AB Living   4 3 3   1 3    SAB TAB Ectopic Multiple Live Births           3       Home Medications    Prior to Admission medications   Medication Sig Start Date End Date Taking? Authorizing Provider  Aspirin-Salicylamide-Caffeine (BC HEADACHE) 325-95-16 MG TABS Take 1-2 packets by mouth daily.    [provider]  ciprofloxacin (CIPRO) 500 MG tablet Take 1 tablet (500 mg total) by mouth every 12 (twelve) hours. 11/14/16 11/21/16  Dietrich PatesKhatri, Tyanne Derocher, PA-C    Family History Family History  Problem Relation Age of Onset  . Cancer Other   . Diabetes Other   . Cancer Maternal Grandmother   . Asthma Maternal Grandmother   . Arthritis Maternal Grandmother   . Cancer Maternal Grandfather     Social History Social History  Substance Use Topics  . Smoking status: Current Every Day Smoker    Packs/day: 0.25    Years: 2.00    Types: Cigarettes  . Smokeless  tobacco: Never Used  . Alcohol use No     Allergies   Patient has no known allergies.   Review of Systems Review of Systems  Constitutional: Negative for chills and fever.  Gastrointestinal: Positive for abdominal pain. Negative for constipation, diarrhea, nausea and vomiting.  Genitourinary: Positive for dysuria, flank pain and frequency. Negative for decreased urine volume, hematuria, vaginal bleeding, vaginal discharge and vaginal pain.  Musculoskeletal: Negative for myalgias.  Skin: Negative for rash.     Physical Exam Updated Vital Signs BP 137/90 (BP Location: Left Arm)   Pulse 80   Temp 97.8 F (36.6 C) (Oral)   Resp 18   Ht 5\' 6"  (1.676 m)   Wt 126.1 kg (278 lb)   LMP 09/28/2016   SpO2 99%   BMI 44.87 kg/m   Physical Exam  Constitutional: She appears well-developed and well-nourished. No distress.  Nontoxic appearing and not in acute distress. Resting comfortably on bed.  HENT:  Head: Normocephalic and atraumatic.  Eyes: Conjunctivae and EOM are normal. No scleral icterus.  Neck: Normal range of motion.  Cardiovascular: Normal rate, regular rhythm and normal heart sounds.   Pulmonary/Chest: Effort normal and breath sounds normal. No respiratory distress.  Abdominal: Soft. Bowel sounds are normal. She exhibits no distension. There is no  tenderness. There is no guarding.  Left-sided CVA tenderness.  Neurological: She is alert.  Skin: No rash noted. She is not diaphoretic.  Psychiatric: She has a normal mood and affect.  Nursing note and vitals reviewed.    ED Treatments / Results  Labs (all labs ordered are listed, but only abnormal results are displayed) Labs Reviewed  URINALYSIS, ROUTINE W REFLEX MICROSCOPIC - Abnormal; Notable for the following:       Result Value   Color, Urine [name] (*)    APPearance CLOUDY (*)    Hgb urine dipstick MODERATE (*)    Protein, ur 100 (*)    Nitrite POSITIVE (*)    Leukocytes, UA MODERATE (*)    Bacteria, UA FEW  (*)    Squamous Epithelial / LPF 0-5 (*)    All other components within normal limits    EKG  EKG Interpretation None       Radiology No results found.  Procedures Procedures (including critical care time)  Medications Ordered in ED Medications  ciprofloxacin (CIPRO) tablet 500 mg (not administered)     Initial Impression / Assessment and Plan / ED Course  I have reviewed the triage vital signs and the nursing notes.  Pertinent labs & imaging results that were available during my care of the patient were reviewed by me and considered in my medical decision making (see chart for details).     Patient presents to ED for evaluation of dysuria and urinary frequency for the past week. History of similar symptoms with multiple visits for UTIs in the past. History of bladder reconstruction surgery in several C-sections which she states caused her she had 3-4 UTIs per year. She is afebrile with no history of fever. Left-sided CVA tenderness noted. Urinalysis showed nitrite, leukocytes and bacteria. We'll treat with Cipro for UTI considering patient has been put on Keflex several times in the recent past. First dose given here in the ED per patient request. Patient appears stable for discharge at this time. Strict return precautions given.  Final Clinical Impressions(s) / ED Diagnoses   Final diagnoses:  Lower urinary tract infectious disease    New Prescriptions New Prescriptions   CIPROFLOXACIN (CIPRO) 500 MG TABLET    Take 1 tablet (500 mg total) by mouth every 12 (twelve) hours.     Dietrich PatesKhatri, Thaddeaus Monica, PA-C 11/14/16 [id number]    Jacalyn LefevreHaviland, Julie, MD 11/14/16 1355

## 2016-11-29 ENCOUNTER — Ambulatory Visit (INDEPENDENT_AMBULATORY_CARE_PROVIDER_SITE_OTHER): Payer: Self-pay | Admitting: Obstetrics and Gynecology

## 2016-11-29 DIAGNOSIS — Z91199 Patient's noncompliance with other medical treatment and regimen due to unspecified reason: Secondary | ICD-10-CM

## 2016-11-29 DIAGNOSIS — Z5329 Procedure and treatment not carried out because of patient's decision for other reasons: Secondary | ICD-10-CM

## 2016-12-17 NOTE — Progress Notes (Signed)
No show for appt. 

## 2017-07-05 ENCOUNTER — Encounter (HOSPITAL_COMMUNITY): Payer: Self-pay | Admitting: Emergency Medicine

## 2017-07-05 ENCOUNTER — Other Ambulatory Visit: Payer: Self-pay

## 2017-07-05 ENCOUNTER — Emergency Department (HOSPITAL_COMMUNITY)
Admission: EM | Admit: 2017-07-05 | Discharge: 2017-07-05 | Disposition: A | Discharge status: Home / Self Care | Payer: Self-pay | Attending: Emergency Medicine | Admitting: Emergency Medicine

## 2017-07-05 DIAGNOSIS — Z7982 Long term (current) use of aspirin: Secondary | ICD-10-CM | POA: Insufficient documentation

## 2017-07-05 DIAGNOSIS — N3000 Acute cystitis without hematuria: Secondary | ICD-10-CM | POA: Insufficient documentation

## 2017-07-05 DIAGNOSIS — J069 Acute upper respiratory infection, unspecified: Secondary | ICD-10-CM | POA: Insufficient documentation

## 2017-07-05 DIAGNOSIS — F17210 Nicotine dependence, cigarettes, uncomplicated: Secondary | ICD-10-CM | POA: Insufficient documentation

## 2017-07-05 LAB — PREGNANCY, URINE: Preg Test, Ur: NEGATIVE

## 2017-07-05 LAB — URINALYSIS, ROUTINE W REFLEX MICROSCOPIC
Bilirubin Urine: NEGATIVE
Glucose, UA: NEGATIVE mg/dL
Ketones, ur: NEGATIVE mg/dL
Nitrite: POSITIVE — AB
PH: 7 (ref 5.0–8.0)
Protein, ur: NEGATIVE mg/dL
SPECIFIC GRAVITY, URINE: 1.009 (ref 1.005–1.030)

## 2017-07-05 LAB — RAPID STREP SCREEN (MED CTR MEBANE ONLY): Streptococcus, Group A Screen (Direct): NEGATIVE

## 2017-07-05 MED ORDER — CEPHALEXIN 500 MG PO CAPS
500.0000 mg | ORAL_CAPSULE | Freq: Once | ORAL | Status: AC
  Administered 2017-07-05: 500 mg via ORAL
  Filled 2017-07-05: qty 1

## 2017-07-05 MED ORDER — DM-GUAIFENESIN ER 30-600 MG PO TB12: 1.0000 | ORAL_TABLET | Freq: Two times a day (BID) | ORAL | 1 refills | Status: DC

## 2017-07-05 MED ORDER — CEPHALEXIN 500 MG PO CAPS: 500.0000 mg | ORAL_CAPSULE | Freq: Four times a day (QID) | ORAL | 0 refills | Status: DC

## 2017-07-05 NOTE — ED Provider Notes (Signed)
[facility] EMERGENCY DEPARTMENT Provider Note   CSN: [id number] Arrival date & time: 07/05/17  [id number]     History   Chief Complaint Chief Complaint  Patient presents with  . Urinary Frequency    HPI [name] is a 32 y.o. female.  Patient does not have primary care provider.  Patient here with a complaint of a upper respiratory infection.  Patient feels like she had the flu at the beginning of March and now she is starting again with sore throat and congestion.  Family member has had strep.  Sore throat is been present for 2 days.  Some mild right flank pain dysuria and urinary frequency started recently as well.  Patient feels as if she has a urinary tract infection.  She gets them about twice a year.      Past Medical History:  Diagnosis Date  . Kidney infection     There are no active problems to display for this patient.   Past Surgical History:  Procedure Laterality Date  . BLADDER SURGERY    . CESAREAN SECTION    . COLPOSCOPY      OB History    Gravida Para Term Preterm AB Living   4 3 3   1 3    SAB TAB Ectopic Multiple Live Births           3       Home Medications    Prior to Admission medications   Medication Sig Start Date End Date Taking? Authorizing Provider  Aspirin-Salicylamide-Caffeine (BC HEADACHE) 325-95-16 MG TABS Take 1-2 packets by mouth daily.   Yes [provider]  cephALEXin (KEFLEX) 500 MG capsule Take 1 capsule (500 mg total) by mouth 4 (four) times daily. 07/05/17   Vanetta MuldersZackowski, Braedon Sjogren, MD  dextromethorphan-guaiFENesin [facility](MUCINEX DM) 30-600 MG 12hr tablet Take 1 tablet by mouth 2 (two) times daily. 07/05/17   Vanetta MuldersZackowski, Chevelle Durr, MD    Family History Family History  Problem Relation Age of Onset  . Cancer Other   . Diabetes Other   . Cancer Maternal Grandmother   . Asthma Maternal Grandmother   . Arthritis Maternal Grandmother   . Cancer Maternal Grandfather     Social History Social History   Tobacco Use  . Smoking  status: Current Every Day Smoker    Packs/day: 0.25    Years: 2.00    Pack years: 0.50    Types: Cigarettes  . Smokeless tobacco: Never Used  Substance Use Topics  . Alcohol use: No  . Drug use: No     Allergies   Patient has no known allergies.   Review of Systems Review of Systems  Constitutional: Negative for fever.  HENT: Positive for congestion and sore throat.   Eyes: Negative for redness.  Respiratory: Positive for cough.   Gastrointestinal: Negative for abdominal pain.  Genitourinary: Positive for dysuria and flank pain.  Musculoskeletal: Negative for myalgias.  Skin: Negative for rash.  Neurological: Negative for headaches.  Hematological: Does not bruise/bleed easily.  Psychiatric/Behavioral: Negative for confusion.     Physical Exam Updated Vital Signs Ht 1.676 m (5\' 6" )   Wt 127 kg (280 lb)   BMI 45.19 kg/m   Physical Exam  Constitutional: She is oriented to person, place, and time. She appears well-developed and well-nourished. No distress.  HENT:  Head: Normocephalic and atraumatic.  Mouth/Throat: Oropharynx is clear and moist. No oropharyngeal exudate.  Slight erythema to the posterior pharynx uvula midline.  Eyes: Conjunctivae and EOM are  normal. Pupils are equal, round, and reactive to light.  Neck: Normal range of motion. Neck supple.  Cardiovascular: Normal rate, regular rhythm and normal heart sounds.  Pulmonary/Chest: Effort normal and breath sounds normal. No respiratory distress. She has no wheezes.  Abdominal: Soft. Bowel sounds are normal. There is no tenderness.  Musculoskeletal: Normal range of motion. She exhibits no tenderness.  Neurological: She is alert and oriented to person, place, and time. No cranial nerve deficit. She exhibits normal muscle tone. Coordination normal.  Skin: Skin is warm.  Nursing note and vitals reviewed.    ED Treatments / Results  Labs (all labs ordered are listed, but only abnormal results are  displayed) Labs Reviewed  URINALYSIS, ROUTINE W REFLEX MICROSCOPIC - Abnormal; Notable for the following components:      Result Value   APPearance HAZY (*)    Hgb urine dipstick SMALL (*)    Nitrite POSITIVE (*)    Leukocytes, UA MODERATE (*)    Bacteria, UA RARE (*)    Squamous Epithelial / LPF 6-30 (*)    All other components within normal limits  RAPID STREP SCREEN (NOT AT [facility])  URINE CULTURE  CULTURE, GROUP A STREP [facility])  PREGNANCY, URINE    EKG  EKG Interpretation None       Radiology No results found.  Procedures Procedures (including critical care time)  Medications Ordered in ED Medications  cephALEXin (KEFLEX) capsule 500 mg (500 mg Oral Given 07/05/17 0834)     Initial Impression / Assessment and Plan / ED Course  I have reviewed the triage vital signs and the nursing notes.  Pertinent labs & imaging results that were available during my care of the patient were reviewed by me and considered in my medical decision making (see chart for details).    Urine suggest a very early urinary tract infection.  Positive nitrite.  Culture sent given first dose of Keflex here will be continued on Keflex for the next 7 days.  Rapid strep was negative for strep pharyngitis.  Other symptoms seem to be consistent with an uncomplicated upper respiratory infection.  Will treat with Mucinex.  Patient given referral to local public health and free clinic for primary care follow-up.   Final Clinical Impressions(s) / ED Diagnoses   Final diagnoses:  Acute cystitis without hematuria  Upper respiratory tract infection, unspecified type    ED Discharge Orders        Ordered    cephALEXin (KEFLEX) 500 MG capsule  4 times daily     07/05/17 0916    dextromethorphan-guaiFENesin (MUCINEX DM) 30-600 MG 12hr tablet  2 times daily     07/05/17 1610       Vanetta Mulders, MD 07/05/17 [phone]

## 2017-07-05 NOTE — Discharge Instructions (Signed)
Urine consistent with urinary tract infection.  Culture has been sent.  Take the Keflex as directed for the next 7 days.  Also Mucinex DM for the cough and congestion.  Strep test was negative.  Information provided about [facility] [place] [facility] they will also help you with other resources.

## 2017-07-05 NOTE — ED Notes (Signed)
Pt states comes to Ed at least twice a year for urine infections. Pt does not have a PCP. [facility] as she does not have [facility]. Pt wants referral or information.

## 2017-07-05 NOTE — ED Triage Notes (Signed)
Pt c/o of urinary frequency, burning with urination, and right lower back pain.  Also c/o of sore throat and chills all x 2 weeks.

## 2017-07-07 LAB — CULTURE, GROUP A STREP (THRC)

## 2017-07-07 LAB — URINE CULTURE: Culture: >=100000 — AB

## 2017-07-08 ENCOUNTER — Telehealth: Payer: Self-pay | Admitting: *Deleted

## 2017-07-08 NOTE — Telephone Encounter (Signed)
Post ED Visit - Positive Culture Follow-up  Culture report reviewed by antimicrobial stewardship pharmacist:  [] Nathan Batchelder, Pharm.D. [] Jeremy Frens, Pharm.D., BCPS AQ-ID [] Mike Maccia, Pharm.D., BCPS [x] Elizabeth Martin, Pharm.D., BCPS [] Minh Pham, Pharm.D., BCPS, AAHIVP [] Michelle Turner, Pharm.D., BCPS, AAHIVP [] Rachel Rumbarger, PharmD, BCPS [] Shannon Parkey, PharmD [] Andy Johnston, PharmD, BCPS  Positive urine culture Treated with Cephalexin, organism sensitive to the same and no further patient follow-up is required at this time.  [name] 07/08/2017, 10:49 AM   

## 2017-09-05 ENCOUNTER — Encounter (HOSPITAL_COMMUNITY): Payer: Self-pay

## 2017-09-05 ENCOUNTER — Other Ambulatory Visit: Payer: Self-pay

## 2017-09-05 ENCOUNTER — Emergency Department (HOSPITAL_COMMUNITY)
Admission: EM | Admit: 2017-09-05 | Discharge: 2017-09-05 | Disposition: A | Discharge status: Home / Self Care | Payer: Self-pay | Attending: Emergency Medicine | Admitting: Emergency Medicine

## 2017-09-05 DIAGNOSIS — N3000 Acute cystitis without hematuria: Secondary | ICD-10-CM

## 2017-09-05 DIAGNOSIS — K0889 Other specified disorders of teeth and supporting structures: Secondary | ICD-10-CM

## 2017-09-05 DIAGNOSIS — F17210 Nicotine dependence, cigarettes, uncomplicated: Secondary | ICD-10-CM | POA: Insufficient documentation

## 2017-09-05 DIAGNOSIS — J029 Acute pharyngitis, unspecified: Secondary | ICD-10-CM

## 2017-09-05 DIAGNOSIS — Z79899 Other long term (current) drug therapy: Secondary | ICD-10-CM | POA: Insufficient documentation

## 2017-09-05 DIAGNOSIS — N3090 Cystitis, unspecified without hematuria: Secondary | ICD-10-CM | POA: Insufficient documentation

## 2017-09-05 LAB — URINALYSIS, ROUTINE W REFLEX MICROSCOPIC
BILIRUBIN URINE: NEGATIVE
Glucose, UA: NEGATIVE mg/dL
KETONES UR: NEGATIVE mg/dL
NITRITE: POSITIVE — AB
Protein, ur: NEGATIVE mg/dL
Specific Gravity, Urine: 1.018 (ref 1.005–1.030)
WBC, UA: >50 WBC/hpf — ABNORMAL HIGH (ref 0–5)
pH: 7 (ref 5.0–8.0)

## 2017-09-05 LAB — POC URINE PREG, ED: PREG TEST UR: NEGATIVE

## 2017-09-05 MED ORDER — SULFAMETHOXAZOLE-TRIMETHOPRIM 800-160 MG PO TABS
1.0000 | ORAL_TABLET | Freq: Once | ORAL | Status: AC
  Administered 2017-09-05: 1 via ORAL
  Filled 2017-09-05: qty 1

## 2017-09-05 MED ORDER — SULFAMETHOXAZOLE-TRIMETHOPRIM 800-160 MG PO TABS: 1.0000 | ORAL_TABLET | Freq: Two times a day (BID) | ORAL | 0 refills | Status: AC

## 2017-09-05 MED ORDER — SULFAMETHOXAZOLE-TRIMETHOPRIM 400-80 MG PO TABS
1.0000 | ORAL_TABLET | Freq: Once | ORAL | Status: DC
  Filled 2017-09-05: qty 1

## 2017-09-05 NOTE — ED Provider Notes (Signed)
[facility] EMERGENCY DEPARTMENT Provider Note   CSN: [id number] Arrival date & time: 09/05/17  1478     History   Chief Complaint Chief Complaint  Patient presents with  . Sore Throat  . Urinary Frequency  . Dental Pain    HPI [name] is a 32 y.o. female.  Chief complaint is multiple complaints  HPI [name] complains of burning with urination and "I got another bladder infection".  Also has a sore throat since last night.  Has a chronically painful left upper tooth.  Past Medical History:  Diagnosis Date  . Kidney infection     There are no active problems to display for this patient.   Past Surgical History:  Procedure Laterality Date  . BLADDER SURGERY    . CESAREAN SECTION    . COLPOSCOPY       OB History    Gravida  4   Para  3   Term  3   Preterm      AB  1   Living  3     SAB      TAB      Ectopic      Multiple      Live Births  3            Home Medications    Prior to Admission medications   Medication Sig Start Date End Date Taking? Authorizing Provider  Aspirin-Salicylamide-Caffeine (BC HEADACHE) 325-95-16 MG TABS Take 1-2 packets by mouth daily.    [provider]  cephALEXin (KEFLEX) 500 MG capsule Take 1 capsule (500 mg total) by mouth 4 (four) times daily. 07/05/17   Vanetta Mulders, MD  dextromethorphan-guaiFENesin [facility] DM) 30-600 MG 12hr tablet Take 1 tablet by mouth 2 (two) times daily. 07/05/17   Vanetta Mulders, MD  sulfamethoxazole-trimethoprim (BACTRIM DS,SEPTRA DS) 800-160 MG tablet Take 1 tablet by mouth 2 (two) times daily for 3 days. 09/05/17 09/08/17  Rolland Porter, MD    Family History Family History  Problem Relation Age of Onset  . Cancer Other   . Diabetes Other   . Cancer Maternal Grandmother   . Asthma Maternal Grandmother   . Arthritis Maternal Grandmother   . Cancer Maternal Grandfather     Social History Social History   Tobacco Use  . Smoking status: Current Every Day Smoker      Packs/day: 0.25    Years: 2.00    Pack years: 0.50    Types: Cigarettes  . Smokeless tobacco: Never Used  Substance Use Topics  . Alcohol use: No  . Drug use: No     Allergies   Patient has no known allergies.   Review of Systems Review of Systems  Constitutional: Negative for appetite change, chills, diaphoresis, fatigue and fever.  HENT: Positive for dental problem and sore throat. Negative for mouth sores and trouble swallowing.   Eyes: Negative for visual disturbance.  Respiratory: Negative for cough, chest tightness, shortness of breath and wheezing.   Cardiovascular: Negative for chest pain.  Gastrointestinal: Negative for abdominal distention, abdominal pain, diarrhea, nausea and vomiting.  Endocrine: Negative for polydipsia, polyphagia and polyuria.  Genitourinary: Positive for frequency and urgency. Negative for dysuria and hematuria.  Musculoskeletal: Negative for gait problem.  Skin: Negative for color change, pallor and rash.  Neurological: Negative for dizziness, syncope, light-headedness and headaches.  Hematological: Does not bruise/bleed easily.  Psychiatric/Behavioral: Negative for behavioral problems and confusion.     Physical Exam Updated Vital Signs BP 140/79 (  BP Location: Left Arm)   Pulse 79   Temp 98.5 F (36.9 C) (Oral)   Resp 20   LMP 07/29/2017   SpO2 98%   Physical Exam  Constitutional: She is oriented to person, place, and time. She appears well-developed and well-nourished. No distress.  HENT:  Head: Normocephalic.  Dental fracture to the gumline on tooth #16.  No periodontal swelling.  Pharynx appears normal.  No adenopathy in the neck  Eyes: Pupils are equal, round, and reactive to light. Conjunctivae are normal. No scleral icterus.  Neck: Normal range of motion. Neck supple. No thyromegaly present.  Cardiovascular: Normal rate and regular rhythm. Exam reveals no gallop and no friction rub.  No murmur heard. Pulmonary/Chest:  Effort normal and breath sounds normal. No respiratory distress. She has no wheezes. She has no rales.  Abdominal: Soft. Bowel sounds are normal. She exhibits no distension. There is no tenderness. There is no rebound.  Musculoskeletal: Normal range of motion.  Neurological: She is alert and oriented to person, place, and time.  Skin: Skin is warm and dry. No rash noted.  Psychiatric: She has a normal mood and affect. Her behavior is normal.     ED Treatments / Results  Labs (all labs ordered are listed, but only abnormal results are displayed) Labs Reviewed  URINALYSIS, ROUTINE W REFLEX MICROSCOPIC - Abnormal; Notable for the following components:      Result Value   APPearance CLOUDY (*)    Hgb urine dipstick LARGE (*)    Nitrite POSITIVE (*)    Leukocytes, UA MODERATE (*)    WBC, UA >50 (*)    Bacteria, UA FEW (*)    All other components within normal limits  URINE CULTURE  POC URINE PREG, ED    EKG None  Radiology No results found.  Procedures Procedures (including critical care time)  Medications Ordered in ED Medications  sulfamethoxazole-trimethoprim (BACTRIM DS,SEPTRA DS) 800-160 MG per tablet 1 tablet (has no administration in time range)     Initial Impression / Assessment and Plan / ED Course  I have reviewed the triage vital signs and the nursing notes.  Pertinent labs & imaging results that were available during my care of the patient were reviewed by me and considered in my medical decision making (see chart for details).    Will be UTI per UA.  Not pregnant.  Plan see a dentist.  Likely viral pharyngitis-expectant management and Motrin.  Bactrim DS 3 times daily x3 days for UTI.  Final Clinical Impressions(s) / ED Diagnoses   Final diagnoses:  Pharyngitis, unspecified etiology  Pain, dental  Acute cystitis without hematuria    ED Discharge Orders        Ordered    sulfamethoxazole-trimethoprim (BACTRIM DS,SEPTRA DS) 800-160 MG tablet  2 times  daily     09/05/17 0942       Rolland Porter, MD 09/05/17 [phone]

## 2017-09-05 NOTE — ED Triage Notes (Signed)
Pt reports "dull" pain in back, urinary frequency, "stabbing" pelvic pain with urination.  Pt also c/o sore throat and toothache.

## 2017-09-05 NOTE — Discharge Instructions (Addendum)
Your tooth will continue to be painful until seen by dentist or oral surgeon. Bactrim for your urinary tract infection. If your culture shows resistance to Bactrim, you will be contacted and given further instructions

## 2017-09-07 LAB — URINE CULTURE: Culture: >=100000 — AB

## 2017-09-08 ENCOUNTER — Telehealth: Payer: Self-pay | Admitting: *Deleted

## 2017-09-08 NOTE — Telephone Encounter (Signed)
Post ED Visit - Positive Culture Follow-up  Culture report reviewed by antimicrobial stewardship pharmacist:   Enzo Bi, Pharm.D.  Celedonio Miyamoto, Pharm.D., BCPS AQ-ID  Garvin Fila, Pharm.D., BCPS  Georgina Pillion, [address].D., BCPS  [place], [address].D., BCPS, AAHIVP  Estella Husk, Pharm.D., BCPS, AAHIVP  Lysle Pearl, PharmD, BCPS  Sherlynn Carbon, PharmD  Pollyann Samples, PharmD, BCPS Sharin Mons, PharmD  Positive urine culture Treated with Sulfamethoxazole-Trimethoprim, organism sensitive to the same and no further patient follow-up is required at this time.  Virl [facility] 09/08/2017, 2:18 PM

## 2019-03-16 ENCOUNTER — Other Ambulatory Visit: Payer: Self-pay

## 2019-03-16 ENCOUNTER — Emergency Department (HOSPITAL_COMMUNITY)
Admission: EM | Admit: 2019-03-16 | Discharge: 2019-03-16 | Disposition: A | Discharge status: Home / Self Care | Payer: Self-pay | Attending: Emergency Medicine | Admitting: Emergency Medicine

## 2019-03-16 ENCOUNTER — Encounter (HOSPITAL_COMMUNITY): Payer: Self-pay

## 2019-03-16 DIAGNOSIS — F17210 Nicotine dependence, cigarettes, uncomplicated: Secondary | ICD-10-CM | POA: Insufficient documentation

## 2019-03-16 DIAGNOSIS — Z79899 Other long term (current) drug therapy: Secondary | ICD-10-CM | POA: Insufficient documentation

## 2019-03-16 DIAGNOSIS — N3000 Acute cystitis without hematuria: Secondary | ICD-10-CM

## 2019-03-16 DIAGNOSIS — Z3202 Encounter for pregnancy test, result negative: Secondary | ICD-10-CM | POA: Insufficient documentation

## 2019-03-16 HISTORY — DX: Urinary tract infection, site not specified: N39.0

## 2019-03-16 LAB — URINALYSIS, ROUTINE W REFLEX MICROSCOPIC
Bilirubin Urine: NEGATIVE
Glucose, UA: NEGATIVE mg/dL
Hgb urine dipstick: NEGATIVE
Ketones, ur: NEGATIVE mg/dL
Nitrite: POSITIVE — AB
Protein, ur: NEGATIVE mg/dL
Specific Gravity, Urine: 1.014 (ref 1.005–1.030)
WBC, UA: >50 WBC/hpf — ABNORMAL HIGH (ref 0–5)
pH: 6 (ref 5.0–8.0)

## 2019-03-16 LAB — PREGNANCY, URINE: Preg Test, Ur: NEGATIVE

## 2019-03-16 MED ORDER — CEPHALEXIN 500 MG PO CAPS: 500.0000 mg | ORAL_CAPSULE | Freq: Two times a day (BID) | ORAL | 0 refills | Status: DC

## 2019-03-16 NOTE — ED Triage Notes (Signed)
Pain with urination for 4 days. Noted to have frequency as well

## 2019-03-16 NOTE — Discharge Instructions (Addendum)
You were seen in the emergency department today for a UTI. We are starting you on Keflex, an antibiotic, please take this as prescribed.  Please take all of your antibiotics until finished. You may develop abdominal discomfort or diarrhea from the antibiotic.  You may help offset this with probiotics which you can buy at the store (ask your pharmacist if unable to find) or get probiotics in the form of eating yogurt. Do not eat or take the probiotics until 2 hours after your antibiotic. If you are unable to tolerate these side effects follow-up with your primary care provider or return to the emergency department.   If you begin to experience any blistering, rashes, swelling, or difficulty breathing seek medical care for evaluation of potentially more serious side effects.   Please be aware that this medication may interact with other medications you are taking, please be sure to discuss your medication list with your pharmacist. If you are taking birth control the antibiotic will deactivate your birth control for 2 weeks.   Please take Pyridium otherwise known as AZO per over-the-counter dosing for the next 24 to 48 hours as needed for discomfort with urination.  Please follow-up with your primary care provider within 1 week for reevaluation.  Return to the ER for new or worsening symptoms including but not limited to fever, chills, back pain, abdominal pain, vomiting, or any other concerns.

## 2019-03-16 NOTE — ED Provider Notes (Signed)
[facility] EMERGENCY DEPARTMENT Provider Note   CSN: [id number] Arrival date & time: 03/16/19  1311     History   Chief Complaint Chief Complaint  Patient presents with  . Urinary Tract Infection    HPI [name] is a 33 y.o. female with a history of prior UTI and tobacco abuse who presents to the emergency department with complaints of urinary symptoms over the past 4 days.  Patient states that she is having dysuria, frequency, and urgency.  No alleviating or aggravating factors.  States symptoms feel similar to prior UTIs, she has not had one in several months.  Denies fever, chills, nausea, vomiting, flank pain, abdominal pain, vaginal bleeding, or vaginal discharge.  She denies concern for STD.     HPI  Past Medical History:  Diagnosis Date  . Kidney infection   . UTI (urinary tract infection)     There are no active problems to display for this patient.   Past Surgical History:  Procedure Laterality Date  . BLADDER SURGERY    . CESAREAN SECTION    . COLPOSCOPY       OB History    Gravida  4   Para  3   Term  3   Preterm      AB  1   Living  3     SAB      TAB      Ectopic      Multiple      Live Births  3            Home Medications    Prior to Admission medications   Medication Sig Start Date End Date Taking? Authorizing Provider  Aspirin-Salicylamide-Caffeine (BC HEADACHE) 325-95-16 MG TABS Take 1-2 packets by mouth daily.    [provider]  cephALEXin (KEFLEX) 500 MG capsule Take 1 capsule (500 mg total) by mouth 4 (four) times daily. 07/05/17   Vanetta Mulders, MD  dextromethorphan-guaiFENesin [facility] DM) 30-600 MG 12hr tablet Take 1 tablet by mouth 2 (two) times daily. 07/05/17   Vanetta Mulders, MD    Family History Family History  Problem Relation Age of Onset  . Cancer Other   . Diabetes Other   . Cancer Maternal Grandmother   . Asthma Maternal Grandmother   . Arthritis Maternal Grandmother   . Cancer  Maternal Grandfather     Social History Social History   Tobacco Use  . Smoking status: Current Every Day Smoker    Packs/day: 0.25    Years: 2.00    Pack years: 0.50    Types: Cigarettes  . Smokeless tobacco: Never Used  Substance Use Topics  . Alcohol use: No  . Drug use: No     Allergies   Patient has no known allergies.   Review of Systems Review of Systems  Constitutional: Negative for chills and fever.  Respiratory: Negative for shortness of breath.   Cardiovascular: Negative for chest pain.  Gastrointestinal: Negative for abdominal pain, diarrhea, nausea and vomiting.  Genitourinary: Positive for dysuria, frequency and urgency. Negative for vaginal bleeding and vaginal discharge.     Physical Exam Updated Vital Signs BP 137/89 (BP Location: Right Arm)   Pulse 72   Temp 98.7 F (37.1 C) (Oral)   Resp 16   LMP 01/14/2019   SpO2 98%   Physical Exam Vitals signs and nursing note reviewed.  Constitutional:      General: She is not in acute distress.    Appearance: She  is well-developed. She is not toxic-appearing.  HENT:     Head: Normocephalic and atraumatic.  Eyes:     General:        Right eye: No discharge.        Left eye: No discharge.     Conjunctiva/sclera: Conjunctivae normal.  Neck:     Musculoskeletal: Neck supple.  Cardiovascular:     Rate and Rhythm: Normal rate and regular rhythm.  Pulmonary:     Effort: Pulmonary effort is normal. No respiratory distress.     Breath sounds: Normal breath sounds. No wheezing, rhonchi or rales.  Abdominal:     General: There is no distension.     Palpations: Abdomen is soft.     Tenderness: There is no abdominal tenderness. There is no right CVA tenderness, left CVA tenderness, guarding or rebound.  Skin:    General: Skin is warm and dry.     Findings: No rash.  Neurological:     Mental Status: She is alert.     Comments: Clear speech.   Psychiatric:        Behavior: Behavior normal.    ED  Treatments / Results  Labs (all labs ordered are listed, but only abnormal results are displayed) Labs Reviewed  URINALYSIS, ROUTINE W REFLEX MICROSCOPIC - Abnormal; Notable for the following components:      Result Value   Color, Urine Terena (*)    APPearance HAZY (*)    Nitrite POSITIVE (*)    Leukocytes,Ua TRACE (*)    WBC, UA >50 (*)    Bacteria, UA FEW (*)    All other components within normal limits  PREGNANCY, URINE    EKG None  Radiology No results found.  Procedures Procedures (including critical care time)  Medications Ordered in ED Medications - No data to display   Initial Impression / Assessment and Plan / ED Course  I have reviewed the triage vital signs and the nursing notes.  Pertinent labs & imaging results that were available during my care of the patient were reviewed by me and considered in my medical decision making (see chart for details).   Patient presents to the emergency department with urinary symptoms that are similar to her prior UTIs.  Patient is nontoxic-appearing, resting comfortably, vitals WNL.  Pregnancy test negative.  Urinalysis consistent with infection.  No H&P components to raise concern for pyelonephritis or sepsis. No abdominal tenderness/peritoneal signs.  She is not concerned for STDs.  She appears appropriate for course of outpatient antibiotics at this time.  Prior urine cultures have been reviewed.  Will start on Keflex.  Recommended Pyridium per over-the-counter dosing.with any discomfort over the next 24 to 48 hours. I discussed results, treatment plan, need for follow-up, and return precautions with the patient. Provided opportunity for questions, patient confirmed understanding and is in agreement with plan.    Final Clinical Impressions(s) / ED Diagnoses   Final diagnoses:  Acute cystitis without hematuria    ED Discharge Orders         Ordered    cephALEXin (KEFLEX) 500 MG capsule  2 times daily     03/16/19 [address], [place], PA-C 03/16/19 1445    Daleen Bo, MD 03/17/19 [phone]

## 2019-03-20 LAB — URINE CULTURE: Culture: >=100000 — AB

## 2019-03-21 ENCOUNTER — Telehealth: Payer: Self-pay | Admitting: Emergency Medicine

## 2019-03-21 NOTE — Telephone Encounter (Signed)
Post ED Visit - Positive Culture Follow-up  Culture report reviewed by antimicrobial stewardship pharmacist: [place] Team []  Elenor Quinones, Pharm.D. []  Heide Guile, Pharm.D., BCPS AQ-ID []  Parks Neptune, Pharm.D., BCPS []  Alycia Rossetti, Pharm.D., BCPS []  [place], [place].D., BCPS, AAHIVP []  Legrand Como, Pharm.D., BCPS, AAHIVP []  Salome Arnt, PharmD, BCPS []  Johnnette Gourd, PharmD, BCPS [x]  Hughes Better, PharmD, BCPS []  Leeroy Cha, PharmD []  Laqueta Linden, PharmD, BCPS []  Albertina Parr, PharmD  [place] Team []  Leodis Sias, PharmD []  Lindell Spar, PharmD []  Royetta Asal, PharmD []  Graylin Shiver, Rph []  Rema Fendt) Glennon Mac, PharmD []  Arlyn Dunning, PharmD []  Netta Cedars, PharmD []  Dia Sitter, PharmD []  Leone Haven, PharmD []  Gretta Arab, PharmD []  Theodis Shove, PharmD []  Peggyann Juba, PharmD []  Reuel Boom, PharmD   Positive urine culture Treated with cephalexin, organism sensitive to the same and no further patient follow-up is required at this time.  Hazle Nordmann 03/21/2019, 2:51 PM

## 2019-04-06 ENCOUNTER — Other Ambulatory Visit: Payer: Self-pay

## 2019-04-06 ENCOUNTER — Ambulatory Visit: Payer: MEDICAID | Attending: Internal Medicine

## 2019-04-06 DIAGNOSIS — Z20822 Contact with and (suspected) exposure to covid-19: Secondary | ICD-10-CM

## 2019-04-07 LAB — NOVEL CORONAVIRUS, NAA: SARS-CoV-2, NAA: NOT DETECTED

## 2019-04-20 NOTE — L&D Delivery Note (Addendum)
Delivery Note  Obstetrician: Malachy Chamber  Assistant: none  Pre-Delivery Diagnosis: Pregnancy complicated by: IUFD at 19wks  Post-Delivery Diagnosis: Stillborn infant(s) or Female  Procedure: Spontaneous vaginal delivery  Surgeon: Malachy Chamber   Assistants: none  Anesthesia: IV regional   Episiotomy or Incision: none  Indications for instrumental delivery: none  Infant Wt: not done  Apgars: IUFD  Delivery time 2052, Placenta 2132.  Placenta and Cord:         Mechanism: spontaneous       Description:  complete, 3 vessel cord, membranes intact  Estimated Blood Loss:  less than 100 mL         Specimens: Placenta to patholgoy         Complications:  none         Condition: stable  Blood Type and Rh: --/--/A NEG, A NEG Performed at [facility], [address] [place], [place] [address]  (04/14 1515)  Rubella Immunity Status: No results found for requested labs within last 8760 hours.    Attending Attestation: I performed the procedure.

## 2019-05-10 ENCOUNTER — Other Ambulatory Visit: Payer: Self-pay

## 2019-05-10 ENCOUNTER — Encounter: Payer: Self-pay | Admitting: *Deleted

## 2019-05-10 ENCOUNTER — Ambulatory Visit (INDEPENDENT_AMBULATORY_CARE_PROVIDER_SITE_OTHER): Payer: Self-pay | Admitting: *Deleted

## 2019-05-10 VITALS — BP 126/83 | HR 72 | Wt 298.0 lb

## 2019-05-10 DIAGNOSIS — Z1389 Encounter for screening for other disorder: Secondary | ICD-10-CM | POA: Diagnosis not present

## 2019-05-10 DIAGNOSIS — Z331 Pregnant state, incidental: Secondary | ICD-10-CM

## 2019-05-10 DIAGNOSIS — Z3201 Encounter for pregnancy test, result positive: Secondary | ICD-10-CM

## 2019-05-10 DIAGNOSIS — R8290 Unspecified abnormal findings in urine: Secondary | ICD-10-CM | POA: Diagnosis not present

## 2019-05-10 LAB — POCT URINE PREGNANCY: Preg Test, Ur: POSITIVE — AB

## 2019-05-10 NOTE — Progress Notes (Signed)
   NURSE VISIT- PREGNANCY CONFIRMATION   SUBJECTIVE:  [name] is a 34 y.o. [phone] female at Unknown by uncertain LMP of No LMP recorded (lmp unknown). Patient is pregnant. Here for pregnancy confirmation.  Home pregnancy test: positive x 2  She reports nausea.  She is not taking prenatal vitamins.    OBJECTIVE:  BP 126/83 (BP Location: Left Arm, Patient Position: Sitting, Cuff Size: Normal)   Pulse 72   Wt 298 lb (135.2 kg)   LMP  (LMP Unknown)   BMI 48.10 kg/m   Appears well, in no apparent distress OB History  Gravida Para Term Preterm AB Living  5 3 3   1 3   SAB TAB Ectopic Multiple Live Births          3    # Outcome Date GA Lbr Len/2nd Weight Sex Delivery Anes PTL Lv  5 Current           4 AB 2014          3 Term     M CS-LTranv   LIV  2 Term     F CS-LTranv   LIV  1 Term     M CS-LTranv   LIV    Results for orders placed or performed in visit on 05/10/19 (from the past 24 hour(s))  POCT urine pregnancy   Collection Time: 05/10/19  9:16 AM  Result Value Ref Range   Preg Test, Ur Positive (A) Negative    ASSESSMENT: Positive pregnancy test, Unknown by LMP   Cloudy, Malodorous urine-hx of frequent UTI's  PLAN: Schedule for dating ultrasound in 1-2 weeks Prenatal vitamins: plans to begin OTC ASAP   Nausea medicines: not currently needed   Urine culture sent OB packet given: Yes  05/12/19  05/10/2019 9:16 AM

## 2019-05-10 NOTE — Progress Notes (Signed)
Chart reviewed for nurse visit. Agree with plan of care.  Adline Potter, NP 05/10/2019 11:19 AM

## 2019-05-15 ENCOUNTER — Telehealth: Payer: Self-pay | Admitting: Adult Health

## 2019-05-15 ENCOUNTER — Other Ambulatory Visit: Payer: Self-pay | Admitting: Obstetrics and Gynecology

## 2019-05-15 DIAGNOSIS — O3680X0 Pregnancy with inconclusive fetal viability, not applicable or unspecified: Secondary | ICD-10-CM

## 2019-05-15 LAB — URINE CULTURE

## 2019-05-15 MED ORDER — CEPHALEXIN 500 MG PO CAPS: 500.0000 mg | ORAL_CAPSULE | Freq: Three times a day (TID) | ORAL | 0 refills | Status: DC

## 2019-05-15 NOTE — Telephone Encounter (Signed)
Pt aware that urine + Ecoli, will rx keflex

## 2019-05-16 ENCOUNTER — Other Ambulatory Visit: Payer: Self-pay

## 2019-05-16 ENCOUNTER — Ambulatory Visit (INDEPENDENT_AMBULATORY_CARE_PROVIDER_SITE_OTHER): Payer: Self-pay

## 2019-05-16 DIAGNOSIS — O3680X0 Pregnancy with inconclusive fetal viability, not applicable or unspecified: Secondary | ICD-10-CM

## 2019-05-16 DIAGNOSIS — Z3A01 Less than 8 weeks gestation of pregnancy: Secondary | ICD-10-CM

## 2019-05-16 NOTE — Progress Notes (Signed)
[place] 7+6 wks,single IUP w/ys,positive fht 164 bpm,normal ovaries,crl 15.02 mm

## 2019-06-12 ENCOUNTER — Telehealth: Payer: Self-pay | Admitting: *Deleted

## 2019-06-12 MED ORDER — PROMETHAZINE HCL 25 MG PO TABS: 25.0000 mg | ORAL_TABLET | Freq: Four times a day (QID) | ORAL | 1 refills | Status: DC | PRN

## 2019-06-12 NOTE — Addendum Note (Signed)
Addended by: Cyril Mourning A on: 06/12/2019 05:32 PM   Modules accepted: Orders

## 2019-06-12 NOTE — Telephone Encounter (Signed)
rx phenergan 

## 2019-06-12 NOTE — Telephone Encounter (Signed)
Pt requesting rx for nausea

## 2019-06-19 ENCOUNTER — Other Ambulatory Visit: Payer: Self-pay | Admitting: Obstetrics & Gynecology

## 2019-06-19 DIAGNOSIS — Z3682 Encounter for antenatal screening for nuchal translucency: Secondary | ICD-10-CM

## 2019-06-20 ENCOUNTER — Ambulatory Visit (INDEPENDENT_AMBULATORY_CARE_PROVIDER_SITE_OTHER): Payer: Self-pay | Admitting: Women's Health

## 2019-06-20 ENCOUNTER — Encounter: Payer: Self-pay | Admitting: Women's Health

## 2019-06-20 ENCOUNTER — Other Ambulatory Visit: Payer: Self-pay | Admitting: Obstetrics & Gynecology

## 2019-06-20 ENCOUNTER — Ambulatory Visit: Payer: Self-pay | Admitting: *Deleted

## 2019-06-20 ENCOUNTER — Ambulatory Visit (INDEPENDENT_AMBULATORY_CARE_PROVIDER_SITE_OTHER): Payer: Self-pay

## 2019-06-20 ENCOUNTER — Other Ambulatory Visit: Payer: Self-pay

## 2019-06-20 VITALS — BP 121/79 | HR 85 | Wt 305.0 lb

## 2019-06-20 DIAGNOSIS — O99341 Other mental disorders complicating pregnancy, first trimester: Secondary | ICD-10-CM

## 2019-06-20 DIAGNOSIS — Z6841 Body Mass Index (BMI) 40.0 and over, adult: Secondary | ICD-10-CM | POA: Diagnosis not present

## 2019-06-20 DIAGNOSIS — Z3A12 12 weeks gestation of pregnancy: Secondary | ICD-10-CM

## 2019-06-20 DIAGNOSIS — Z3481 Encounter for supervision of other normal pregnancy, first trimester: Secondary | ICD-10-CM

## 2019-06-20 DIAGNOSIS — Z3480 Encounter for supervision of other normal pregnancy, unspecified trimester: Secondary | ICD-10-CM

## 2019-06-20 DIAGNOSIS — O99331 Smoking (tobacco) complicating pregnancy, first trimester: Secondary | ICD-10-CM

## 2019-06-20 DIAGNOSIS — R87619 Unspecified abnormal cytological findings in specimens from cervix uteri: Secondary | ICD-10-CM | POA: Insufficient documentation

## 2019-06-20 DIAGNOSIS — F418 Other specified anxiety disorders: Secondary | ICD-10-CM | POA: Insufficient documentation

## 2019-06-20 DIAGNOSIS — Z3682 Encounter for antenatal screening for nuchal translucency: Secondary | ICD-10-CM

## 2019-06-20 DIAGNOSIS — F17200 Nicotine dependence, unspecified, uncomplicated: Secondary | ICD-10-CM

## 2019-06-20 LAB — POCT URINALYSIS DIPSTICK OB
Blood, UA: NEGATIVE
Glucose, UA: NEGATIVE
Ketones, UA: NEGATIVE
Leukocytes, UA: NEGATIVE
Nitrite, UA: NEGATIVE
POC,PROTEIN,UA: NEGATIVE

## 2019-06-20 MED ORDER — SERTRALINE HCL 25 MG PO TABS: 25.0000 mg | ORAL_TABLET | Freq: Every day | ORAL | 6 refills | Status: DC

## 2019-06-20 MED ORDER — BLOOD PRESSURE MONITOR MISC: 0 refills | Status: DC

## 2019-06-20 NOTE — Progress Notes (Signed)
INITIAL OBSTETRICAL VISIT Patient name: [name] MRN [id number]  Date of birth: [birth year] Chief Complaint:   Initial Prenatal Visit  History of Present Illness:   [name] is a 34 y.o. G7P3013 Caucasian female at [redacted]w[redacted]d by 7wk u/s, with an Estimated Date of Delivery: 12/27/19 being seen today for her initial obstetrical visit.   Her obstetrical history is significant for prev c/s x 3.   Today she reports depression w/ some anxiety, never been dx, sleeps too much, good appetite, still finds joy in some things, denies SI, does not have good support at home, denies abuse, states she feels safe in her home. She would like to try some medication. Declines counseling/therapy. Smoker: 1/2ppd down to 2-3/day Daily headaches, takes 2 BC Headache tabs daily No LMP recorded (lmp unknown). Patient is pregnant. Last pap 10/04/16. Results were: abnormal  LSIL w/ +HRHPV>wants to do pap next visit Review of Systems:   Pertinent items are noted in HPI Denies cramping/contractions, leakage of fluid, vaginal bleeding, abnormal vaginal discharge w/ itching/odor/irritation, headaches, visual changes, shortness of breath, chest pain, abdominal pain, severe nausea/vomiting, or problems with urination or bowel movements unless otherwise stated above.  Pertinent History Reviewed:  Reviewed past medical,surgical, social, obstetrical and family history.  Reviewed problem list, medications and allergies. OB History  Gravida Para Term Preterm AB Living  5 3 3   1 3   SAB TAB Ectopic Multiple Live Births          3    # Outcome Date GA Lbr Len/2nd Weight Sex Delivery Anes PTL Lv  5 Current           4 AB 2014          3 Term 08/22/09 [redacted]w[redacted]d  7 lb 6 oz (3.345 kg) M CS-LTranv Spinal N LIV  2 Term 12/02/05 [redacted]w[redacted]d  7 lb 1 oz (3.204 kg) F CS-LTranv Spinal N LIV  1 Term 05/26/04 [redacted]w[redacted]d  6 lb 14 oz (3.118 kg) M CS-LTranv Spinal N LIV   Physical Assessment:   Vitals:   06/20/19 1424  BP: 121/79  Pulse: 85    Weight: (!) 305 lb (138.3 kg)  Body mass index is 49.23 kg/m.       Physical Examination:  General appearance - well appearing, and in no distress  Mental status - alert, oriented to person, place, and time  Psych:  She has a normal mood and affect  Skin - warm and dry, normal color, no suspicious lesions noted  Chest - effort normal, all lung fields clear to auscultation bilaterally  Heart - normal rate and regular rhythm  Abdomen - soft, nontender  Extremities:  No swelling or varicosities noted  Thin prep pap is not done>wants to do at next visit  TODAY'S NT [place] TA/TV: 12+6 WKS,measurements c/w dates,fhr 171 bpm,crl 66.34 mm,normal ovaries,NB not visualized,limited view because of pt body habitus and fetal position,unable to obtain NT  Results for orders placed or performed in visit on 06/20/19 (from the past 24 hour(s))  POC Urinalysis Dipstick OB   Collection Time: 06/20/19  2:41 PM  Result Value Ref Range   Color, UA     Clarity, UA     Glucose, UA Negative Negative   Bilirubin, UA     Ketones, UA neg    Spec Grav, UA     Blood, UA neg    pH, UA     POC,PROTEIN,UA Negative Negative, Trace, Small (1+), Moderate (2+), Large (3+),  4+   Urobilinogen, UA     Nitrite, UA neg    Leukocytes, UA Negative Negative   Appearance     Odor      Assessment & Plan:  1) Low-Risk Pregnancy [id number] at [redacted]w[redacted]d with an Estimated Date of Delivery: 12/27/19   2) Initial OB visit  3) Prev c/s x 3> for RCS  4) H/O bladder reconstructive surgery  5) Obesity  6) Dep/anx> rx zoloft 25mg  daily, f/u 4wks, declines counseling  7) Abnormal pap, past due for f/u> wants to do pap next visit  8) Smoker Smokes 2-3/day, down from 1/2ppd, counseled x 3-58mins, advised cessation, discussed risks to fetus while pregnant, to infant pp, and to herself. Offered QuitlineNC, declined.  9) Headaches> currently taking 2 BC tabs daily, stop taking today! Gave printed prevention/relief measures   Meds:   Meds ordered this encounter  Medications  . Blood Pressure Monitor MISC    Sig: For regular home bp monitoring during pregnancy    Dispense:  1 each    Refill:  0    Z34.80  . sertraline (ZOLOFT) 25 MG tablet    Sig: Take 1 tablet (25 mg total) by mouth daily.    Dispense:  30 tablet    Refill:  6    Order Specific Question:   Supervising Provider    Answer:   9m H [[id number]]    Initial labs obtained Continue prenatal vitamins Reviewed n/v relief measures and warning s/s to report Reviewed recommended weight gain based on pre-gravid BMI Encouraged well-balanced diet Genetic Screening discussed: requested AFP (unable to get nt/nasal bone today) and maternit21 Cystic fibrosis, SMA, Fragile X screening discussed declined Ultrasound discussed; fetal survey: requested CCNC completed>PCM not here, form faxed The nature of [facility] with multiple MDs and other Advanced Practice Providers was explained to patient; also emphasized that fellows, residents, and students are part of our team. Does not have home bp cuff. Rx faxed to [facility]. Check bp weekly, let [employer] know if >140/90.   Follow-up: Return in about 4 weeks (around 07/18/2019) for LROB, MyChart Video, CNM.   Orders Placed This Encounter  Procedures  . Urine Culture  . GC/Chlamydia Probe Amp  . Hemoglobinopathy evaluation  . Hepatitis C antibody  . Obstetric Panel, Including HIV  . MaterniT 21 plus Core, Blood  . Hemoglobin A1c  . Pain Management Screening Profile (10S)  . POC Urinalysis Dipstick OB    07/20/2019 CNM, [facility] 06/20/2019 3:33 PM

## 2019-06-20 NOTE — Patient Instructions (Signed)
[name], I greatly value your feedback.  If you receive a survey following your visit with [place] today, we appreciate you taking the time to fill it out.  Thanks, Joellyn Haff, CNM, [facility]  [facility] HAS MOVED!!! It is now [facility] ([address] [place], [place] [address]) Entrance located off of [address] Free 24/7 valet parking   Nausea & Vomiting  Have saltine crackers or pretzels by your bed and eat a few bites before you raise your head out of bed in the morning  Eat small frequent meals throughout the day instead of large meals  Drink plenty of fluids throughout the day to stay hydrated, just don't drink a lot of fluids with your meals.  This can make your stomach fill up faster making you feel sick  Do not brush your teeth right after you eat  Products with real ginger are good for nausea, like ginger ale and ginger hard candy Make sure it says made with real ginger!  Sucking on sour candy like lemon heads is also good for nausea  If your prenatal vitamins make you nauseated, take them at night so you will sleep through the nausea  Sea Bands  If you feel like you need medicine for the nausea & vomiting please let us know  If you are unable to keep any fluids or food down please let us know   Constipation  Drink plenty of fluid, preferably water, throughout the day  Eat foods high in fiber such as fruits, vegetables, and grains  Exercise, such as walking, is a good way to keep your bowels regular  Drink warm fluids, especially warm prune juice, or decaf coffee  Eat a 1/2 cup of real oatmeal (not instant), 1/2 cup applesauce, and 1/2-1 cup warm prune juice every day  If needed, you may take Colace (docusate sodium) stool softener once or twice a day to help keep the stool soft.   If you still are having problems with constipation, you may take Miralax once daily as needed to help keep your bowels regular.   Home Blood  Pressure Monitoring for Patients   Your provider has recommended that you check your blood pressure (BP) at least once a week at home. If you do not have a blood pressure cuff at home, one will be provided for you. Contact your provider if you have not received your monitor within 1 week.   Helpful Tips for Accurate Home Blood Pressure Checks  . Don't smoke, exercise, or drink caffeine 30 minutes before checking your BP . Use the restroom before checking your BP (a full bladder can raise your pressure) . Relax in a comfortable upright chair . Feet on the ground . Left arm resting comfortably on a flat surface at the level of your heart . Legs uncrossed . Back supported . Sit quietly and don't talk . Place the cuff on your bare arm . Adjust snuggly, so that only two fingertips can fit between your skin and the top of the cuff . Check 2 readings separated by at least one minute . Keep a log of your BP readings . For a visual, please reference this diagram: http://ccnc.care/bpdiagram  Provider Name: [facility]     Phone: [phone]  Zone 1: ALL CLEAR  Continue to monitor your symptoms:  . BP reading is less than 140 (top number) or less than 90 (bottom number)  . No right upper stomach  pain . No headaches or seeing spots . No feeling nauseated or throwing up . No swelling in face and hands  Zone 2: CAUTION Call your doctor's office for any of the following:  . BP reading is greater than 140 (top number) or greater than 90 (bottom number)  . Stomach pain under your ribs in the middle or right side . Headaches or seeing spots . Feeling nauseated or throwing up . Swelling in face and hands  Zone 3: EMERGENCY  Seek immediate medical care if you have any of the following:  . BP reading is greater than160 (top number) or greater than 110 (bottom number) . Severe headaches not improving with Tylenol . Serious difficulty catching your breath . Any worsening symptoms from Zone  2    First Trimester of Pregnancy The first trimester of pregnancy is from week 1 until the end of week 12 (months 1 through 3). A week after a sperm fertilizes an egg, the egg will implant on the wall of the uterus. This embryo will begin to develop into a baby. Genes from you and your partner are forming the baby. The female genes determine whether the baby is a boy or a girl. At 6-8 weeks, the eyes and face are formed, and the heartbeat can be seen on ultrasound. At the end of 12 weeks, all the baby's organs are formed.  Now that you are pregnant, you will want to do everything you can to have a healthy baby. Two of the most important things are to get good prenatal care and to follow your health care provider's instructions. Prenatal care is all the medical care you receive before the baby's birth. This care will help prevent, find, and treat any problems during the pregnancy and childbirth. BODY CHANGES Your body goes through many changes during pregnancy. The changes vary from woman to woman.   You may gain or lose a couple of pounds at first.  You may feel sick to your stomach (nauseous) and throw up (vomit). If the vomiting is uncontrollable, call your health care provider.  You may tire easily.  You may develop headaches that can be relieved by medicines approved by your health care provider.  You may urinate more often. Painful urination may mean you have a bladder infection.  You may develop heartburn as a result of your pregnancy.  You may develop constipation because certain hormones are causing the muscles that push waste through your intestines to slow down.  You may develop hemorrhoids or swollen, bulging veins (varicose veins).  Your breasts may begin to grow larger and become tender. Your nipples may stick out more, and the tissue that surrounds them (areola) may become darker.  Your gums may bleed and may be sensitive to brushing and flossing.  Dark spots or blotches  (chloasma, mask of pregnancy) may develop on your face. This will likely fade after the baby is born.  Your menstrual periods will stop.  You may have a loss of appetite.  You may develop cravings for certain kinds of food.  You may have changes in your emotions from day to day, such as being excited to be pregnant or being concerned that something may go wrong with the pregnancy and baby.  You may have more vivid and strange dreams.  You may have changes in your hair. These can include thickening of your hair, rapid growth, and changes in texture. Some women also have hair loss during or after pregnancy, or hair that  feels dry or thin. Your hair will most likely return to normal after your baby is born. WHAT TO EXPECT AT YOUR PRENATAL VISITS During a routine prenatal visit:  You will be weighed to make sure you and the baby are growing normally.  Your blood pressure will be taken.  Your abdomen will be measured to track your baby's growth.  The fetal heartbeat will be listened to starting around week 10 or 12 of your pregnancy.  Test results from any previous visits will be discussed. Your health care provider may ask you:  How you are feeling.  If you are feeling the baby move.  If you have had any abnormal symptoms, such as leaking fluid, bleeding, severe headaches, or abdominal cramping.  If you have any questions. Other tests that may be performed during your first trimester include:  Blood tests to find your blood type and to check for the presence of any previous infections. They will also be used to check for low iron levels (anemia) and Rh antibodies. Later in the pregnancy, blood tests for diabetes will be done along with other tests if problems develop.  Urine tests to check for infections, diabetes, or protein in the urine.  An ultrasound to confirm the proper growth and development of the baby.  An amniocentesis to check for possible genetic problems.  Fetal  screens for spina bifida and Down syndrome.  You may need other tests to make sure you and the baby are doing well. HOME CARE INSTRUCTIONS  Medicines  Follow your health care provider's instructions regarding medicine use. Specific medicines may be either safe or unsafe to take during pregnancy.  Take your prenatal vitamins as directed.  If you develop constipation, try taking a stool softener if your health care provider approves. Diet  Eat regular, well-balanced meals. Choose a variety of foods, such as meat or vegetable-based protein, fish, milk and low-fat dairy products, vegetables, fruits, and whole grain breads and cereals. Your health care provider will help you determine the amount of weight gain that is right for you.  Avoid raw meat and uncooked cheese. These carry germs that can cause birth defects in the baby.  Eating four or five small meals rather than three large meals a day may help relieve nausea and vomiting. If you start to feel nauseous, eating a few soda crackers can be helpful. Drinking liquids between meals instead of during meals also seems to help nausea and vomiting.  If you develop constipation, eat more high-fiber foods, such as fresh vegetables or fruit and whole grains. Drink enough fluids to keep your urine clear or pale yellow. Activity and Exercise  Exercise only as directed by your health care provider. Exercising will help you:  Control your weight.  Stay in shape.  Be prepared for labor and delivery.  Experiencing pain or cramping in the lower abdomen or low back is a good sign that you should stop exercising. Check with your health care provider before continuing normal exercises.  Try to avoid standing for long periods of time. Move your legs often if you must stand in one place for a long time.  Avoid heavy lifting.  Wear low-heeled shoes, and practice good posture.  You may continue to have sex unless your health care provider directs you  otherwise. Relief of Pain or Discomfort  Wear a good support bra for breast tenderness.    Take warm sitz baths to soothe any pain or discomfort caused by hemorrhoids. Use hemorrhoid cream  if your health care provider approves.    Rest with your legs elevated if you have leg cramps or low back pain.  If you develop varicose veins in your legs, wear support hose. Elevate your feet for 15 minutes, 3-4 times a day. Limit salt in your diet. Prenatal Care  Schedule your prenatal visits by the twelfth week of pregnancy. They are usually scheduled monthly at first, then more often in the last 2 months before delivery.  Write down your questions. Take them to your prenatal visits.  Keep all your prenatal visits as directed by your health care provider. Safety  Wear your seat belt at all times when driving.  Make a list of emergency phone numbers, including numbers for family, friends, the hospital, and police and fire departments. General Tips  Ask your health care provider for a referral to a local prenatal education class. Begin classes no later than at the beginning of month 6 of your pregnancy.  Ask for help if you have counseling or nutritional needs during pregnancy. Your health care provider can offer advice or refer you to specialists for help with various needs.  Do not use hot tubs, steam rooms, or saunas.  Do not douche or use tampons or scented sanitary pads.  Do not cross your legs for long periods of time.  Avoid cat litter boxes and soil used by cats. These carry germs that can cause birth defects in the baby and possibly loss of the fetus by miscarriage or stillbirth.  Avoid all smoking, herbs, alcohol, and medicines not prescribed by your health care provider. Chemicals in these affect the formation and growth of the baby.  Schedule a dentist appointment. At home, brush your teeth with a soft toothbrush and be gentle when you floss. SEEK MEDICAL CARE IF:   You have  dizziness.  You have mild pelvic cramps, pelvic pressure, or nagging pain in the abdominal area.  You have persistent nausea, vomiting, or diarrhea.  You have a bad smelling vaginal discharge.  You have pain with urination.  You notice increased swelling in your face, hands, legs, or ankles. SEEK IMMEDIATE MEDICAL CARE IF:   You have a fever.  You are leaking fluid from your vagina.  You have spotting or bleeding from your vagina.  You have severe abdominal cramping or pain.  You have rapid weight gain or loss.  You vomit blood or material that looks like coffee grounds.  You are exposed to [place] measles and have never had them.  You are exposed to fifth disease or chickenpox.  You develop a severe headache.  You have shortness of breath.  You have any kind of trauma, such as from a fall or a car accident. Document Released: 03/30/2001 Document Revised: 08/20/2013 Document Reviewed: 02/13/2013 [facility] Patient Information 2015 [place], [place]. This information is not intended to replace advice given to you by your health care provider. Make sure you discuss any questions you have with your health care provider.  Coronavirus (COVID-19) Are you at risk?  Are you at risk for the Coronavirus (COVID-19)?  To be considered HIGH RISK for Coronavirus (COVID-19), you have to meet the following criteria:  . Traveled to [place], [place], [place], [place] or [place]; or in the [place] to [place], [place], [place], or [place]; and have fever, cough, and shortness of breath within the last 2 weeks of travel OR . Been in close contact with a person diagnosed with COVID-19 within the last 2  weeks and have fever, cough, and shortness of breath . IF YOU DO NOT MEET THESE CRITERIA, YOU ARE CONSIDERED LOW RISK FOR COVID-19.  What to do if you are HIGH RISK for COVID-19?  Marland Kitchen If you are having a medical emergency, call 911. . Seek medical care right away. Before you go to a  doctor's office, urgent care or emergency department, call ahead and tell them about your recent travel, contact with someone diagnosed with COVID-19, and your symptoms. You should receive instructions from your physician's office regarding next steps of care.  . When you arrive at healthcare provider, tell the healthcare staff immediately you have returned from visiting [place], [place], [place], [place] or [place]; or traveled in the [place] to [place], [place], [place], or [place]; in the last two weeks or you have been in close contact with a person diagnosed with COVID-19 in the last 2 weeks.   . Tell the health care staff about your symptoms: fever, cough and shortness of breath. . After you have been seen by a medical provider, you will be either: o Tested for (COVID-19) and discharged home on quarantine except to seek medical care if symptoms worsen, and asked to  - Stay home and avoid contact with others until you get your results (4-5 days)  - Avoid travel on public transportation if possible (such as bus, train, or airplane) or o Sent to the Emergency Department by EMS for evaluation, COVID-19 testing, and possible admission depending on your condition and test results.  What to do if you are LOW RISK for COVID-19?  Reduce your risk of any infection by using the same precautions used for avoiding the common cold or flu:  Marland Kitchen Wash your hands often with soap and warm water for at least 20 seconds.  If soap and water are not readily available, use an alcohol-based hand sanitizer with at least 60% alcohol.  . If coughing or sneezing, cover your mouth and nose by coughing or sneezing into the elbow areas of your shirt or coat, into a tissue or into your sleeve (not your hands). . Avoid shaking hands with others and consider head nods or verbal greetings only. . Avoid touching your eyes, nose, or mouth with unwashed hands.  . Avoid close contact with people who are sick. . Avoid  places or events with large numbers of people in one location, like concerts or sporting events. . Carefully consider travel plans you have or are making. . If you are planning any travel outside or inside the [place], visit the CDC's Travelers' Health webpage for the latest health notices. . If you have some symptoms but not all symptoms, continue to monitor at home and seek medical attention if your symptoms worsen. . If you are having a medical emergency, call 911.   [place] / e-Visit: [email or web address]         [facility]: [facility]: [id number]                   [facility]: [phone]

## 2019-06-20 NOTE — Progress Notes (Signed)
[place] TA/TV: 12+6 WKS,measurements c/w dates,fhr 171 bpm,crl 66.34 mm,normal ovaries,NB not visualized,limited view because of pt body habitus and fetal position,unable to obtain NT

## 2019-06-21 ENCOUNTER — Encounter: Payer: Self-pay | Admitting: Women's Health

## 2019-06-21 ENCOUNTER — Telehealth: Payer: Self-pay | Admitting: *Deleted

## 2019-06-21 DIAGNOSIS — F1290 Cannabis use, unspecified, uncomplicated: Secondary | ICD-10-CM | POA: Insufficient documentation

## 2019-06-21 DIAGNOSIS — Z6791 Unspecified blood type, Rh negative: Secondary | ICD-10-CM | POA: Insufficient documentation

## 2019-06-21 LAB — PMP SCREEN PROFILE (10S), URINE
Amphetamine Scrn, Ur: NEGATIVE ng/mL
BARBITURATE SCREEN URINE: NEGATIVE ng/mL
BENZODIAZEPINE SCREEN, URINE: NEGATIVE ng/mL
CANNABINOIDS UR QL SCN: POSITIVE ng/mL — AB
Cocaine (Metab) Scrn, Ur: NEGATIVE ng/mL
Creatinine(Crt), U: 57.4 mg/dL (ref 20.0–300.0)
Methadone Screen, Urine: NEGATIVE ng/mL
OXYCODONE+OXYMORPHONE UR QL SCN: NEGATIVE ng/mL
Opiate Scrn, Ur: NEGATIVE ng/mL
Ph of Urine: 6.8 (ref 4.5–8.9)
Phencyclidine Qn, Ur: NEGATIVE ng/mL
Propoxyphene Scrn, Ur: NEGATIVE ng/mL

## 2019-06-21 LAB — MED LIST OPTION NOT SELECTED

## 2019-06-21 NOTE — Telephone Encounter (Signed)
Pt informed A1C slightly elevated.  Kim requests she come in for 2 hour glucola to test for GDM.  Pt verbalized understanding with no further questions.  Appt made.

## 2019-06-22 LAB — GC/CHLAMYDIA PROBE AMP
Chlamydia trachomatis, NAA: NEGATIVE
Neisseria Gonorrhoeae by PCR: NEGATIVE

## 2019-06-24 LAB — URINE CULTURE

## 2019-06-25 ENCOUNTER — Other Ambulatory Visit: Payer: Self-pay | Admitting: Women's Health

## 2019-06-25 ENCOUNTER — Encounter: Payer: Self-pay | Admitting: Women's Health

## 2019-06-25 DIAGNOSIS — R8271 Bacteriuria: Secondary | ICD-10-CM | POA: Insufficient documentation

## 2019-06-25 LAB — MATERNIT 21 PLUS CORE, BLOOD
Fetal Fraction: 7
Result (T21): NEGATIVE
Trisomy 13 (Patau syndrome): NEGATIVE
Trisomy 18 (Edwards syndrome): NEGATIVE
Trisomy 21 (Down syndrome): NEGATIVE

## 2019-06-25 LAB — OBSTETRIC PANEL, INCLUDING HIV
Antibody Screen: NEGATIVE
Basophils Absolute: 0.1 10*3/uL (ref 0.0–0.2)
Basos: 0 %
EOS (ABSOLUTE): 0.2 10*3/uL (ref 0.0–0.4)
Eos: 2 %
HIV Screen 4th Generation wRfx: NONREACTIVE
Hematocrit: 40.7 % (ref 34.0–46.6)
Hemoglobin: 13.8 g/dL (ref 11.1–15.9)
Hepatitis B Surface Ag: NEGATIVE
Immature Grans (Abs): 0 10*3/uL (ref 0.0–0.1)
Immature Granulocytes: 0 %
Lymphocytes Absolute: 3.4 10*3/uL — ABNORMAL HIGH (ref 0.7–3.1)
Lymphs: 27 %
MCH: 27.8 pg (ref 26.6–33.0)
MCHC: 33.9 g/dL (ref 31.5–35.7)
MCV: 82 fL (ref 79–97)
Monocytes Absolute: 0.8 10*3/uL (ref 0.1–0.9)
Monocytes: 6 %
Neutrophils Absolute: 8.3 10*3/uL — ABNORMAL HIGH (ref 1.4–7.0)
Neutrophils: 65 %
Platelets: 320 10*3/uL (ref 150–450)
RBC: 4.96 x10E6/uL (ref 3.77–5.28)
RDW: 13.9 % (ref 11.7–15.4)
RPR Ser Ql: NONREACTIVE
Rh Factor: NEGATIVE
Rubella Antibodies, IGG: 1.21 index (ref >0.99)
WBC: 12.7 10*3/uL — ABNORMAL HIGH (ref 3.4–10.8)

## 2019-06-25 LAB — HGB FRACTIONATION CASCADE
Hgb A2: 2.3 % (ref 1.8–3.2)
Hgb A: 97.7 % (ref 96.4–98.8)
Hgb F: 0 % (ref 0.0–2.0)
Hgb S: 0 %

## 2019-06-25 LAB — HEMOGLOBIN A1C
Est. average glucose Bld gHb Est-mCnc: 120 mg/dL
Hgb A1c MFr Bld: 5.8 % — ABNORMAL HIGH (ref 4.8–5.6)

## 2019-06-25 LAB — HEPATITIS C ANTIBODY: Hep C Virus Ab: <0.1 s/co ratio (ref 0.0–0.9)

## 2019-06-25 MED ORDER — PENICILLIN V POTASSIUM 500 MG PO TABS: 500.0000 mg | ORAL_TABLET | Freq: Four times a day (QID) | ORAL | 0 refills | Status: DC

## 2019-06-26 ENCOUNTER — Other Ambulatory Visit: Payer: Self-pay

## 2019-06-26 DIAGNOSIS — Z3A27 27 weeks gestation of pregnancy: Secondary | ICD-10-CM | POA: Diagnosis not present

## 2019-06-26 DIAGNOSIS — Z3A13 13 weeks gestation of pregnancy: Secondary | ICD-10-CM

## 2019-06-27 LAB — GLUCOSE TOLERANCE, 2 HOURS W/ 1HR
Glucose, 1 hour: 165 mg/dL (ref 65–179)
Glucose, 2 hour: 90 mg/dL (ref 65–152)
Glucose, Fasting: 88 mg/dL (ref 65–91)

## 2019-07-18 ENCOUNTER — Telehealth (INDEPENDENT_AMBULATORY_CARE_PROVIDER_SITE_OTHER): Payer: Self-pay | Admitting: Family Medicine

## 2019-07-18 VITALS — BP 159/99 | HR 83

## 2019-07-18 DIAGNOSIS — O360120 Maternal care for anti-D [Rh] antibodies, second trimester, not applicable or unspecified: Secondary | ICD-10-CM

## 2019-07-18 DIAGNOSIS — Z3480 Encounter for supervision of other normal pregnancy, unspecified trimester: Secondary | ICD-10-CM

## 2019-07-18 DIAGNOSIS — O99342 Other mental disorders complicating pregnancy, second trimester: Secondary | ICD-10-CM

## 2019-07-18 DIAGNOSIS — O99332 Smoking (tobacco) complicating pregnancy, second trimester: Secondary | ICD-10-CM

## 2019-07-18 DIAGNOSIS — F17200 Nicotine dependence, unspecified, uncomplicated: Secondary | ICD-10-CM

## 2019-07-18 DIAGNOSIS — Z98891 History of uterine scar from previous surgery: Secondary | ICD-10-CM

## 2019-07-18 DIAGNOSIS — R7303 Prediabetes: Secondary | ICD-10-CM

## 2019-07-18 DIAGNOSIS — O0990 Supervision of high risk pregnancy, unspecified, unspecified trimester: Secondary | ICD-10-CM

## 2019-07-18 DIAGNOSIS — Z6841 Body Mass Index (BMI) 40.0 and over, adult: Secondary | ICD-10-CM

## 2019-07-18 DIAGNOSIS — F418 Other specified anxiety disorders: Secondary | ICD-10-CM

## 2019-07-18 DIAGNOSIS — Z3A16 16 weeks gestation of pregnancy: Secondary | ICD-10-CM

## 2019-07-18 DIAGNOSIS — O24312 Unspecified pre-existing diabetes mellitus in pregnancy, second trimester: Secondary | ICD-10-CM

## 2019-07-18 DIAGNOSIS — Z6791 Unspecified blood type, Rh negative: Secondary | ICD-10-CM

## 2019-07-18 DIAGNOSIS — O34219 Maternal care for unspecified type scar from previous cesarean delivery: Secondary | ICD-10-CM

## 2019-07-18 MED ORDER — ASPIRIN EC 81 MG PO TBEC: 81.0000 mg | DELAYED_RELEASE_TABLET | Freq: Every day | ORAL | 2 refills | Status: DC

## 2019-07-18 MED ORDER — SERTRALINE HCL 25 MG PO TABS: 25.0000 mg | ORAL_TABLET | Freq: Every day | ORAL | 3 refills | Status: DC

## 2019-07-18 NOTE — Progress Notes (Signed)
TELEHEALTH VIRTUAL OBSTETRICS VISIT ENCOUNTER NOTE  I connected with [name] on 07/18/19 at  1:30 PM EDT by video and phone at home and verified that I am speaking with the correct person using two identifiers.   I discussed the limitations, risks, security and privacy concerns of performing an evaluation and management service by telephone and the availability of in person appointments. I also discussed with the patient that there may be a patient responsible charge related to this service. The patient expressed understanding and agreed to proceed.  Subjective:  [name] is a 34 y.o. [id number] at [redacted]w[redacted]d being followed for ongoing prenatal care.  She is currently monitored for the following issues for this high-risk pregnancy and has Encounter for supervision of other normal pregnancy, unspecified trimester; Smoker; Abnormal Pap smear of cervix; Depression with anxiety; Marijuana use; Rh negative state in antepartum period; and Asymptomatic bacteriuria during pregnancy in first trimester on their problem list.  Patient reports feeling tired. She has had headaches but reports she has always dealt with headaches. Reports fetal movement. Denies any contractions, bleeding or leaking of fluid.   The following portions of the patient's history were reviewed and updated as appropriate: allergies, current medications, past family history, past medical history, past social history, past surgical history and problem list.   Objective:   Vitals:   07/18/19 1341 07/18/19 1348  BP: (!) 151/101 (!) 159/99  Pulse: 83     General:  Alert, oriented and cooperative.   Mental Status: Normal mood and affect perceived. Normal judgment and thought content.  Rest of physical exam deferred due to type of encounter  Assessment and Plan:  Pregnancy: [id number] at [redacted]w[redacted]d [name] was seen today for routine prenatal visit.  Diagnoses and all orders for this visit:  Supervision of high risk pregnancy,  antepartum -     aspirin EC 81 MG tablet; Take 1 tablet (81 mg total) by mouth daily. Take after 12 weeks for prevention of preeclampsia later in pregnancy -     US OB Comp + 14 Wk; Future       - BP's elevated x2 and patient reports they have been similar readings ever since receiving her BP cuff. Will have her come in this week to check accuracy with office cuff as BP normal range during initial visit. If elevated, patient needs Labetalol for cHTN       - Aspirin initiated today for high risk pregnancy (obesity, possible cHTN)       - Consider CMP and Pr/Cr at next visit       - Needs urine cx test of cure at next visit   BMI 45.0-49.9, adult ([place])       - Limit weight gain. Aspirin as above  Smoker       - Encouraged cessation as able  Depression with anxiety -     Refilled: sertraline (ZOLOFT) 25 MG tablet; Take 1 tablet (25 mg total) by mouth daily. -  Reports some improvement in mood possibly in last week  History of cesarean section       - Plan for repeat at 39w  Prediabetes       - Hgb A1c 5.8; early GTT WNL  Rh negative state in antepartum period       - Rhogam at 28w  No future appointments.  Preterm labor symptoms and general obstetric precautions including but not limited to vaginal bleeding, contractions, leaking of fluid and fetal movement were reviewed in  detail with the patient.  I discussed the assessment and treatment plan with the patient. The patient was provided an opportunity to ask questions and all were answered. The patient agreed with the plan and demonstrated an understanding of the instructions. The patient was advised to call back or seek an in-person office evaluation/go to [facility] at [facility] for any urgent or concerning symptoms. Please refer to After Visit Summary for other counseling recommendations.   I provided 20 minutes of non-face-to-face time during this encounter.  No follow-ups on file.  No future appointments.  Joselyn Arrow, MD [facility] [employer], [facility]

## 2019-07-25 ENCOUNTER — Other Ambulatory Visit: Payer: Self-pay | Admitting: Adult Health

## 2019-07-31 ENCOUNTER — Encounter: Payer: Self-pay | Admitting: Women's Health

## 2019-07-31 ENCOUNTER — Other Ambulatory Visit (INDEPENDENT_AMBULATORY_CARE_PROVIDER_SITE_OTHER): Payer: Medicaid Other

## 2019-07-31 ENCOUNTER — Ambulatory Visit (INDEPENDENT_AMBULATORY_CARE_PROVIDER_SITE_OTHER): Payer: Medicaid Other | Admitting: Women's Health

## 2019-07-31 ENCOUNTER — Other Ambulatory Visit: Payer: Self-pay

## 2019-07-31 VITALS — BP 134/88 | HR 106 | Wt 319.8 lb

## 2019-07-31 DIAGNOSIS — Z3480 Encounter for supervision of other normal pregnancy, unspecified trimester: Secondary | ICD-10-CM

## 2019-07-31 DIAGNOSIS — Z1389 Encounter for screening for other disorder: Secondary | ICD-10-CM

## 2019-07-31 DIAGNOSIS — O2341 Unspecified infection of urinary tract in pregnancy, first trimester: Secondary | ICD-10-CM

## 2019-07-31 DIAGNOSIS — Z3402 Encounter for supervision of normal first pregnancy, second trimester: Secondary | ICD-10-CM

## 2019-07-31 DIAGNOSIS — O3680X0 Pregnancy with inconclusive fetal viability, not applicable or unspecified: Secondary | ICD-10-CM | POA: Diagnosis not present

## 2019-07-31 DIAGNOSIS — Z3A18 18 weeks gestation of pregnancy: Secondary | ICD-10-CM

## 2019-07-31 DIAGNOSIS — O0990 Supervision of high risk pregnancy, unspecified, unspecified trimester: Secondary | ICD-10-CM

## 2019-07-31 DIAGNOSIS — Z331 Pregnant state, incidental: Secondary | ICD-10-CM

## 2019-07-31 DIAGNOSIS — Z3482 Encounter for supervision of other normal pregnancy, second trimester: Secondary | ICD-10-CM

## 2019-07-31 DIAGNOSIS — O021 Missed abortion: Secondary | ICD-10-CM | POA: Insufficient documentation

## 2019-07-31 LAB — POCT URINALYSIS DIPSTICK OB
Blood, UA: NEGATIVE
Glucose, UA: NEGATIVE
Ketones, UA: NEGATIVE
Leukocytes, UA: NEGATIVE
Nitrite, UA: NEGATIVE
POC,PROTEIN,UA: NEGATIVE

## 2019-07-31 NOTE — Progress Notes (Signed)
[place] 18+5 wks,breech,no FHT,anterior placenta gr 0,cx 3.8 cm,svp of fluid 7.2 cm,EFW 244 g 33%,Kim reviewed images during ultrasound

## 2019-07-31 NOTE — Progress Notes (Signed)
LOW-RISK PREGNANCY VISIT Patient name: [name] MRN [id number]  Date of birth: October 18, 1985 Chief Complaint:   High Risk Gestation (numbness both arms but left worse)  History of Present Illness:   [name] is a 34 y.o. [phone] female at [redacted]w[redacted]d with an Estimated Date of Delivery: 12/27/19 being seen today for ongoing management of a low-risk pregnancy.   Today she reports bilateral hand numbness, Lt>Rt. Brought home bp cuff, last visit was virtual and readings were elevated. , so was scheduled to come in to check against our cuff. Some flutters.  .  .   . denies leaking of fluid. Review of Systems:   Pertinent items are noted in HPI Denies abnormal vaginal discharge w/ itching/odor/irritation, headaches, visual changes, shortness of breath, chest pain, abdominal pain, severe nausea/vomiting, or problems with urination or bowel movements unless otherwise stated above. Pertinent History Reviewed:  Reviewed past medical,surgical, social, obstetrical and family history.  Reviewed problem list, medications and allergies. Physical Assessment:   Vitals:   07/31/19 1503 07/31/19 1536  BP: 136/84 134/88  Pulse: (!) 101 (!) 106  Weight: (!) 319 lb 12.8 oz (145.1 kg)   Body mass index is 51.62 kg/m.  BPs w/ pt's cuff Rt 145/88, Lt 149/91 (so off from our cuff)        Physical Examination:   General appearance: Well appearing, and in no distress  Mental status: Alert, oriented to person, place, and time  Skin: Warm & dry  Cardiovascular: Normal heart rate noted  Respiratory: Normal respiratory effort, no distress  Abdomen: Soft, gravid, nontender  Pelvic: Cervical exam deferred         Extremities: Edema: Trace  Fetal Status: Fetal Heart Rate (bpm): 0      w/ doppler, unable to see FCA w/ portable u/s, worked in [employer] for formal u/s and IUFD confirmed.  [place] 18+5 wks,breech,no FHT,anterior placenta gr 0,cx 3.8 cm,svp of fluid 7.2 cm,EFW 244 g 33%,Kim reviewed images during  ultrasound and discussed results with patient. Measurements c/w [redacted]w[redacted]d  Chaperone: n/a    Results for orders placed or performed in visit on 07/31/19 (from the past 24 hour(s))  POC Urinalysis Dipstick OB   Collection Time: 07/31/19  3:11 PM  Result Value Ref Range   Color, UA     Clarity, UA     Glucose, UA Negative Negative   Bilirubin, UA     Ketones, UA neg    Spec Grav, UA     Blood, UA neg    pH, UA     POC,PROTEIN,UA Negative Negative, Trace, Small (1+), Moderate (2+), Large (3+), 4+   Urobilinogen, UA     Nitrite, UA neg    Leukocytes, UA Negative Negative   Appearance     Odor      Assessment & Plan:  1) Low-risk pregnancy [id number] at [redacted]w[redacted]d with an Estimated Date of Delivery: 12/27/19   2) IUFD, discussed w/ [facility], and discussed options w/ pt. Pt to let us know when she's ready to go in for IOL. Reviewed infection/warning s/s, reasons to go in earlier. Notified Dr. Nehemiah Settle, attending on call in case decides to go in tonight.   3) Elevated home bp's> normal w/ [place], 15pt discrepancy in SBP in her cuff   Meds: No orders of the defined types were placed in this encounter.  Labs/procedures today: u/s  Plan:  Will need IOL, pt to let us know when ready  Follow-up: No follow-ups on file.  Orders Placed This Encounter  Procedures  . Urine Culture  . US OB Limited  . POC Urinalysis Dipstick OB   Cheral Marker CNM, [facility] 07/31/2019 5:14 PM

## 2019-07-31 NOTE — Patient Instructions (Addendum)
[name], I greatly value your feedback.  If you receive a survey following your visit with [place] today, we appreciate you taking the time to fill it out.  Thanks, Joellyn Haff, CNM, WHNP-BC  [facility] ([address] [place], [place] [address]) Entrance C, located off of [address] valet parking  Go to [email or web address] to register for FREE online childbirth classes  [place] Pediatricians/Family Doctors:  [facility] [phone]            [facility] [phone]                 [facility] [phone] (usually not accepting new patients unless you have family there already, you are always welcome to call and ask)       [facility] Department [phone]       [facility] Pediatricians/Family Doctors:   [facility]: [phone]  [facility]: [phone]  [facility]: [phone]  [facility] Doctors:   [facility]: [phone]   [facility]: [phone]  [facility] Doctors:  [facility]: [phone]    Home Blood Pressure Monitoring for Patients   Your provider has recommended that you check your blood pressure (BP) at least once a week at home. If you do not have a blood pressure cuff at home, one will be provided for you. Contact your provider if you have not received your monitor within 1 week.   Helpful Tips for Accurate Home Blood Pressure Checks  . Don't smoke, exercise, or drink caffeine 30 minutes before checking your BP . Use the restroom before checking your BP (a full bladder can raise your pressure) . Relax in a comfortable upright chair . Feet on the ground . Left arm resting comfortably on a flat surface at the level of your heart . Legs uncrossed . Back supported . Sit quietly and don't talk . Place the cuff on your bare arm . Adjust snuggly, so  that only two fingertips can fit between your skin and the top of the cuff . Check 2 readings separated by at least one minute . Keep a log of your BP readings . For a visual, please reference this diagram: http://ccnc.care/bpdiagram  Provider Name: [facility]     Phone: [phone]  Zone 1: ALL CLEAR  Continue to monitor your symptoms:  . BP reading is less than 140 (top number) or less than 90 (bottom number)  . No right upper stomach pain . No headaches or seeing spots . No feeling nauseated or throwing up . No swelling in face and hands  Zone 2: CAUTION Call your doctor's office for any of the following:  . BP reading is greater than 140 (top number) or greater than 90 (bottom number)  . Stomach pain under your ribs in the middle or right side . Headaches or seeing spots . Feeling nauseated or throwing up . Swelling in face and hands  Zone 3: EMERGENCY  Seek immediate medical care if you have any of the following:  . BP reading is greater than160 (top number) or greater than 110 (bottom number) . Severe headaches not improving with Tylenol . Serious difficulty catching your breath . Any worsening symptoms from Zone 2     Second Trimester of Pregnancy The second trimester is from week 14 through week 27 (months 4 through 6). The second trimester is often a time when you feel your  best. Your body has adjusted to being pregnant, and you begin to feel better physically. Usually, morning sickness has lessened or quit completely, you may have more energy, and you may have an increase in appetite. The second trimester is also a time when the fetus is growing rapidly. At the end of the sixth month, the fetus is about 9 inches long and weighs about 1 pounds. You will likely begin to feel the baby move (quickening) between 16 and 20 weeks of pregnancy. Body changes during your second trimester Your body continues to go through many changes during your second trimester. The  changes vary from woman to woman.  Your weight will continue to increase. You will notice your lower abdomen bulging out.  You may begin to get stretch marks on your hips, abdomen, and breasts.  You may develop headaches that can be relieved by medicines. The medicines should be approved by your health care provider.  You may urinate more often because the fetus is pressing on your bladder.  You may develop or continue to have heartburn as a result of your pregnancy.  You may develop constipation because certain hormones are causing the muscles that push waste through your intestines to slow down.  You may develop hemorrhoids or swollen, bulging veins (varicose veins).  You may have back pain. This is caused by: ? Weight gain. ? Pregnancy hormones that are relaxing the joints in your pelvis. ? A shift in weight and the muscles that support your balance.  Your breasts will continue to grow and they will continue to become tender.  Your gums may bleed and may be sensitive to brushing and flossing.  Dark spots or blotches (chloasma, mask of pregnancy) may develop on your face. This will likely fade after the baby is born.  A dark line from your belly button to the pubic area (linea nigra) may appear. This will likely fade after the baby is born.  You may have changes in your hair. These can include thickening of your hair, rapid growth, and changes in texture. Some women also have hair loss during or after pregnancy, or hair that feels dry or thin. Your hair will most likely return to normal after your baby is born.  What to expect at prenatal visits During a routine prenatal visit:  You will be weighed to make sure you and the fetus are growing normally.  Your blood pressure will be taken.  Your abdomen will be measured to track your baby's growth.  The fetal heartbeat will be listened to.  Any test results from the previous visit will be discussed.  Your health care  provider may ask you:  How you are feeling.  If you are feeling the baby move.  If you have had any abnormal symptoms, such as leaking fluid, bleeding, severe headaches, or abdominal cramping.  If you are using any tobacco products, including cigarettes, chewing tobacco, and electronic cigarettes.  If you have any questions.  Other tests that may be performed during your second trimester include:  Blood tests that check for: ? Low iron levels (anemia). ? High blood sugar that affects pregnant women (gestational diabetes) between 71 and 28 weeks. ? Rh antibodies. This is to check for a protein on red blood cells (Rh factor).  Urine tests to check for infections, diabetes, or protein in the urine.  An ultrasound to confirm the proper growth and development of the baby.  An amniocentesis to check for possible genetic problems.  Fetal  screens for spina bifida and Down syndrome.  HIV (human immunodeficiency virus) testing. Routine prenatal testing includes screening for HIV, unless you choose not to have this test.  Follow these instructions at home: Medicines  Follow your health care provider's instructions regarding medicine use. Specific medicines may be either safe or unsafe to take during pregnancy.  Take a prenatal vitamin that contains at least 600 micrograms (mcg) of folic acid.  If you develop constipation, try taking a stool softener if your health care provider approves. Eating and drinking  Eat a balanced diet that includes fresh fruits and vegetables, whole grains, good sources of protein such as meat, eggs, or tofu, and low-fat dairy. Your health care provider will help you determine the amount of weight gain that is right for you.  Avoid raw meat and uncooked cheese. These carry germs that can cause birth defects in the baby.  If you have low calcium intake from food, talk to your health care provider about whether you should take a daily calcium  supplement.  Limit foods that are high in fat and processed sugars, such as fried and sweet foods.  To prevent constipation: ? Drink enough fluid to keep your urine clear or pale yellow. ? Eat foods that are high in fiber, such as fresh fruits and vegetables, whole grains, and beans. Activity  Exercise only as directed by your health care provider. Most women can continue their usual exercise routine during pregnancy. Try to exercise for 30 minutes at least 5 days a week. Stop exercising if you experience uterine contractions.  Avoid heavy lifting, wear low heel shoes, and practice good posture.  A sexual relationship may be continued unless your health care provider directs you otherwise. Relieving pain and discomfort  Wear a good support bra to prevent discomfort from breast tenderness.  Take warm sitz baths to soothe any pain or discomfort caused by hemorrhoids. Use hemorrhoid cream if your health care provider approves.  Rest with your legs elevated if you have leg cramps or low back pain.  If you develop varicose veins, wear support hose. Elevate your feet for 15 minutes, 3-4 times a day. Limit salt in your diet. Prenatal Care  Write down your questions. Take them to your prenatal visits.  Keep all your prenatal visits as told by your health care provider. This is important. Safety  Wear your seat belt at all times when driving.  Make a list of emergency phone numbers, including numbers for family, friends, the hospital, and police and fire departments. General instructions  Ask your health care provider for a referral to a local prenatal education class. Begin classes no later than the beginning of month 6 of your pregnancy.  Ask for help if you have counseling or nutritional needs during pregnancy. Your health care provider can offer advice or refer you to specialists for help with various needs.  Do not use hot tubs, steam rooms, or saunas.  Do not douche or use  tampons or scented sanitary pads.  Do not cross your legs for long periods of time.  Avoid cat litter boxes and soil used by cats. These carry germs that can cause birth defects in the baby and possibly loss of the fetus by miscarriage or stillbirth.  Avoid all smoking, herbs, alcohol, and unprescribed drugs. Chemicals in these products can affect the formation and growth of the baby.  Do not use any products that contain nicotine or tobacco, such as cigarettes and e-cigarettes. If you need help  quitting, ask your health care provider.  Visit your dentist if you have not gone yet during your pregnancy. Use a soft toothbrush to brush your teeth and be gentle when you floss. Contact a health care provider if:  You have dizziness.  You have mild pelvic cramps, pelvic pressure, or nagging pain in the abdominal area.  You have persistent nausea, vomiting, or diarrhea.  You have a bad smelling vaginal discharge.  You have pain when you urinate. Get help right away if:  You have a fever.  You are leaking fluid from your vagina.  You have spotting or bleeding from your vagina.  You have severe abdominal cramping or pain.  You have rapid weight gain or weight loss.  You have shortness of breath with chest pain.  You notice sudden or extreme swelling of your face, hands, ankles, feet, or legs.  You have not felt your baby move in over an hour.  You have severe headaches that do not go away when you take medicine.  You have vision changes. Summary  The second trimester is from week 14 through week 27 (months 4 through 6). It is also a time when the fetus is growing rapidly.  Your body goes through many changes during pregnancy. The changes vary from woman to woman.  Avoid all smoking, herbs, alcohol, and unprescribed drugs. These chemicals affect the formation and growth your baby.  Do not use any tobacco products, such as cigarettes, chewing tobacco, and e-cigarettes. If you  need help quitting, ask your health care provider.  Contact your health care provider if you have any questions. Keep all prenatal visits as told by your health care provider. This is important. This information is not intended to replace advice given to you by your health care provider. Make sure you discuss any questions you have with your health care provider. Document Released: 03/30/2001 Document Revised: 09/11/2015 Document Reviewed: 06/06/2012 Elsevier Interactive Patient Education  2017 [place] FLU! Because you are pregnant, we at [facility], along with the Centers for Disease Control (CDC), recommend that you receive the flu vaccine to protect yourself and your baby from the flu. The flu is more likely to cause severe illness in pregnant women than in women of reproductive age who are not pregnant. Changes in the immune system, heart, and lungs during pregnancy make pregnant women (and women up to two weeks postpartum) more prone to severe illness from flu, including illness resulting in hospitalization. Flu also may be harmful for a pregnant woman's developing baby. A common flu symptom is fever, which may be associated with neural tube defects and other adverse outcomes for a developing baby. Getting vaccinated can also help protect a baby after birth from flu. (Mom passes antibodies onto the developing baby during her pregnancy.)  A Flu Vaccine is the Best Protection Against Flu Getting a flu vaccine is the first and most important step in protecting against flu. Pregnant women should get a flu shot and not the live attenuated influenza vaccine (LAIV), also known as nasal spray flu vaccine. Flu vaccines given during pregnancy help protect both the mother and her baby from flu. Vaccination has been shown to reduce the risk of flu-associated acute respiratory infection in pregnant women by up to one-half. A 2018 study showed that getting a flu shot  reduced a pregnant woman's risk of being hospitalized with flu by an average of 40 percent. Pregnant women who get a  flu vaccine are also helping to protect their babies from flu illness for the first several months after their birth, when they are too young to get vaccinated.   A Long Record of Safety for Flu Shots in Pregnant Women Flu shots have been given to millions of pregnant women over many years with a good safety record. There is a lot of evidence that flu vaccines can be given safely during pregnancy; though these data are limited for the first trimester. The CDC recommends that pregnant women get vaccinated during any trimester of their pregnancy. It is very important for pregnant women to get the flu shot.   Other Preventive Actions In addition to getting a flu shot, pregnant women should take the same everyday preventive actions the CDC recommends of everyone, including covering coughs, washing hands often, and avoiding people who are sick.  Symptoms and Treatment If you get sick with flu symptoms call your doctor right away. There are antiviral drugs that can treat flu illness and prevent serious flu complications. The CDC recommends prompt treatment for people who have influenza infection or suspected influenza infection and who are at high risk of serious flu complications, such as people with asthma, diabetes (including gestational diabetes), or heart disease. Early treatment of influenza in hospitalized pregnant women has been shown to reduce the length of the hospital stay.  Symptoms Flu symptoms include fever, cough, sore throat, runny or stuffy nose, body aches, headache, chills and fatigue. Some people may also have vomiting and diarrhea. People may be infected with the flu and have respiratory symptoms without a fever.  Early Treatment is Important for Pregnant Women Treatment should begin as soon as possible because antiviral drugs work best when started early (within 48 hours  after symptoms start). Antiviral drugs can make your flu illness milder and make you feel better faster. They may also prevent serious health problems that can result from flu illness. Oral oseltamivir (Tamiflu) is the preferred treatment for pregnant women because it has the most studies available to suggest that it is safe and beneficial. Antiviral drugs require a prescription from your provider. Having a fever caused by flu infection or other infections early in pregnancy may be linked to birth defects in a baby. In addition to taking antiviral drugs, pregnant women who get a fever should treat their fever with Tylenol (acetaminophen) and contact their provider immediately.  When to Seek Emergency Medical Care If you are pregnant and have any of these signs, seek care immediately:  Difficulty breathing or shortness of breath  Pain or pressure in the chest or abdomen  Sudden dizziness  Confusion  Severe or persistent vomiting  High fever that is not responding to Tylenol (or store brand equivalent)  Decreased or no movement of your baby  BombUnit.chHttps://www.cdc.gov/flu/protect/vaccine/pregnant.htm  [employer] L [place]kill, I greatly value your feedback.  If you receive a survey following your visit with us today, we appreciate you taking the time to fill it out.  Thanks, Joellyn HaffKim Georgenia Salim, CNM, WHNP-BC  [facility] ([address] [place], [place] [address]) Entrance C, located off of [address] Free 24/7 valet parking  Go to [email or web address] to register for FREE online childbirth classes  [place] Pediatricians/Family Doctors:  [facility] [phone]            [facility] [phone]                 [facility] [phone] (usually not accepting  new patients unless you have family there already, you are always welcome to call and ask)       [facility] Department [phone]       [facility]  Pediatricians/Family Doctors:   [facility]: [phone]  [facility]: [phone]  [facility]: [phone]  [facility] Doctors:   [facility]: [phone]   [facility]: [phone]  [facility] Doctors:  [facility]: [phone]    Home Blood Pressure Monitoring for Patients   Your provider has recommended that you check your blood pressure (BP) at least once a week at home. If you do not have a blood pressure cuff at home, one will be provided for you. Contact your provider if you have not received your monitor within 1 week.   Helpful Tips for Accurate Home Blood Pressure Checks  . Don't smoke, exercise, or drink caffeine 30 minutes before checking your BP . Use the restroom before checking your BP (a full bladder can raise your pressure) . Relax in a comfortable upright chair . Feet on the ground . Left arm resting comfortably on a flat surface at the level of your heart . Legs uncrossed . Back supported . Sit quietly and don't talk . Place the cuff on your bare arm . Adjust snuggly, so that only two fingertips can fit between your skin and the top of the cuff . Check 2 readings separated by at least one minute . Keep a log of your BP readings . For a visual, please reference this diagram: http://ccnc.care/bpdiagram  Provider Name: [facility]     Phone: [phone]  Zone 1: ALL CLEAR  Continue to monitor your symptoms:  . BP reading is less than 140 (top number) or less than 90 (bottom number)  . No right upper stomach pain . No headaches or seeing spots . No feeling nauseated or throwing up . No swelling in face and hands  Zone 2: CAUTION Call your doctor's office for any of the following:  . BP reading is greater than 140 (top number) or greater than 90 (bottom number)  . Stomach pain under your ribs in the middle or right side . Headaches or  seeing spots . Feeling nauseated or throwing up . Swelling in face and hands  Zone 3: EMERGENCY  Seek immediate medical care if you have any of the following:  . BP reading is greater than160 (top number) or greater than 110 (bottom number) . Severe headaches not improving with Tylenol . Serious difficulty catching your breath . Any worsening symptoms from Zone 2     Second Trimester of Pregnancy The second trimester is from week 14 through week 27 (months 4 through 6). The second trimester is often a time when you feel your best. Your body has adjusted to being pregnant, and you begin to feel better physically. Usually, morning sickness has lessened or quit completely, you may have more energy, and you may have an increase in appetite. The second trimester is also a time when the fetus is growing rapidly. At the end of the sixth month, the fetus is about 9 inches long and weighs about 1 pounds. You will likely begin to feel the baby move (quickening) between 16 and 20 weeks of pregnancy. Body changes during your second trimester Your body continues to go through many changes during your second trimester. The changes vary from woman to woman.  Your weight will continue to increase. You will  notice your lower abdomen bulging out.  You may begin to get stretch marks on your hips, abdomen, and breasts.  You may develop headaches that can be relieved by medicines. The medicines should be approved by your health care provider.  You may urinate more often because the fetus is pressing on your bladder.  You may develop or continue to have heartburn as a result of your pregnancy.  You may develop constipation because certain hormones are causing the muscles that push waste through your intestines to slow down.  You may develop hemorrhoids or swollen, bulging veins (varicose veins).  You may have back pain. This is caused by: ? Weight gain. ? Pregnancy hormones that are relaxing the joints  in your pelvis. ? A shift in weight and the muscles that support your balance.  Your breasts will continue to grow and they will continue to become tender.  Your gums may bleed and may be sensitive to brushing and flossing.  Dark spots or blotches (chloasma, mask of pregnancy) may develop on your face. This will likely fade after the baby is born.  A dark line from your belly button to the pubic area (linea nigra) may appear. This will likely fade after the baby is born.  You may have changes in your hair. These can include thickening of your hair, rapid growth, and changes in texture. Some women also have hair loss during or after pregnancy, or hair that feels dry or thin. Your hair will most likely return to normal after your baby is born.  What to expect at prenatal visits During a routine prenatal visit:  You will be weighed to make sure you and the fetus are growing normally.  Your blood pressure will be taken.  Your abdomen will be measured to track your baby's growth.  The fetal heartbeat will be listened to.  Any test results from the previous visit will be discussed.  Your health care provider may ask you:  How you are feeling.  If you are feeling the baby move.  If you have had any abnormal symptoms, such as leaking fluid, bleeding, severe headaches, or abdominal cramping.  If you are using any tobacco products, including cigarettes, chewing tobacco, and electronic cigarettes.  If you have any questions.  Other tests that may be performed during your second trimester include:  Blood tests that check for: ? Low iron levels (anemia). ? High blood sugar that affects pregnant women (gestational diabetes) between 64 and 28 weeks. ? Rh antibodies. This is to check for a protein on red blood cells (Rh factor).  Urine tests to check for infections, diabetes, or protein in the urine.  An ultrasound to confirm the proper growth and development of the baby.  An  amniocentesis to check for possible genetic problems.  Fetal screens for spina bifida and Down syndrome.  HIV (human immunodeficiency virus) testing. Routine prenatal testing includes screening for HIV, unless you choose not to have this test.  Follow these instructions at home: Medicines  Follow your health care provider's instructions regarding medicine use. Specific medicines may be either safe or unsafe to take during pregnancy.  Take a prenatal vitamin that contains at least 600 micrograms (mcg) of folic acid.  If you develop constipation, try taking a stool softener if your health care provider approves. Eating and drinking  Eat a balanced diet that includes fresh fruits and vegetables, whole grains, good sources of protein such as meat, eggs, or tofu, and low-fat dairy. Your health  care provider will help you determine the amount of weight gain that is right for you.  Avoid raw meat and uncooked cheese. These carry germs that can cause birth defects in the baby.  If you have low calcium intake from food, talk to your health care provider about whether you should take a daily calcium supplement.  Limit foods that are high in fat and processed sugars, such as fried and sweet foods.  To prevent constipation: ? Drink enough fluid to keep your urine clear or pale yellow. ? Eat foods that are high in fiber, such as fresh fruits and vegetables, whole grains, and beans. Activity  Exercise only as directed by your health care provider. Most women can continue their usual exercise routine during pregnancy. Try to exercise for 30 minutes at least 5 days a week. Stop exercising if you experience uterine contractions.  Avoid heavy lifting, wear low heel shoes, and practice good posture.  A sexual relationship may be continued unless your health care provider directs you otherwise. Relieving pain and discomfort  Wear a good support bra to prevent discomfort from breast  tenderness.  Take warm sitz baths to soothe any pain or discomfort caused by hemorrhoids. Use hemorrhoid cream if your health care provider approves.  Rest with your legs elevated if you have leg cramps or low back pain.  If you develop varicose veins, wear support hose. Elevate your feet for 15 minutes, 3-4 times a day. Limit salt in your diet. Prenatal Care  Write down your questions. Take them to your prenatal visits.  Keep all your prenatal visits as told by your health care provider. This is important. Safety  Wear your seat belt at all times when driving.  Make a list of emergency phone numbers, including numbers for family, friends, the hospital, and police and fire departments. General instructions  Ask your health care provider for a referral to a local prenatal education class. Begin classes no later than the beginning of month 6 of your pregnancy.  Ask for help if you have counseling or nutritional needs during pregnancy. Your health care provider can offer advice or refer you to specialists for help with various needs.  Do not use hot tubs, steam rooms, or saunas.  Do not douche or use tampons or scented sanitary pads.  Do not cross your legs for long periods of time.  Avoid cat litter boxes and soil used by cats. These carry germs that can cause birth defects in the baby and possibly loss of the fetus by miscarriage or stillbirth.  Avoid all smoking, herbs, alcohol, and unprescribed drugs. Chemicals in these products can affect the formation and growth of the baby.  Do not use any products that contain nicotine or tobacco, such as cigarettes and e-cigarettes. If you need help quitting, ask your health care provider.  Visit your dentist if you have not gone yet during your pregnancy. Use a soft toothbrush to brush your teeth and be gentle when you floss. Contact a health care provider if:  You have dizziness.  You have mild pelvic cramps, pelvic pressure, or  nagging pain in the abdominal area.  You have persistent nausea, vomiting, or diarrhea.  You have a bad smelling vaginal discharge.  You have pain when you urinate. Get help right away if:  You have a fever.  You are leaking fluid from your vagina.  You have spotting or bleeding from your vagina.  You have severe abdominal cramping or pain.  You have  rapid weight gain or weight loss.  You have shortness of breath with chest pain.  You notice sudden or extreme swelling of your face, hands, ankles, feet, or legs.  You have not felt your baby move in over an hour.  You have severe headaches that do not go away when you take medicine.  You have vision changes. Summary  The second trimester is from week 14 through week 27 (months 4 through 6). It is also a time when the fetus is growing rapidly.  Your body goes through many changes during pregnancy. The changes vary from woman to woman.  Avoid all smoking, herbs, alcohol, and unprescribed drugs. These chemicals affect the formation and growth your baby.  Do not use any tobacco products, such as cigarettes, chewing tobacco, and e-cigarettes. If you need help quitting, ask your health care provider.  Contact your health care provider if you have any questions. Keep all prenatal visits as told by your health care provider. This is important. This information is not intended to replace advice given to you by your health care provider. Make sure you discuss any questions you have with your health care provider. Document Released: 03/30/2001 Document Revised: 09/11/2015 Document Reviewed: 06/06/2012 Elsevier Interactive Patient Education  2017 [place] FLU! Because you are pregnant, we at [facility], along with the Centers for Disease Control (CDC), recommend that you receive the flu vaccine to protect yourself and your baby from the flu. The flu is more likely to cause severe illness in  pregnant women than in women of reproductive age who are not pregnant. Changes in the immune system, heart, and lungs during pregnancy make pregnant women (and women up to two weeks postpartum) more prone to severe illness from flu, including illness resulting in hospitalization. Flu also may be harmful for a pregnant woman's developing baby. A common flu symptom is fever, which may be associated with neural tube defects and other adverse outcomes for a developing baby. Getting vaccinated can also help protect a baby after birth from flu. (Mom passes antibodies onto the developing baby during her pregnancy.)  A Flu Vaccine is the Best Protection Against Flu Getting a flu vaccine is the first and most important step in protecting against flu. Pregnant women should get a flu shot and not the live attenuated influenza vaccine (LAIV), also known as nasal spray flu vaccine. Flu vaccines given during pregnancy help protect both the mother and her baby from flu. Vaccination has been shown to reduce the risk of flu-associated acute respiratory infection in pregnant women by up to one-half. A 2018 study showed that getting a flu shot reduced a pregnant woman's risk of being hospitalized with flu by an average of 40 percent. Pregnant women who get a flu vaccine are also helping to protect their babies from flu illness for the first several months after their birth, when they are too young to get vaccinated.   A Long Record of Safety for Flu Shots in Pregnant Women Flu shots have been given to millions of pregnant women over many years with a good safety record. There is a lot of evidence that flu vaccines can be given safely during pregnancy; though these data are limited for the first trimester. The CDC recommends that pregnant women get vaccinated during any trimester of their pregnancy. It is very important for pregnant women to get the flu shot.   Other Preventive Actions In addition to getting a flu  shot,  pregnant women should take the same everyday preventive actions the CDC recommends of everyone, including covering coughs, washing hands often, and avoiding people who are sick.  Symptoms and Treatment If you get sick with flu symptoms call your doctor right away. There are antiviral drugs that can treat flu illness and prevent serious flu complications. The CDC recommends prompt treatment for people who have influenza infection or suspected influenza infection and who are at high risk of serious flu complications, such as people with asthma, diabetes (including gestational diabetes), or heart disease. Early treatment of influenza in hospitalized pregnant women has been shown to reduce the length of the hospital stay.  Symptoms Flu symptoms include fever, cough, sore throat, runny or stuffy nose, body aches, headache, chills and fatigue. Some people may also have vomiting and diarrhea. People may be infected with the flu and have respiratory symptoms without a fever.  Early Treatment is Important for Pregnant Women Treatment should begin as soon as possible because antiviral drugs work best when started early (within 48 hours after symptoms start). Antiviral drugs can make your flu illness milder and make you feel better faster. They may also prevent serious health problems that can result from flu illness. Oral oseltamivir (Tamiflu) is the preferred treatment for pregnant women because it has the most studies available to suggest that it is safe and beneficial. Antiviral drugs require a prescription from your provider. Having a fever caused by flu infection or other infections early in pregnancy may be linked to birth defects in a baby. In addition to taking antiviral drugs, pregnant women who get a fever should treat their fever with Tylenol (acetaminophen) and contact their provider immediately.  When to Seek Emergency Medical Care If you are pregnant and have any of these signs, seek care  immediately:  Difficulty breathing or shortness of breath  Pain or pressure in the chest or abdomen  Sudden dizziness  Confusion  Severe or persistent vomiting  High fever that is not responding to Tylenol (or store brand equivalent)  Decreased or no movement of your baby  [email or web address]

## 2019-08-01 ENCOUNTER — Inpatient Hospital Stay (HOSPITAL_COMMUNITY)
Admission: AD | Admit: 2019-08-01 | Discharge: 2019-08-03 | DRG: 807 | Disposition: A | Discharge status: Home / Self Care | Payer: Medicaid Other | Attending: Obstetrics and Gynecology | Admitting: Obstetrics and Gynecology

## 2019-08-01 ENCOUNTER — Inpatient Hospital Stay (HOSPITAL_BASED_OUTPATIENT_CLINIC_OR_DEPARTMENT_OTHER): Payer: Medicaid Other

## 2019-08-01 ENCOUNTER — Encounter (HOSPITAL_COMMUNITY): Payer: Self-pay | Admitting: Obstetrics and Gynecology

## 2019-08-01 DIAGNOSIS — O99334 Smoking (tobacco) complicating childbirth: Secondary | ICD-10-CM | POA: Diagnosis present

## 2019-08-01 DIAGNOSIS — O364XX0 Maternal care for intrauterine death, not applicable or unspecified: Secondary | ICD-10-CM | POA: Diagnosis not present

## 2019-08-01 DIAGNOSIS — Z20822 Contact with and (suspected) exposure to covid-19: Secondary | ICD-10-CM | POA: Diagnosis present

## 2019-08-01 DIAGNOSIS — O360120 Maternal care for anti-D [Rh] antibodies, second trimester, not applicable or unspecified: Secondary | ICD-10-CM

## 2019-08-01 DIAGNOSIS — Z6841 Body Mass Index (BMI) 40.0 and over, adult: Secondary | ICD-10-CM

## 2019-08-01 DIAGNOSIS — O368320 Maternal care for abnormalities of the fetal heart rate or rhythm, second trimester, not applicable or unspecified: Secondary | ICD-10-CM

## 2019-08-01 DIAGNOSIS — Z6791 Unspecified blood type, Rh negative: Secondary | ICD-10-CM | POA: Diagnosis not present

## 2019-08-01 DIAGNOSIS — O368390 Maternal care for abnormalities of the fetal heart rate or rhythm, unspecified trimester, not applicable or unspecified: Secondary | ICD-10-CM

## 2019-08-01 DIAGNOSIS — F17210 Nicotine dependence, cigarettes, uncomplicated: Secondary | ICD-10-CM | POA: Diagnosis present

## 2019-08-01 DIAGNOSIS — Z98891 History of uterine scar from previous surgery: Secondary | ICD-10-CM

## 2019-08-01 DIAGNOSIS — Z3A18 18 weeks gestation of pregnancy: Secondary | ICD-10-CM

## 2019-08-01 DIAGNOSIS — O99332 Smoking (tobacco) complicating pregnancy, second trimester: Secondary | ICD-10-CM

## 2019-08-01 DIAGNOSIS — O021 Missed abortion: Principal | ICD-10-CM | POA: Diagnosis present

## 2019-08-01 LAB — TYPE AND SCREEN
ABO/RH(D): A NEG
Antibody Screen: NEGATIVE

## 2019-08-01 LAB — SARS CORONAVIRUS 2 (TAT 6-24 HRS): SARS Coronavirus 2: NEGATIVE

## 2019-08-01 LAB — COMPREHENSIVE METABOLIC PANEL
ALT: 20 U/L (ref 0–44)
AST: 19 U/L (ref 15–41)
Albumin: 2.5 g/dL — ABNORMAL LOW (ref 3.5–5.0)
Alkaline Phosphatase: 67 U/L (ref 38–126)
Anion gap: 9 (ref 5–15)
BUN: 7 mg/dL (ref 6–20)
CO2: 23 mmol/L (ref 22–32)
Calcium: 9.3 mg/dL (ref 8.9–10.3)
Chloride: 104 mmol/L (ref 98–111)
Creatinine, Ser: 0.5 mg/dL (ref 0.44–1.00)
GFR calc Af Amer: >60 mL/min (ref >60)
GFR calc non Af Amer: >60 mL/min (ref >60)
Glucose, Bld: 108 mg/dL — ABNORMAL HIGH (ref 70–99)
Potassium: 3.2 mmol/L — ABNORMAL LOW (ref 3.5–5.1)
Sodium: 136 mmol/L (ref 135–145)
Total Bilirubin: 0.4 mg/dL (ref 0.3–1.2)
Total Protein: 6.2 g/dL — ABNORMAL LOW (ref 6.5–8.1)

## 2019-08-01 LAB — CBC
HCT: 39.7 % (ref 36.0–46.0)
Hemoglobin: 12.8 g/dL (ref 12.0–15.0)
MCH: 27.4 pg (ref 26.0–34.0)
MCHC: 32.2 g/dL (ref 30.0–36.0)
MCV: 84.8 fL (ref 80.0–100.0)
Platelets: 329 10*3/uL (ref 150–400)
RBC: 4.68 MIL/uL (ref 3.87–5.11)
RDW: 14.7 % (ref 11.5–15.5)
WBC: 11.4 10*3/uL — ABNORMAL HIGH (ref 4.0–10.5)
nRBC: 0 % (ref 0.0–0.2)

## 2019-08-01 LAB — PROTIME-INR
INR: 1 (ref 0.8–1.2)
Prothrombin Time: 13.2 seconds (ref 11.4–15.2)

## 2019-08-01 LAB — RAPID URINE DRUG SCREEN, HOSP PERFORMED
Amphetamines: NOT DETECTED
Barbiturates: NOT DETECTED
Benzodiazepines: NOT DETECTED
Cocaine: NOT DETECTED
Opiates: NOT DETECTED
Tetrahydrocannabinol: NOT DETECTED

## 2019-08-01 LAB — ABO/RH: ABO/RH(D): A NEG

## 2019-08-01 LAB — APTT: aPTT: 28 seconds (ref 24–36)

## 2019-08-01 IMAGING — US US MFM OB LIMITED
1 series · 12 of 12 positions shown · non-contrast
Comparison: none

[Series 1: us mfm ob limited · 12 of 12 slices shown]
[im 1/12]
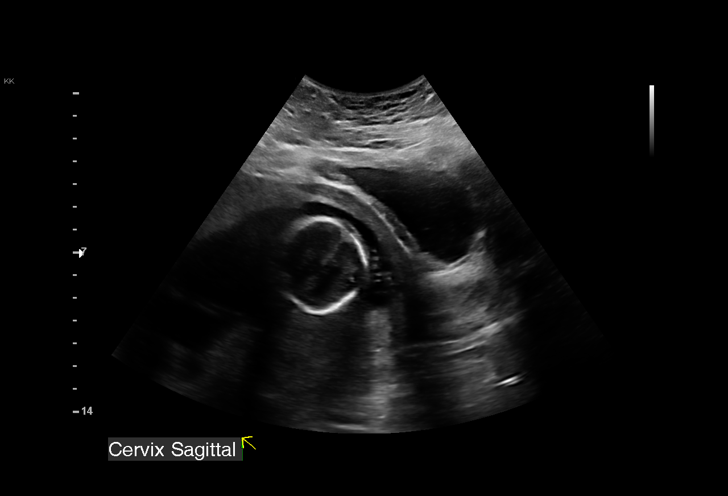
[im 2/12]
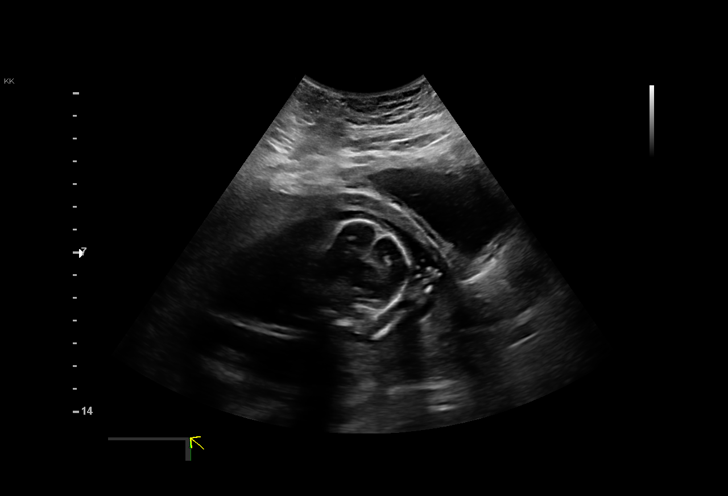
[im 3/12]
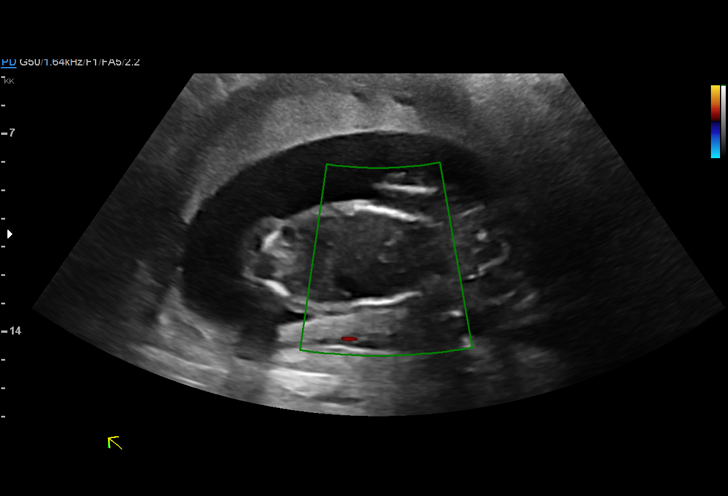
[im 4/12]
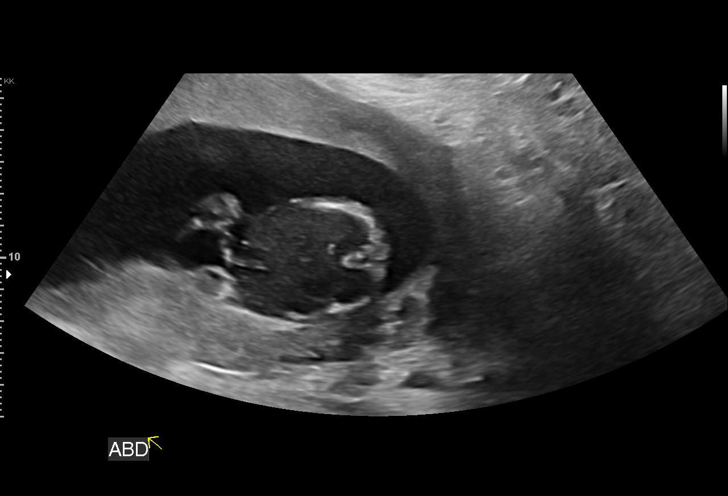
[im 5/12]
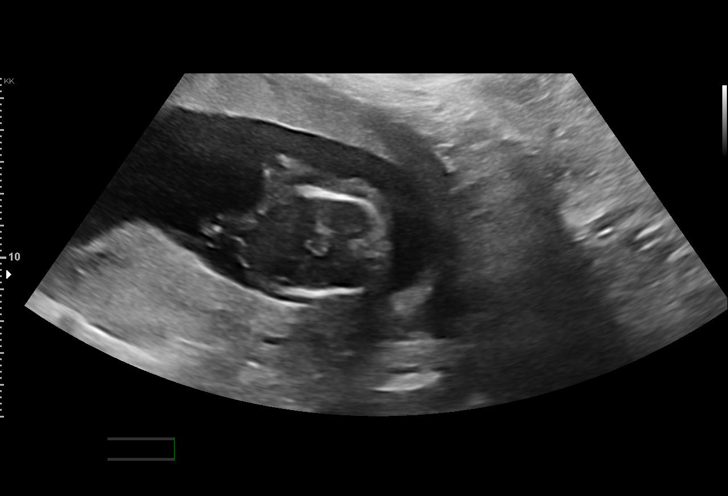
[im 6/12]
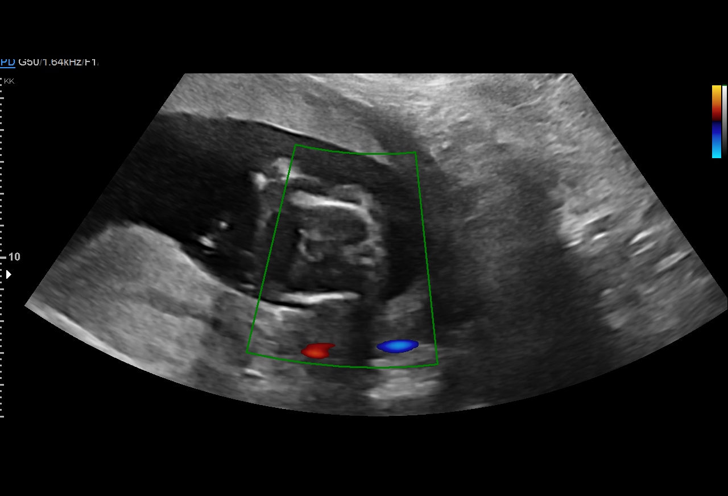
[im 7/12]
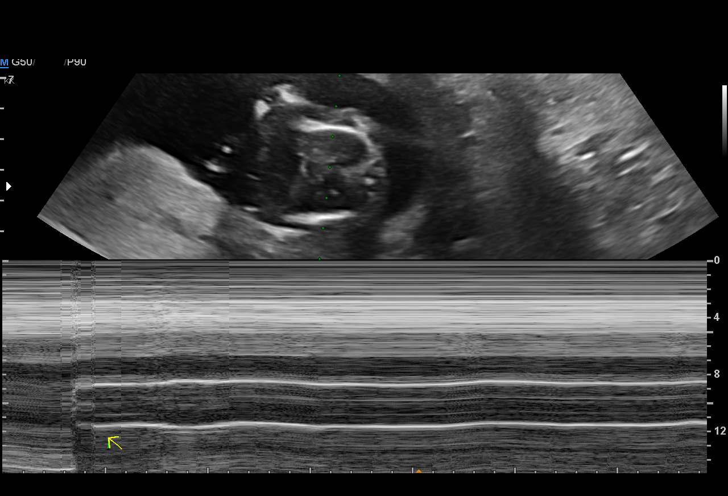
[im 8/12]
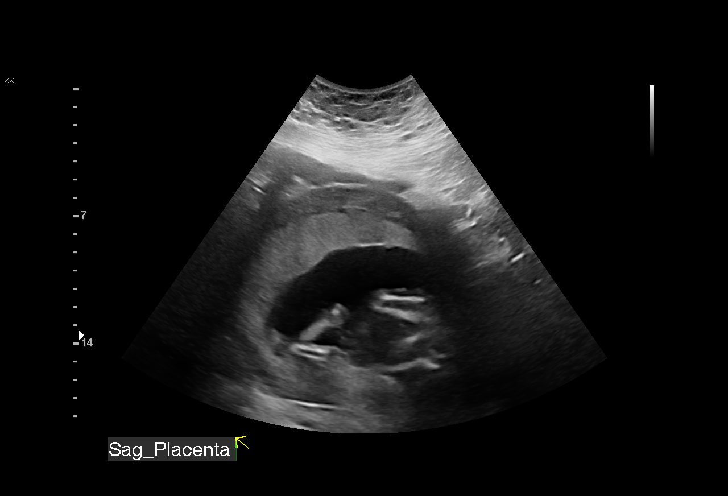
[im 9/12]
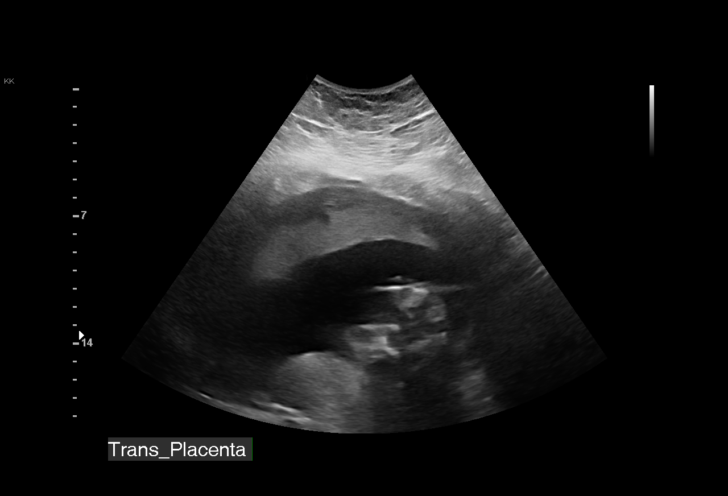
[im 10/12]
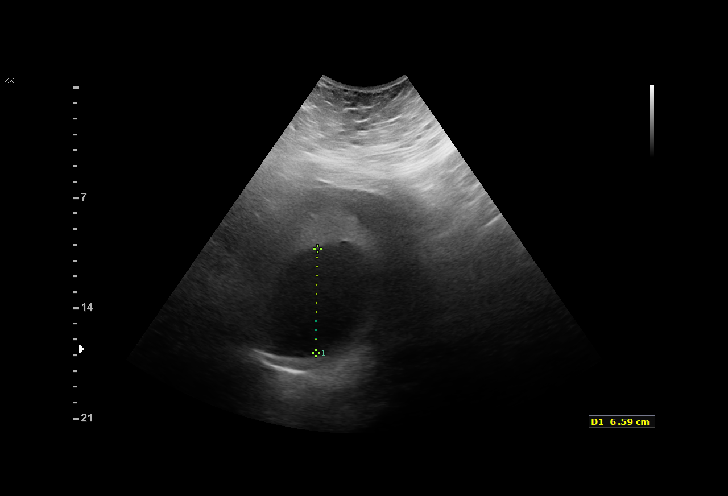
[im 11/12]
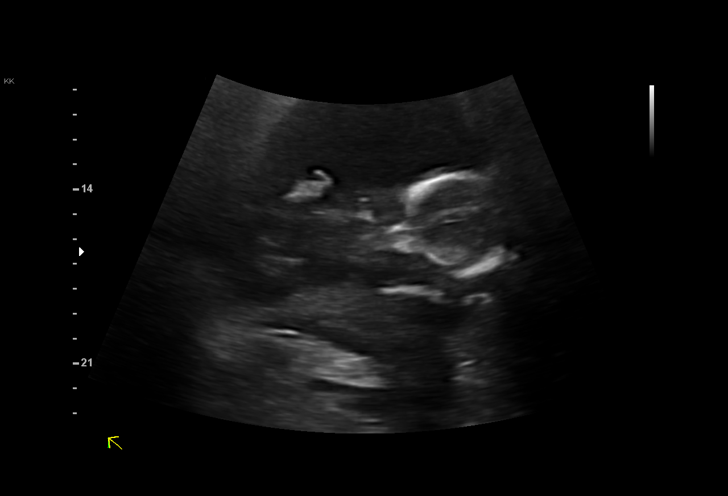
[im 12/12]
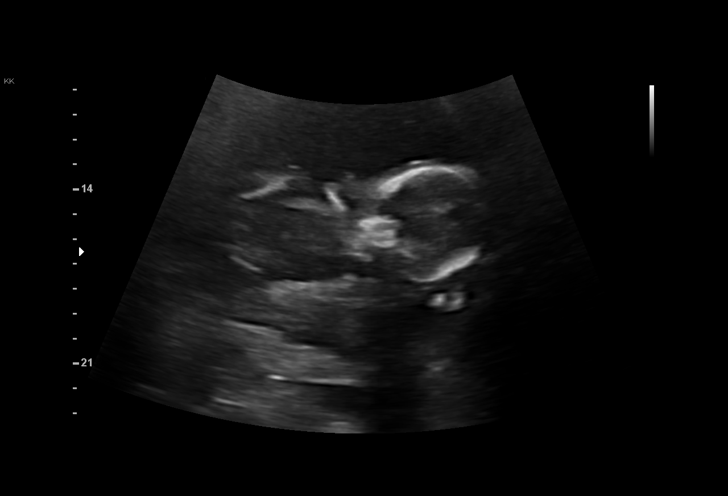

[12 of 12 positions shown; findings below may reference images not displayed]

ZARGUL NP
                   Care                                     [HOSPITAL]

  1  US MFM OB LIMITED                    76815.01     ZARGUL
 ----------------------------------------------------------------------

 ----------------------------------------------------------------------
Indications

  Unable to hear fetal heart tones as reason     O76
  for ultrasound
  Encounter for assessment of fetal demise       [PF]
  Maternal morbid obesity                        [PF] [PF]
  Tobacco use complicating pregnancy,            [PF]
  second trimester
  Rh negative state in antepartum                [PF]
  18 weeks gestation of pregnancy
  MaterniT 21 Negative
 ----------------------------------------------------------------------
Vital Signs

 BMI:
Fetal Evaluation

 Num Of Fetuses:         1
 Cardiac Activity:       Absent
 Presentation:           Cephalic
 Placenta:               Anterior

 Amniotic Fluid
 AFI FV:      Within normal limits

                             Largest Pocket(cm)

OB History

 Gravidity:    5         Term:   3        Prem:   0        SAB:   1
 TOP:          0       Ectopic:  0        Living: 3
Gestational Age

 Best:          18w 6d     Det. By:  Early Ultrasound         EDD:   [DATE]
                                     ([DATE])
Cervix Uterus Adnexa

 Cervix
 Normal appearance by transabdominal scan.
Comments

 A fetal demise was confirmed via today's ultrasound exam.  A
 fetal heartbeat was not present today.

 There was normal amniotic fluid noted.

## 2019-08-01 MED ORDER — LACTATED RINGERS IV SOLN: INTRAVENOUS | Status: DC

## 2019-08-01 MED ORDER — PRENATAL MULTIVITAMIN CH: 1.0000 | ORAL_TABLET | Freq: Every day | ORAL | Status: DC

## 2019-08-01 MED ORDER — DOCUSATE SODIUM 100 MG PO CAPS: 100.0000 mg | ORAL_CAPSULE | Freq: Every day | ORAL | Status: DC

## 2019-08-01 MED ORDER — CALCIUM CARBONATE ANTACID 500 MG PO CHEW: 2.0000 | CHEWABLE_TABLET | ORAL | Status: DC | PRN

## 2019-08-01 MED ORDER — FENTANYL CITRATE (PF) 100 MCG/2ML IJ SOLN
50.0000 ug | INTRAMUSCULAR | Status: DC | PRN
  Administered 2019-08-01: 50 ug via INTRAVENOUS
  Administered 2019-08-01 – 2019-08-02 (×4): 100 ug via INTRAVENOUS
  Filled 2019-08-01 (×5): qty 2

## 2019-08-01 MED ORDER — ONDANSETRON HCL 4 MG/2ML IJ SOLN
4.0000 mg | Freq: Four times a day (QID) | INTRAMUSCULAR | Status: DC | PRN
  Administered 2019-08-02 (×2): 4 mg via INTRAVENOUS
  Filled 2019-08-01 (×3): qty 2

## 2019-08-01 MED ORDER — ZOLPIDEM TARTRATE 5 MG PO TABS: 5.0000 mg | ORAL_TABLET | Freq: Every evening | ORAL | Status: DC | PRN

## 2019-08-01 MED ORDER — MISOPROSTOL 200 MCG PO TABS
400.0000 ug | ORAL_TABLET | ORAL | Status: AC
  Administered 2019-08-01 – 2019-08-02 (×5): 400 ug via VAGINAL
  Filled 2019-08-01 (×5): qty 2

## 2019-08-01 MED ORDER — ACETAMINOPHEN 325 MG PO TABS
650.0000 mg | ORAL_TABLET | ORAL | Status: DC | PRN
  Administered 2019-08-02 (×3): 650 mg via ORAL
  Filled 2019-08-01 (×3): qty 2

## 2019-08-01 MED ORDER — MISOPROSTOL 200 MCG PO TABS
200.0000 ug | ORAL_TABLET | Freq: Once | ORAL | Status: AC
  Administered 2019-08-01: 200 ug via ORAL
  Filled 2019-08-01: qty 1

## 2019-08-01 MED ORDER — OXYTOCIN 40 UNITS IN NORMAL SALINE INFUSION - SIMPLE MED
INTRAVENOUS | Status: AC
  Filled 2019-08-01: qty 1000

## 2019-08-01 MED ORDER — HYDROMORPHONE HCL 1 MG/ML IJ SOLN
1.0000 mg | INTRAMUSCULAR | Status: DC | PRN
  Administered 2019-08-02 (×2): 2 mg via INTRAVENOUS
  Filled 2019-08-01 (×2): qty 2

## 2019-08-01 NOTE — Progress Notes (Signed)
INitial visit with Kiyoko to introduce spiritual care and offer support as she begins IOL to deliver her baby, Vicente Serene, who she found out yesterday no longer had a heartbeat. Offered ministry of presence as Hospital doctor shared the story of how she found out about Gabriel's death, her journey with his father, who is no longer in the picture.  She chose the name Vicente Serene because it means, "God is my strength" and shared that her faith is an inspiration and a guide for her, but that that is not always evident in how she lives her life.  I helped her to engage in some life review and facilitated in processing some complicated family dynamics in addition to beginning the exploration of logistics related to the disposition plan for Gabriel's body.  I spent approximately an hour with the patient and concluded the visit with prayer at her request.  At present, she is choosing to labor alone.  I affirmed that sometimes no support is better than the wrong kind of support.  She is aware of how to reach a chaplain during the evening if she needs a visit before follow up occurs tomorrow.  Please page as further needs arise.  Maryanna Shape. Carley Hammed, M.Div. [facility] Chaplain Pager [phone] Office [phone]        08/01/19 1647  Clinical Encounter Type  Visited With Patient  Visit Type Initial;Spiritual support;Death  Referral From Nurse  Spiritual Encounters  Spiritual Needs Prayer;Emotional;Grief support  Stress Factors  Patient Stress Factors Loss

## 2019-08-01 NOTE — MAU Note (Signed)
covid swab collected. PT tolerated well. Asymptomatic

## 2019-08-01 NOTE — H&P (Signed)
[name] is a 34 y.o. [phone]  female presenting for medical IOL d/t FDIU at 28 6/7 weeks. Seen yesterday at [facility] for routine OB appt. FDIU noted and confirmed by U/S. Presents today for IOL.  Prenatal care at [facility] unremarkable to this point.  H/O c section x 3. H/O Bladder reconstruction  OB History    Gravida  5   Para  3   Term  3   Preterm      AB  1   Living  3     SAB      TAB      Ectopic      Multiple      Live Births  3          Past Medical History:  Diagnosis Date  . Kidney infection   . UTI (urinary tract infection)   . Vaginal Pap smear, abnormal    Past Surgical History:  Procedure Laterality Date  . BLADDER SURGERY    . CESAREAN SECTION    . COLPOSCOPY     Family History: family history includes Arthritis in her maternal grandmother; Asthma in her maternal grandmother; Cancer in her maternal grandfather, maternal grandmother, and another family member; Diabetes in her maternal grandfather and another family member; Thyroid disease in her maternal aunt and mother. Social History:  reports that she has been smoking cigarettes. She has a 0.50 pack-year smoking history. She has never used smokeless tobacco. She reports that she does not drink alcohol or use drugs.     Maternal Diabetes: No Genetic Screening: Normal Maternal Ultrasounds/Referrals: Other: Fetal Ultrasounds or other Referrals:  None Maternal Substance Abuse:  No Significant Maternal Medications:  None Significant Maternal Lab Results:  None Other Comments:  None  Review of Systems  Constitutional: Negative.   Respiratory: Negative.   Cardiovascular: Negative.   Gastrointestinal: Negative.   Genitourinary: Negative.    History   There were no vitals taken for this visit. Exam Physical Exam  Constitutional: She appears well-developed and well-nourished.  Cardiovascular: Normal rate and regular rhythm.  Respiratory: Effort normal and breath sounds normal.  GI: Soft.  Bowel sounds are normal.  Obese, FH < 20 weeks  Genitourinary:    Genitourinary Comments: Deferred     Prenatal labs: ABO, Rh: A/Negative/-- (03/03 1523) Antibody: Negative (03/03 1523) Rubella: 1.21 (03/03 1523) RPR: Non Reactive (03/03 1523)  HBsAg: Negative (03/03 1523)  HIV: Non Reactive (03/03 1523)  GBS:     Assessment/Plan: FDIU 18 6/7 weeks H/O C section x 3  Rh negative  Medical induction of labor reviewed with pt. Risks of uterine ruptured reviewed with pt. Pain management discussed. Potential need for D & C for retained placenta discussed. Pt verbalized understanding of POC and desires to proceed.     Hermina Staggers 08/01/2019, 2:40 PM

## 2019-08-01 NOTE — Progress Notes (Signed)
Daily Antepartum Note  Admission Date: 08/10/2019 Current Date: 2019/08/10 5:52 PM  [name] is a 34 y.o. [id number] @ [redacted]w[redacted]d, HD#1, admitted for IUFD dx on 4/13 in the office  Pregnancy complicated by: Patient Active Problem List   Diagnosis Date Noted  . Fetal demise before 20 weeks with retention of dead fetus 08/10/19  . History of cesarean delivery 08-10-2019  . BMI 50.0-59.9, adult (HCC) 2019-08-10  . IUFD at less than 20 weeks of gestation 07/31/2019  . Asymptomatic bacteriuria during pregnancy in first trimester 06/25/2019  . Marijuana use 06/21/2019  . Rh negative state in antepartum period 06/21/2019  . Encounter for supervision of other normal pregnancy, unspecified trimester 06/20/2019  . Smoker 06/20/2019  . Abnormal Pap smear of cervix 06/20/2019  . Depression with anxiety 06/20/2019    Overnight/24hr events:  none  Subjective:  No issues.   Objective:  A few transient mild range BPs Currently AF VS normal and stable  Physical exam: General: Well nourished, well developed female in no acute distress. Skin: Warm and dry.   Medications: Current Facility-Administered Medications  Medication Dose Route Frequency Provider Last Rate Last Admin  . acetaminophen (TYLENOL) tablet 650 mg  650 mg Oral Q4H PRN Rasch, Victorino Dike I, NP      . fentaNYL (SUBLIMAZE) injection 50-100 mcg  50-100 mcg Intravenous Q1H PRN Hermina Staggers, MD      . HYDROmorphone (DILAUDID) injection 1-2 mg  1-2 mg Intravenous Q3H PRN Hermina Staggers, MD      . lactated ringers infusion   Intravenous Continuous Navarro Bing, MD      . misoprostol (CYTOTEC) tablet 400 mcg  400 mcg Vaginal Q4H Hermina Staggers, MD   400 mcg at Aug 10, 2019 1530  . ondansetron (ZOFRAN) injection 4 mg  4 mg Intravenous Q6H PRN Anvik Bing, MD      . oxytocin 40 units in NS 1000 mL 40 units/1000 mL infusion             Labs:  Recent Labs  Lab 08/10/2019 1610  WBC 11.4*  HGB 12.8  HCT 39.7  PLT 329    Conflict (See Lab Report): A NEG/A NEG Performed at [facility], [address], [place], [place] [address]   Radiology:  IUFD dx via u/s on admission, cephalic, normal placenta, normal AFI  Assessment & Plan:  Pt stable *IUFD: dx and she desires anora testing, pt considering autopsy; I d/w her re: expectations that usually testing is normal. She does to desire to see and hold the baby immediately after delivery. UDS ordered and can order additional blood work post delivery *IOL: d/w her small risk of uterine rupture and about a 25% chance for needing a d&c post delivery. I also d/w her re: expectations for the labor process. cotinue with cytotec 200 q4h PV.  -pt s/p cytotec #1 @ 1530. *FEN/GI: laboring diet   Cornelia Copa MD Attending Center for [facility])

## 2019-08-02 LAB — URINE CULTURE

## 2019-08-02 LAB — SPECIMEN STATUS REPORT

## 2019-08-02 MED ORDER — ACETAMINOPHEN 325 MG PO TABS
650.0000 mg | ORAL_TABLET | ORAL | Status: DC | PRN
  Administered 2019-08-03: 650 mg via ORAL
  Filled 2019-08-02: qty 2

## 2019-08-02 MED ORDER — COCONUT OIL OIL: 1.0000 "application " | TOPICAL_OIL | Status: DC | PRN

## 2019-08-02 MED ORDER — SENNOSIDES-DOCUSATE SODIUM 8.6-50 MG PO TABS
2.0000 | ORAL_TABLET | ORAL | Status: DC
  Administered 2019-08-02: 2 via ORAL
  Filled 2019-08-02: qty 2

## 2019-08-02 MED ORDER — SODIUM CHLORIDE 0.9 % IV SOLN: INTRAVENOUS | Status: DC

## 2019-08-02 MED ORDER — BENZOCAINE-MENTHOL 20-0.5 % EX AERO: 1.0000 "application " | INHALATION_SPRAY | CUTANEOUS | Status: DC | PRN

## 2019-08-02 MED ORDER — ONDANSETRON HCL 4 MG PO TABS
4.0000 mg | ORAL_TABLET | ORAL | Status: DC | PRN
  Administered 2019-08-03: 4 mg via ORAL
  Filled 2019-08-02: qty 1

## 2019-08-02 MED ORDER — RHO D IMMUNE GLOBULIN 1500 UNIT/2ML IJ SOSY
300.0000 ug | PREFILLED_SYRINGE | Freq: Once | INTRAMUSCULAR | Status: AC
  Administered 2019-08-03: 300 ug via INTRAVENOUS
  Filled 2019-08-02: qty 2

## 2019-08-02 MED ORDER — TETANUS-DIPHTH-ACELL PERTUSSIS 5-2.5-18.5 LF-MCG/0.5 IM SUSP
0.5000 mL | Freq: Once | INTRAMUSCULAR | Status: AC
  Administered 2019-08-03: 0.5 mL via INTRAMUSCULAR
  Filled 2019-08-02: qty 0.5

## 2019-08-02 MED ORDER — ZOLPIDEM TARTRATE 5 MG PO TABS: 5.0000 mg | ORAL_TABLET | Freq: Every evening | ORAL | Status: DC | PRN

## 2019-08-02 MED ORDER — MISOPROSTOL 200 MCG PO TABS
400.0000 ug | ORAL_TABLET | ORAL | Status: DC
  Administered 2019-08-02: 400 ug via VAGINAL
  Filled 2019-08-02: qty 2

## 2019-08-02 MED ORDER — FLEET ENEMA 7-19 GM/118ML RE ENEM: 1.0000 | ENEMA | Freq: Every day | RECTAL | Status: DC | PRN

## 2019-08-02 MED ORDER — DIBUCAINE (PERIANAL) 1 % EX OINT: 1.0000 "application " | TOPICAL_OINTMENT | CUTANEOUS | Status: DC | PRN

## 2019-08-02 MED ORDER — OXYCODONE HCL 5 MG PO TABS: 10.0000 mg | ORAL_TABLET | ORAL | Status: DC | PRN

## 2019-08-02 MED ORDER — PRENATAL MULTIVITAMIN CH
1.0000 | ORAL_TABLET | Freq: Every day | ORAL | Status: DC
  Administered 2019-08-03: 1 via ORAL
  Filled 2019-08-02: qty 1

## 2019-08-02 MED ORDER — IBUPROFEN 600 MG PO TABS
600.0000 mg | ORAL_TABLET | Freq: Four times a day (QID) | ORAL | Status: DC
  Administered 2019-08-02 – 2019-08-03 (×3): 600 mg via ORAL
  Filled 2019-08-02 (×3): qty 1

## 2019-08-02 MED ORDER — ONDANSETRON HCL 4 MG/2ML IJ SOLN: 4.0000 mg | INTRAMUSCULAR | Status: DC | PRN

## 2019-08-02 MED ORDER — MISOPROSTOL 200 MCG PO TABS
400.0000 ug | ORAL_TABLET | Freq: Once | ORAL | Status: AC
  Administered 2019-08-02: 400 ug via BUCCAL
  Filled 2019-08-02: qty 2

## 2019-08-02 MED ORDER — WITCH HAZEL-GLYCERIN EX PADS: 1.0000 "application " | MEDICATED_PAD | CUTANEOUS | Status: DC | PRN

## 2019-08-02 MED ORDER — OXYCODONE HCL 5 MG PO TABS
5.0000 mg | ORAL_TABLET | ORAL | Status: DC | PRN
  Administered 2019-08-03 (×3): 5 mg via ORAL
  Filled 2019-08-02 (×3): qty 1

## 2019-08-02 MED ORDER — MISOPROSTOL 200 MCG PO TABS
200.0000 ug | ORAL_TABLET | Freq: Once | ORAL | Status: AC
  Administered 2019-08-02: 200 ug via BUCCAL
  Filled 2019-08-02: qty 1

## 2019-08-02 MED ORDER — SIMETHICONE 80 MG PO CHEW: 80.0000 mg | CHEWABLE_TABLET | ORAL | Status: DC | PRN

## 2019-08-02 MED ORDER — DIPHENHYDRAMINE HCL 25 MG PO CAPS: 25.0000 mg | ORAL_CAPSULE | Freq: Four times a day (QID) | ORAL | Status: DC | PRN

## 2019-08-02 MED ORDER — BISACODYL 10 MG RE SUPP: 10.0000 mg | Freq: Every day | RECTAL | Status: DC | PRN

## 2019-08-02 NOTE — Progress Notes (Signed)
[place] NOTE  [name] is a 34 y.o. [id number] at [redacted]w[redacted]d who is admitted for induction of labor due to IUFD..  Estimated Date of Delivery: 12/27/19 Fetal presentation is breech.  Length of Stay:  1 Days. Admitted 08/01/2019  Subjective: Pt feels occasional contractions, has had multiple doses of misoprostol and has had little change. Pt reassured that process takes some time.   Vitals:  Blood pressure 134/63, pulse 76, temperature 99 F (37.2 C), temperature source Oral, resp. rate 19, height 5\' 6"  (1.676 m), weight (!) 145.1 kg, SpO2 97 %. Physical Examination: CONSTITUTIONAL: Well-developed, well-nourished female in no acute distress.  HENT:  Normocephalic, atraumatic, External right and left ear normal. Oropharynx is clear and moist EYES: Conjunctivae and EOM are normal. Pupils are equal, round, and reactive to light. No scleral icterus.  NECK: Normal range of motion, supple, no masses SKIN: Skin is warm and dry. No rash noted. Not diaphoretic. No erythema. No pallor. [place]: Alert and oriented to person, place, and time. Normal reflexes, muscle tone coordination. No cranial nerve deficit noted. PSYCHIATRIC: Normal mood and affect. Normal behavior. Normal judgment and thought content. CARDIOVASCULAR: Normal heart rate noted, regular rhythm RESPIRATORY: Effort and breath sounds normal, no problems with respiration noted MUSCULOSKELETAL: Normal range of motion. No edema and no tenderness. 2+ distal pulses. ABDOMEN: Soft, nontender, nondistended, gravid. CERVIX: Dilation: 1 Effacement (%): 60 Exam by:: Dr. Jimmye Norman    Results for orders placed or performed during the hospital encounter of 08/01/19 (from the past 48 hour(s))  SARS CORONAVIRUS 2 (TAT 6-24 HRS) Nasopharyngeal Nasopharyngeal Swab     Status: None   Collection Time: 08/01/19  1:23 PM   Specimen: Nasopharyngeal Swab  Result Value Ref Range   SARS Coronavirus 2 NEGATIVE NEGATIVE     Comment: (NOTE) SARS-CoV-2 target nucleic acids are NOT DETECTED. The SARS-CoV-2 RNA is generally detectable in upper and lower respiratory specimens during the acute phase of infection. Negative results do not preclude SARS-CoV-2 infection, do not rule out co-infections with other pathogens, and should not be used as the sole basis for treatment or other patient management decisions. Negative results must be combined with clinical observations, patient history, and epidemiological information. The expected result is Negative. Fact Sheet for Patients: SugarRoll.be Fact Sheet for Healthcare Providers: https://www.woods-mathews.com/ This test is not yet approved or cleared by the [place] FDA and  has been authorized for detection and/or diagnosis of SARS-CoV-2 by FDA under an Emergency Use Authorization (EUA). This EUA will remain  in effect (meaning this test can be used) for the duration of the COVID-19 declaration under Section 56 4(b)(1) of the Act, 21 U.S.C. section 360bbb-3(b)(1), unless the authorization is terminated or revoked sooner. Performed at [facility], [place] [address], [place], [place] [address]   Type and screen [place]     Status: None   Collection Time: 08/01/19  3:15 PM  Result Value Ref Range   ABO/RH(D) A NEG    Antibody Screen NEG    Sample Expiration      08/04/2019,2359 Performed at [facility], [place] [address] [place], [place] [address]   ABO/Rh     Status: None   Collection Time: 08/01/19  3:15 PM  Result Value Ref Range   ABO/RH(D)      A NEG Performed at [place] [address], [place], [place] [address]   CBC     Status: Abnormal  Collection Time: 08/01/19  4:10 PM  Result Value Ref Range   WBC 11.4 (H) 4.0 - 10.5 K/uL   RBC 4.68 3.87 - 5.11 MIL/uL   Hemoglobin 12.8 12.0 - 15.0 g/dL   HCT 16.139.7 09.636.0 - 04.546.0 %   MCV 84.8 80.0 - 100.0 fL   MCH 27.4  26.0 - 34.0 pg   MCHC 32.2 30.0 - 36.0 g/dL   RDW 40.914.7 81.111.5 - 91.415.5 %   Platelets 329 150 - 400 K/uL   nRBC 0.0 0.0 - 0.2 %    Comment: Performed at [facility], [address] [place], [place] [address]  Urine rapid drug screen (hosp performed)     Status: None   Collection Time: 08/01/19  5:23 PM  Result Value Ref Range   Opiates NONE DETECTED NONE DETECTED   Cocaine NONE DETECTED NONE DETECTED   Benzodiazepines NONE DETECTED NONE DETECTED   Amphetamines NONE DETECTED NONE DETECTED   Tetrahydrocannabinol NONE DETECTED NONE DETECTED   Barbiturates NONE DETECTED NONE DETECTED    Comment: (NOTE) DRUG SCREEN FOR MEDICAL PURPOSES ONLY.  IF CONFIRMATION IS NEEDED FOR ANY PURPOSE, NOTIFY LAB WITHIN 5 DAYS. LOWEST DETECTABLE LIMITS FOR URINE DRUG SCREEN Drug Class                     Cutoff (ng/mL) Amphetamine and metabolites    1000 Barbiturate and metabolites    200 Benzodiazepine                 200 Tricyclics and metabolites     300 Opiates and metabolites        300 Cocaine and metabolites        300 THC                            50 Performed at [facility], [address] [place], [place] [address]   Comprehensive metabolic panel     Status: Abnormal   Collection Time: 08/01/19  8:11 PM  Result Value Ref Range   Sodium 136 135 - 145 mmol/L   Potassium 3.2 (L) 3.5 - 5.1 mmol/L   Chloride 104 98 - 111 mmol/L   CO2 23 22 - 32 mmol/L   Glucose, Bld 108 (H) 70 - 99 mg/dL    Comment: Glucose reference range applies only to samples taken after fasting for at least 8 hours.   BUN 7 6 - 20 mg/dL   Creatinine, Ser 8.650.50 0.44 - 1.00 mg/dL   Calcium 9.3 8.9 - 78.410.3 mg/dL   Total Protein 6.2 (L) 6.5 - 8.1 g/dL   Albumin 2.5 (L) 3.5 - 5.0 g/dL   AST 19 15 - 41 U/L   ALT 20 0 - 44 U/L   Alkaline Phosphatase 67 38 - 126 U/L   Total Bilirubin 0.4 0.3 - 1.2 mg/dL   GFR calc non Af Amer >60 >60 mL/min   GFR calc Af Amer >60 >60 mL/min   Anion gap 9 5 - 15    Comment:  Performed at [facility], [address] [place], [place] [address]  APTT     Status: None   Collection Time: 08/01/19  8:11 PM  Result Value Ref Range   aPTT 28 24 - 36 seconds    Comment: Performed at [facility], [address] [place], [place] [address]  Protime-INR     Status: None  Collection Time: 08/01/19  8:11 PM  Result Value Ref Range   Prothrombin Time 13.2 11.4 - 15.2 seconds   INR 1.0 0.8 - 1.2    Comment: (NOTE) INR goal varies based on device and disease states. Performed at [facility], [address], [place], [place] [address]     [place] MFM OB LIMITED  Result Date: 08/01/2019 ----------------------------------------------------------------------  OBSTETRICS REPORT                       (Signed Final 08/01/2019 03:49 pm) ---------------------------------------------------------------------- Patient Info  ID #:       [id number]                          D.O.B.:  02/03/1986 (33 yrs)  Name:       [name]                   Visit Date: 08/01/2019 02:06 pm ---------------------------------------------------------------------- Performed By  Performed By:     Emeline Darling BS,      Secondary Phy.:   Harolyn Rutherford                    RDMS                                                             [facility] NP  Attending:        Ma Rings MD         Address:          [address] [place],                                                             [place] [address]  Referred By:      [facility]       [facility] ---------------------------------------------------------------------- Orders   #  Description                          Code         Ordered By   1  [place] MFM OB LIMITED                    95188.41     JENNIFER  [facility]  ----------------------------------------------------------------------   #  Order #                     Accession #                 Episode #   1  [id number]                  [id number]                  [id number]  ---------------------------------------------------------------------- Indications   Unable to hear fetal heart tones as reason     O76   for ultrasound   Encounter for assessment of fetal demise       O76.89   Maternal morbid obesity                        O99.210 E66.01   Tobacco use complicating pregnancy,            O99.332   second trimester   Rh negative state in antepartum                O36.0190   [redacted] weeks gestation of pregnancy                Z3A.18   MaterniT 21 Negative  ---------------------------------------------------------------------- Vital Signs  Weight (lb): 319                               Height:        5'6"  BMI:         51.48 ---------------------------------------------------------------------- Fetal Evaluation  Num Of Fetuses:         1  Cardiac Activity:       Absent  Presentation:           Cephalic  Placenta:               Anterior  Amniotic Fluid  AFI FV:      Within normal limits                              Largest Pocket(cm)                              6.7 ---------------------------------------------------------------------- OB History  Gravidity:    5         Term:   3        Prem:   0        SAB:   1  TOP:          0       Ectopic:  0        Living: 3 ---------------------------------------------------------------------- Gestational Age  Best:          18w 6d     Det. By:  Marcella Dubs         EDD:   12/27/19                                      (05/16/19) ---------------------------------------------------------------------- Cervix Uterus Adnexa  Cervix  Normal appearance by transabdominal scan. ---------------------------------------------------------------------- Comments  A fetal demise was confirmed via today's ultrasound exam.  A  fetal heartbeat was not present today.  There was normal amniotic fluid noted.  ----------------------------------------------------------------------                   Ma Rings, MD Electronically Signed Final Report   08/01/2019 03:49 pm ----------------------------------------------------------------------   Current scheduled medications . [START ON 08/03/2019] ibuprofen  600 mg Oral Q6H  . misoprostol  400 mcg Vaginal Q4H  . [START ON 08/03/2019] prenatal multivitamin  1 tablet Oral Q1200  . rho (d) immune globulin  300 mcg Intravenous Once  . [START ON 08/03/2019] senna-docusate  2 tablet Oral Q24H  . [START ON 08/03/2019] Tdap  0.5 mL Intramuscular Once    I have reviewed the patient's current medications.  ASSESSMENT: Active Problems:   Fetal demise before 20 weeks with retention of dead fetus   History of cesarean delivery   BMI 50.0-59.9, adult (HCC)   PLAN: Will repeat loading dose and reassess q 4hours. Miso 400mg  buccal and 400mg  PV placed.  Once pain becomes severe plan for epidural.     MD, FACOG Obstetrician & Gynecologist, [facility] for [facility], [facility]

## 2019-08-02 NOTE — Progress Notes (Signed)
I checked in on [name] as a follow up to Chaplain Janetta Hora Lomax's visit yesterday.  She did not wish to speak at this time, but is open to [place] checking in on her later.  Chaplain Dyanne Carrel, Bcc Pager, [phone] 3:07 PM

## 2019-08-02 NOTE — Progress Notes (Signed)
Patient ID: [name], female   DOB: 1986-02-24, 34 y.o.   MRN: [id number] Pt feeling a few cramps, but not very painful. No bleeding  PE AF VSS SVE, closed, Cytotec tablets noted in vaginal vault, removed, crushed and replaced.  A/P FDIU at 19 weeks      Will check in on pt after noon today to evaluate progress. Auroa kit placed in room.

## 2019-08-02 NOTE — Progress Notes (Addendum)
RN to pt room, pt c/o of leaking and feeling something between her legs. RN called Dr. Mayford Knife at 2041 for him to come to delivery.  Delivery time 2052, Placenta 2132.  Pitocin hung and infusing at 125 mL/ hr- verbal order per Dr. Mayford Knife

## 2019-08-02 NOTE — Progress Notes (Signed)
Starting to cramp some. Still no vaginal bleeding. Will give buccal dose of Cytotec. Continue to monitor

## 2019-08-03 ENCOUNTER — Encounter (HOSPITAL_COMMUNITY): Payer: Self-pay

## 2019-08-03 LAB — CBC
HCT: 34 % — ABNORMAL LOW (ref 36.0–46.0)
Hemoglobin: 11.2 g/dL — ABNORMAL LOW (ref 12.0–15.0)
MCH: 27.5 pg (ref 26.0–34.0)
MCHC: 32.9 g/dL (ref 30.0–36.0)
MCV: 83.3 fL (ref 80.0–100.0)
Platelets: 249 10*3/uL (ref 150–400)
RBC: 4.08 MIL/uL (ref 3.87–5.11)
RDW: 14.6 % (ref 11.5–15.5)
WBC: 14.4 10*3/uL — ABNORMAL HIGH (ref 4.0–10.5)
nRBC: 0 % (ref 0.0–0.2)

## 2019-08-03 LAB — RPR: RPR Ser Ql: NONREACTIVE

## 2019-08-03 MED ORDER — OXYCODONE HCL 5 MG PO TABS: 5.0000 mg | ORAL_TABLET | ORAL | 0 refills | Status: DC | PRN

## 2019-08-03 MED ORDER — IBUPROFEN 600 MG PO TABS: 600.0000 mg | ORAL_TABLET | Freq: Four times a day (QID) | ORAL | 0 refills | Status: DC

## 2019-08-03 NOTE — Progress Notes (Signed)
Patient discharged to home, driving herself.  Baby placed in burial cradle and taken home with patient.  Patient has documentation to give to the funeral home including yellow ID tag and Certificate of Fetal Death.  Patient ambulated to car with RN, SN, and Chaplain.  No equipment for home ordered at discharge.

## 2019-08-03 NOTE — Discharge Summary (Signed)
Postpartum Discharge Summary  Date of Service updated 08/03/19     Patient Name: [name] DOB: 16-Jun-1985 MRN: [id number]  Date of admission: 08/01/2019 Delivering Provider: This patient has no babies on file.  Date of discharge: 08/03/2019  Admitting diagnosis: Fetal demise before 20 weeks with retention of dead fetus [O02.1] Intrauterine pregnancy: Unknown     Secondary diagnosis:  Active Problems:   Fetal demise before 20 weeks with retention of dead fetus   History of cesarean delivery   BMI 50.0-59.9, adult [facility])  Additional problems: NA     Discharge diagnosis: SVD at 19 weeks of FDIU                                                                                                Post partum procedures:None  Augmentation: Cytotec  Complications: None  Hospital course:  Pt was admitted for IOL due to FDIU at 18 1/2 weeks. See admitting H & P for additional information. Pt received ctyotec and delivered a nonviable female infant on 08/02/19. See delivery note for additional information. Postpartum course was unremarkable. Lochia was min. Progressed to ambulating, voiding, tolerating diet and good oral pain control. Amendable for discharge home on 08/03/19. Discharge medications, instructions and follow up reviewed with the pt. She verbalized understanding.  Delivery time: This patient has no babies on file.  Physical exam  Vitals:   08/03/19 0258 08/03/19 0314 08/03/19 0455 08/03/19 0743  BP: (!) 160/75 123/66 132/71 (!) 122/58  Pulse: 62 67 72 70  Resp: 18 17 18 19   Temp:  98 F (36.7 C) 98 F (36.7 C) 98.4 F (36.9 C)  TempSrc:  Oral Oral Oral  SpO2:  97% 99% 99%  Weight:      Height:       General: alert Lochia: appropriate Uterine Fundus: firm Incision: N/A DVT Evaluation: No evidence of DVT seen on physical exam. Labs: Lab Results  Component Value Date   WBC 14.4 (H) 08/03/2019   HGB 11.2 (L) 08/03/2019   HCT 34.0 (L) 08/03/2019   MCV 83.3  08/03/2019   PLT 249 08/03/2019   CMP Latest Ref Rng & Units 08/01/2019  Glucose 70 - 99 mg/dL 08/03/2019)  BUN 6 - 20 mg/dL 7  Creatinine 382(N - 0.53 mg/dL 9.76  Sodium 7.34 - 193 mmol/L 136  Potassium 3.5 - 5.1 mmol/L 3.2(L)  Chloride 98 - 111 mmol/L 104  CO2 22 - 32 mmol/L 23  Calcium 8.9 - 10.3 mg/dL 9.3  Total Protein 6.5 - 8.1 g/dL 6.2(L)  Total Bilirubin 0.3 - 1.2 mg/dL 0.4  Alkaline Phos 38 - 126 U/L 67  AST 15 - 41 U/L 19  ALT 0 - 44 U/L 20   Edinburgh Score: No flowsheet data found.  Discharge instruction: per After Visit Summary and "Baby and Me Booklet".  After visit meds:  Allergies as of 08/03/2019   No Known Allergies     Medication List    STOP taking these medications   aspirin EC 81 MG tablet   BC Headache 325-95-16 MG Tabs Generic drug: Aspirin-Salicylamide-Caffeine  penicillin v potassium 500 MG tablet Commonly known as: VEETID   promethazine 25 MG tablet Commonly known as: PHENERGAN     TAKE these medications   Blood Pressure Monitor Misc For regular home bp monitoring during pregnancy   ibuprofen 600 MG tablet Commonly known as: ADVIL Take 1 tablet (600 mg total) by mouth every 6 (six) hours.   oxyCODONE 5 MG immediate release tablet Commonly known as: Oxy IR/ROXICODONE Take 1 tablet (5 mg total) by mouth every 4 (four) hours as needed (pain scale 4-7).   prenatal multivitamin Tabs tablet Take 1 tablet by mouth daily at 12 noon.   sertraline 25 MG tablet Commonly known as: Zoloft Take 1 tablet (25 mg total) by mouth daily.       Diet: routine diet  Activity: Advance as tolerated. Pelvic rest for 6 weeks.   Outpatient follow up:2 weeks Follow up Appt:No future appointments. Follow up Visit: Follow-up Information    [facility]. Schedule an appointment as soon as possible for a visit in 2 week(s).   Specialty: Obstetrics and Gynecology Contact information: [address] [place]  [place] [phone]                Newborn Data: This patient has no babies on file. Baby Feeding: NA Disposition:morgue   08/03/2019 Chancy Milroy, MD

## 2019-08-03 NOTE — Progress Notes (Signed)
I offered support to [facility] throughout the day and provided ministry of presence as she processed Gabriel's birth and as she made decisions about arrangements.  I brought her information on follow up support including [employer] for her older children.  Chaplain Dyanne Carrel, Bcc Pager, [phone] 3:31 PM    08/03/19 1500  Clinical Encounter Type  Visited With Patient  Visit Type Spiritual support  Spiritual Encounters  Spiritual Needs Emotional;Grief support  Stress Factors  Patient Stress Factors Loss

## 2019-08-03 NOTE — Plan of Care (Signed)
  Problem: Coping: Goal: Ability to identify and utilize appropriate coping strategies will improve Outcome: Progressing Goal: Ability to identify and utilize available support systems will improve Outcome: Progressing Goal: Will verbalize feelings Outcome: Progressing Goal: Decrease level of anxiety will, Outcome: Progressing   Pt can identify support systems. Pt very tearful, however declining spiritual care at this time.

## 2019-08-04 LAB — RH IG WORKUP (INCLUDES ABO/RH)
ABO/RH(D): A NEG
Gestational Age(Wks): 19
Unit division: 0

## 2019-08-06 LAB — SURGICAL PATHOLOGY

## 2019-08-07 ENCOUNTER — Telehealth: Payer: Self-pay | Admitting: Women's Health

## 2019-08-07 NOTE — Telephone Encounter (Signed)
Pt states that she had her son stillborn on Thursday. Pts legs and feet are swelling really bad and she would like to discuss with a nurse. If not able to reach on mobile please [phone].

## 2019-08-07 NOTE — Telephone Encounter (Signed)
Telephoned patient at home number. Patient states is having swelling in legs and feet. No other symptoms. Advised patient to elevate feet. Patient schedule for nurse visit. Advised patient if headache of blurred vision would need to be evaluated. Patient voiced understanding.

## 2019-08-15 ENCOUNTER — Encounter: Payer: Self-pay | Admitting: Women's Health

## 2019-08-15 ENCOUNTER — Other Ambulatory Visit: Payer: Self-pay

## 2019-08-15 ENCOUNTER — Ambulatory Visit (INDEPENDENT_AMBULATORY_CARE_PROVIDER_SITE_OTHER): Payer: Medicaid Other | Admitting: Women's Health

## 2019-08-15 DIAGNOSIS — O021 Missed abortion: Secondary | ICD-10-CM | POA: Diagnosis not present

## 2019-08-15 MED ORDER — METHYLERGONOVINE MALEATE 0.2 MG PO TABS: 0.2000 mg | ORAL_TABLET | Freq: Three times a day (TID) | ORAL | 0 refills | Status: DC

## 2019-08-15 NOTE — Progress Notes (Signed)
   GYN VISIT Patient name: [name] MRN [id number]  Date of birth: [birth year] Chief Complaint:   Follow-up  History of Present Illness:   [name] is a 34 y.o. [phone] Caucasian female being seen today for f/u after IOL for IUFD @ [redacted]w[redacted]d, delivered on 08/03/19. Doing ok. Still bleeding, passed large clot on Monday, cramping still. Declines counseling/therapy, has good support system. Had funeral service for son 'Velora Heckler'. Does not want to get pregnant again right now, plans to use condoms/pullout.      No LMP recorded.  Last pap 10/04/16. 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  Pertinent History Reviewed:  Reviewed past medical,surgical, social, obstetrical and family history.  Reviewed problem list, medications and allergies. Physical Assessment:   Vitals:   08/15/19 1028 08/15/19 1155  BP: (!) 151/93 135/86  Pulse: 91   Weight: (!) 307 lb (139.3 kg)   Height: 5\' 6"  (1.676 m)   Body mass index is 49.55 kg/m.       Physical Examination:   General appearance: alert, well appearing, and in no distress  Mental status: alert, oriented to person, place, and time  Skin: warm & dry   Cardiovascular: normal heart rate noted  Respiratory: normal respiratory effort, no distress  Abdomen: soft, non-tender   Pelvic: examination not indicated  Extremities: no edema   Chaperone: n/a    No results found for this or any previous visit (from the past 24 hour(s)).  Assessment & Plan:  1) F/U after IOL for 18wk IUFD> doing well, subinvolution- will rx methergine  2) Initially elevated bp> normal on repeat  3) Abnormal pap> needs repeat  Meds:  Meds ordered this  encounter  Medications  . methylergonovine (METHERGINE) 0.2 MG tablet    Sig: Take 1 tablet (0.2 mg total) by mouth 3 (three) times daily.    Dispense:  15 tablet    Refill:  0    Order Specific Question:   Supervising Provider    Answer:   , LUTHER H [[id number]]    No orders of the defined types were placed in this encounter.   Return in about 4 weeks (around 09/12/2019) for Pap & physical.  09/14/2019 CNM, [facility] 08/15/2019 11:56 AM

## 2019-09-06 ENCOUNTER — Ambulatory Visit (INDEPENDENT_AMBULATORY_CARE_PROVIDER_SITE_OTHER): Payer: Medicaid Other | Admitting: Advanced Practice Midwife

## 2019-09-06 ENCOUNTER — Encounter: Payer: Self-pay | Admitting: Advanced Practice Midwife

## 2019-09-06 DIAGNOSIS — O021 Missed abortion: Secondary | ICD-10-CM

## 2019-09-06 NOTE — Progress Notes (Signed)
Post Partum Visit Note  [name] is a 34 y.o. [phone] female who presents for a postpartum visit. She is 4 weeks postpartum following a SVD of a 19 week IUFD.   I have fully reviewed the prenatal and intrapartum course. The delivery was at 19 gestational weeks.  Anesthesia: IV sedation. Postpartum course has been uneventful.  Review of Systems Pertinent items are noted in HPI.   Objective:  Blood pressure 133/85, pulse 92, height 5\' 6"  (1.676 m), weight (!) 308 lb 9.6 oz (140 kg), last menstrual period 09/03/2019, unknown if currently breastfeeding.   Still having bleeding, seems like a period because the flow increased.   General:  alert, cooperative, and no distress   Breasts:  negative  Lungs: Normal respiratory effort  Heart:  regular rate and rhythm  Abdomen: soft, non-tender; bowel sounds normal; no masses   Vulva: Declined exam  Vagina:   Cervix:    Corpus:   Adnexa:    Rectal Exam:         Assessment:    normal postpartum exam. Pap smear not done at today's visit per pt's request.    She followed up w/Kim 09/05/2019 on 4/28.  Pt was offered therapy and birth control, declined both. Offered both today as well, and declined.   Plan:   Essential components of care per ACOG recommendations:  1.  Mood and well being: Patient with negative depression screening today. Reiterated that if she wanted to start therapy just let 5/28 know.   5. Physical Recovery  - Patient has urinary incontinence? No- Patient is safe to resume physical and sexual activity.  Aware that PNV are still recommended for prevention of spine defects, states that it is too painful right now to take them.   6.  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 Wants to come back 5/26 for pap and not do it today.  Pt followed up w/Kim 6/26 on 4/28. She was offered therapy and birth control, declined both. Offered both today as well, and declined. Offered to do pap  today, pt declined.   5/28, CNM [facility], [facility]

## 2019-09-12 ENCOUNTER — Other Ambulatory Visit: Payer: Medicaid Other | Admitting: Women's Health

## 2019-10-04 ENCOUNTER — Other Ambulatory Visit: Payer: Self-pay

## 2019-10-04 ENCOUNTER — Ambulatory Visit: Payer: Medicaid Other | Admitting: Orthopaedic Surgery

## 2019-10-04 ENCOUNTER — Encounter: Payer: Self-pay | Admitting: Orthopaedic Surgery

## 2019-10-04 ENCOUNTER — Ambulatory Visit: Payer: Medicaid Other

## 2019-10-04 VITALS — BP 155/91 | HR 74 | Ht 66.0 in | Wt 314.4 lb

## 2019-10-04 DIAGNOSIS — M25561 Pain in right knee: Secondary | ICD-10-CM

## 2019-10-04 DIAGNOSIS — G8929 Other chronic pain: Secondary | ICD-10-CM

## 2019-10-04 DIAGNOSIS — Z6841 Body Mass Index (BMI) 40.0 and over, adult: Secondary | ICD-10-CM

## 2019-10-04 NOTE — Patient Instructions (Signed)

## 2019-10-04 NOTE — Progress Notes (Signed)
Subjective:    Patient ID: [name], female    DOB: Mar 11, 1986, 34 y.o.   MRN: [id number]  HPI She has pain in both knees for over two years, the right more than the left.  She has swelling and popping and giving way.  She has no trauma.  She has tried Advil, ice, rubs, heat, braces with no help.  She is tired of hurting and the knees giving way.   Review of Systems  Constitutional: Positive for activity change.  Musculoskeletal: Positive for arthralgias, gait problem and joint swelling.  All other systems reviewed and are negative.  For Review of Systems, all other systems reviewed and are negative.  The following is a summary of the past history medically, past history surgically, known current medicines, social history and family history.  This information is gathered electronically by the computer from prior information and documentation.  I review this each visit and have found including this information at this point in the chart is beneficial and informative.   Past Medical History:  Diagnosis Date  . Kidney infection   . UTI (urinary tract infection)   . Vaginal Pap smear, abnormal     Past Surgical History:  Procedure Laterality Date  . 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 CESAREAN SECTION    . COLPOSCOPY      Current Outpatient Medications on File Prior to Visit  Medication Sig Dispense Refill  . sertraline (ZOLOFT) 25 MG tablet Take 1 tablet (25 mg total) by mouth daily. (Patient not taking: Reported on 10/04/2019) 60 tablet 3   No current facility-administered medications on file prior to visit.    Social History   Socioeconomic History  . Marital status: Single    Spouse name: Not on file  . Number of children: 3  . Years of education: Not on file  . Highest education level: Not on file  Occupational History  . Not on file  Tobacco Use  . Smoking status: Light Tobacco Smoker    Packs/day: 0.25    Years: 2.00    Pack years:  0.50    Types: Cigarettes  . Smokeless tobacco: Never Used  . Tobacco comment: "2-3 cigs per day"  Vaping Use  . Vaping Use: Never used  Substance and Sexual Activity  . Alcohol use: No  . Drug use: No  . Sexual activity: Not Currently    Birth control/protection: None  Other Topics Concern  . Not on file  Social History Narrative  . Not on file   Social Determinants of Health   Financial Resource Strain: Low Risk   . Difficulty of Paying Living Expenses: Not very hard  Food Insecurity: No Food Insecurity  . Worried About Programme researcher, broadcasting/film/video in the Last Year: Never true  . Ran Out of Food in the Last Year: Never true  Transportation Needs: No Transportation Needs  . Lack of Transportation (Medical): No  . Lack of Transportation (Non-Medical): No  Physical Activity: Insufficiently Active  . Days of Exercise per Week: 2 days  . Minutes of Exercise per Session: 20 min  Stress: No Stress Concern Present  . Feeling of Stress : Only a little  Social Connections: Moderately Integrated  . Frequency of Communication with Friends and Family: Twice a week  . Frequency of Social Gatherings with Friends and Family: Twice a week  . Attends Religious Services: 1 to 4 times per year  . Active  Member of Clubs or Organizations: No  . Attends Archivist Meetings: 1 to 4 times per year  . Marital Status: Never married  Intimate Partner Violence: Not At Risk  . Fear of Current or Ex-Partner: No  . Emotionally Abused: No  . Physically Abused: No  . Sexually Abused: No    Family History  Problem Relation Age of Onset  . Cancer Other   . Diabetes Other   . Thyroid disease Mother   . Cancer Maternal Grandmother   . Asthma Maternal Grandmother   . Arthritis Maternal Grandmother   . Cancer Maternal Grandfather   . Diabetes Maternal Grandfather   . Thyroid disease Maternal Aunt     BP (!) 155/91   Pulse 74   Ht 5\' 6"  (1.676 m)   Wt (!) 314 lb 6 oz (142.6 kg)   BMI 50.74  kg/m   Body mass index is 50.74 kg/m.      Objective:   Physical Exam Vitals and nursing note reviewed.  Constitutional:      Appearance: She is well-developed.  HENT:     Head: Normocephalic and atraumatic.  Eyes:     Conjunctiva/sclera: Conjunctivae normal.     Pupils: Pupils are equal, round, and reactive to light.  Cardiovascular:     Rate and Rhythm: Normal rate and regular rhythm.  Pulmonary:     Effort: Pulmonary effort is normal.  Abdominal:     Palpations: Abdomen is soft.  Musculoskeletal:     Cervical back: Normal range of motion and neck supple.       Legs:  Skin:    General: Skin is warm and dry.  Neurological:     Mental Status: She is alert and oriented to person, place, and time.     Cranial Nerves: No cranial nerve deficit.     Motor: No abnormal muscle tone.     Coordination: Coordination normal.     Deep Tendon Reflexes: Reflexes are normal and symmetric. Reflexes normal.  Psychiatric:        Behavior: Behavior normal.        Thought Content: Thought content normal.        Judgment: Judgment normal.   x-rays were done of the right knee, reported separately.        Assessment & Plan:   Encounter Diagnoses  Name Primary?  . Chronic pain of right knee Yes  . Body mass index 50.0-59.9, adult ([place])   . Morbid obesity ([place])    I have shown her the X-rays.  She is a candidate for total knee.  PROCEDURE NOTE:  The patient requests injections of the right knee , verbal consent was obtained.  The right knee was prepped appropriately after time out was performed.   Sterile technique was observed and injection of 1 cc of Depo-Medrol 40 mg with several cc's of plain xylocaine. Anesthesia was provided by ethyl chloride and a 20-gauge needle was used to inject the knee area. The injection was tolerated well.  A band aid dressing was applied.  The patient was advised to apply ice later today and tomorrow to the injection sight as needed.  Return  in three weeks.  X-rays of the left knee then.  Call if any problem.  Precautions discussed.   Electronically Signed Sanjuana Kava, MD 6/17/20219:43 AM

## 2019-10-08 ENCOUNTER — Telehealth: Payer: Self-pay | Admitting: Orthopaedic Surgery

## 2019-10-08 MED ORDER — NAPROXEN 500 MG PO TABS: 500.0000 mg | ORAL_TABLET | Freq: Two times a day (BID) | ORAL | 5 refills | Status: DC

## 2019-10-08 NOTE — Telephone Encounter (Addendum)
[name] called and stated she thought you were going to send something in for her for arthritis.  She said she has checked with [facility] and they don't have anything for her.  Can you do this for her?  Thanks   Patient called back this afternoon.  She also wants something for pain

## 2019-10-25 ENCOUNTER — Ambulatory Visit: Payer: Medicaid Other

## 2019-10-25 ENCOUNTER — Ambulatory Visit: Payer: Medicaid Other | Admitting: Orthopaedic Surgery

## 2019-10-25 ENCOUNTER — Other Ambulatory Visit: Payer: Self-pay

## 2019-10-25 ENCOUNTER — Encounter: Payer: Self-pay | Admitting: Orthopaedic Surgery

## 2019-10-25 VITALS — BP 138/95 | HR 68 | Ht 66.0 in | Wt 308.0 lb

## 2019-10-25 DIAGNOSIS — G8929 Other chronic pain: Secondary | ICD-10-CM

## 2019-10-25 DIAGNOSIS — M25562 Pain in left knee: Secondary | ICD-10-CM | POA: Diagnosis not present

## 2019-10-25 DIAGNOSIS — Z6841 Body Mass Index (BMI) 40.0 and over, adult: Secondary | ICD-10-CM | POA: Diagnosis not present

## 2019-10-25 NOTE — Progress Notes (Signed)
Patient [name], female DOB:22-Feb-1986, 34 y.o. RSW:[id number]  Chief Complaint  Patient presents with  . knock knee deformity    xrays    HPI  [name] is a 34 y.o. female who has bilateral knee pain. She is here today for the left knee.  I had seen her for the right knee recently.  She has injection of the right knee but it did not help much.   She has knocked-knees.  They swell and pop and hurt.  She has no giving way.  Nothing much helps her.   Body mass index is 49.71 kg/m.  The patient meets the AMA guidelines for Morbid (severe) obesity with a BMI > 40.0 and I have recommended weight loss.   ROS  Review of Systems  Constitutional: Positive for activity change.  Musculoskeletal: Positive for arthralgias, gait problem and joint swelling.  All other systems reviewed and are negative.   All other systems reviewed and are negative.  The following is a summary of the past history medically, past history surgically, known current medicines, social history and family history.  This information is gathered electronically by the computer from prior information and documentation.  I review this each visit and have found including this information at this point in the chart is beneficial and informative.    Past Medical History:  Diagnosis Date  . Kidney infection   . UTI (urinary tract infection)   . Vaginal Pap smear, abnormal     Past Surgical History:  Procedure Laterality Date  . 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 CESAREAN SECTION    . COLPOSCOPY      Family History  Problem Relation Age of Onset  . Cancer Other   . Diabetes Other   . Thyroid disease Mother   . Cancer Maternal Grandmother   . Asthma Maternal Grandmother   . Arthritis Maternal Grandmother   . Cancer Maternal Grandfather   . Diabetes Maternal Grandfather   . Thyroid disease Maternal Aunt     Social History Social History   Tobacco Use  . Smoking status:  Light Tobacco Smoker    Packs/day: 0.25    Years: 2.00    Pack years: 0.50    Types: Cigarettes  . Smokeless tobacco: Never Used  . Tobacco comment: "2-3 cigs per day"  Vaping Use  . Vaping Use: Never used  Substance Use Topics  . Alcohol use: No  . Drug use: No    No Known Allergies  Current Outpatient Medications  Medication Sig Dispense Refill  . naproxen (NAPROSYN) 500 MG tablet Take 1 tablet (500 mg total) by mouth 2 (two) times daily with a meal. 60 tablet 5   No current facility-administered medications for this visit.     Physical Exam  Blood pressure (!) 138/95, pulse 68, height 5\' 6"  (1.676 m), weight (!) 308 lb (139.7 kg), unknown if currently breastfeeding.  Constitutional: overall normal hygiene, normal nutrition, well developed, normal grooming, normal body habitus. Assistive device:none  Musculoskeletal: gait and station Limp left, muscle tone and strength are normal, no tremors or atrophy is present.  .  Neurological: coordination overall normal.  Deep tendon reflex/nerve stretch intact.  Sensation normal.  Cranial nerves II-XII intact.   Skin:   Normal overall no scars, lesions, ulcers or rashes. No psoriasis.  Psychiatric: Alert and oriented x 3.  Recent memory intact, remote memory unclear.  Normal mood and affect. Well groomed.  Good eye contact.  Cardiovascular: overall no swelling, no varicosities, no edema bilaterally, normal temperatures of the legs and arms, no clubbing, cyanosis and good capillary refill.  Lymphatic: palpation is normal.  Left knee with pain, effusion, crepitus, limp left, ROM 0 to 105, more pain laterally.  NV intact.  All other systems reviewed and are negative   The patient has been educated about the nature of the problem(s) and counseled on treatment options.  The patient appeared to understand what I have discussed and is in agreement with it.  Encounter Diagnosis  Name Primary?  . Chronic pain of left knee Yes    X-rays were done of the left knee, reported separately.  PROCEDURE NOTE:  The patient requests injections of the left knee , verbal consent was obtained.  The left knee was prepped appropriately after time out was performed.   Sterile technique was observed and injection of 1 cc of Depo-Medrol 40 mg with several cc's of plain xylocaine. Anesthesia was provided by ethyl chloride and a 20-gauge needle was used to inject the knee area. The injection was tolerated well.  A band aid dressing was applied.  The patient was advised to apply ice later today and tomorrow to the injection sight as needed.  PLAN Call if any problems.  Precautions discussed.  Continue current medications.   Return to clinic 1 month   She is candidate for total knees.  She is to talk about [employer] Program.  Electronically Signed Darreld Mclean, MD 7/8/202110:23 AM

## 2019-11-08 ENCOUNTER — Ambulatory Visit (INDEPENDENT_AMBULATORY_CARE_PROVIDER_SITE_OTHER): Payer: Medicaid Other | Admitting: Women's Health

## 2019-11-08 ENCOUNTER — Encounter: Payer: Self-pay | Admitting: Women's Health

## 2019-11-08 ENCOUNTER — Other Ambulatory Visit (HOSPITAL_COMMUNITY)
Admission: RE | Admit: 2019-11-08 | Discharge: 2019-11-08 | Disposition: A | Discharge status: Home / Self Care | Payer: Medicaid Other | Source: Ambulatory Visit | Attending: Obstetrics and Gynecology | Admitting: Obstetrics and Gynecology

## 2019-11-08 VITALS — BP 171/95 | HR 73 | Ht 66.0 in | Wt 309.6 lb

## 2019-11-08 DIAGNOSIS — Z1329 Encounter for screening for other suspected endocrine disorder: Secondary | ICD-10-CM

## 2019-11-08 DIAGNOSIS — Z01419 Encounter for gynecological examination (general) (routine) without abnormal findings: Secondary | ICD-10-CM

## 2019-11-08 DIAGNOSIS — Z202 Contact with and (suspected) exposure to infections with a predominantly sexual mode of transmission: Secondary | ICD-10-CM

## 2019-11-08 DIAGNOSIS — Z124 Encounter for screening for malignant neoplasm of cervix: Secondary | ICD-10-CM

## 2019-11-08 DIAGNOSIS — I10 Essential (primary) hypertension: Secondary | ICD-10-CM | POA: Diagnosis not present

## 2019-11-08 DIAGNOSIS — R638 Other symptoms and signs concerning food and fluid intake: Secondary | ICD-10-CM | POA: Diagnosis not present

## 2019-11-08 DIAGNOSIS — F329 Major depressive disorder, single episode, unspecified: Secondary | ICD-10-CM | POA: Diagnosis not present

## 2019-11-08 DIAGNOSIS — R8290 Unspecified abnormal findings in urine: Secondary | ICD-10-CM

## 2019-11-08 DIAGNOSIS — F32A Depression, unspecified: Secondary | ICD-10-CM

## 2019-11-08 LAB — POCT URINALYSIS DIPSTICK
Blood, UA: NEGATIVE
Glucose, UA: NEGATIVE
Ketones, UA: NEGATIVE
Leukocytes, UA: NEGATIVE
Nitrite, UA: NEGATIVE
Protein, UA: NEGATIVE

## 2019-11-08 MED ORDER — AZITHROMYCIN 500 MG PO TABS: 1000.0000 mg | ORAL_TABLET | Freq: Once | ORAL | 0 refills | Status: AC

## 2019-11-08 MED ORDER — AMLODIPINE BESYLATE 5 MG PO TABS: 5.0000 mg | ORAL_TABLET | Freq: Every day | ORAL | 6 refills | Status: DC

## 2019-11-08 MED ORDER — SERTRALINE HCL 25 MG PO TABS: 25.0000 mg | ORAL_TABLET | Freq: Every day | ORAL | 6 refills | Status: DC

## 2019-11-08 NOTE — Progress Notes (Signed)
WELL-WOMAN EXAMINATION Patient name: [name] MRN [id number]  Date of birth: 1986/03/18 Chief Complaint:   Gynecologic Exam  History of Present Illness:   [name] is a 34 y.o. [phone] Caucasian female being seen today for a routine well-woman exam.  Current complaints: feels like she needs to restart zoloft. Still having a hard time with the loss of her son Jerrel Ivory in April @ [redacted]wks gestation. Declines therapy right now. Does not want to get pregnant again right now, but does not want birth control, will use condoms.  Partner was told he has chlamydia, although he doesn't believe diagnosis. Pt reports malodorous urine x . No abnormal vaginal discharge/itching/irritation.  Feels bloated Unable to lose weight despite eating less BP elevated today, review of chart shows elevated bp at last 2 visits w/ Dr. Hilda Lias as well  Depression screen [facility] 2/9 11/08/2019 06/20/2019  Decreased Interest 1 2  Down, Depressed, Hopeless 2 2  PHQ - 2 Score 3 4  Altered sleeping 1 0  Tired, decreased energy 1 1  Change in appetite 0 0  Feeling bad or failure about yourself  0 0  Trouble concentrating 0 1  Moving slowly or fidgety/restless 0 0  Suicidal thoughts 0 -  PHQ-9 Score 5 6  Difficult doing work/chores Not difficult at all -     PCP: none      Does want to check TSH, everything else was checked in recent pregnancy Patient's last menstrual period was 11/01/2019 (exact date). The current method of family planning is none.  Last pap 10/04/16. Results were: abnormal LSIL/HPV w/ normal colpo, did not f/u for repeat pap H/O abnormal pap: yes Last mammogram: never. Results were: N/A. Family h/o breast cancer: no Last colonoscopy: never. Results were: N/A. Family h/o colorectal cancer: yes thinks a grandparent had Review of Systems:   Pertinent items are noted in HPI Denies any headaches, blurred vision, fatigue, shortness of breath, chest pain, abdominal pain, abnormal vaginal  discharge/itching/odor/irritation, problems with periods, bowel movements, urination, or intercourse unless otherwise stated above. Pertinent History Reviewed:  Reviewed past medical,surgical, social and family history.  Reviewed problem list, medications and allergies. Physical Assessment:   Vitals:   11/08/19 0838 11/08/19 0841  BP: (!) 175/90 (!) 171/95  Pulse: 90 73  Weight: (!) 309 lb 9.6 oz (140.4 kg)   Height: 5\' 6"  (1.676 m)   Body mass index is 49.97 kg/m.        Physical Examination:   General appearance - well appearing, and in no distress  Mental status - alert, oriented to person, place, and time  Psych:  She has a normal mood and affect  Skin - warm and dry, normal color, no suspicious lesions noted  Chest - effort normal, all lung fields clear to auscultation bilaterally  Heart - normal rate and regular rhythm  Neck:  midline trachea, no thyromegaly or nodules  Breasts - breasts appear normal, no suspicious masses, no skin or nipple changes or  axillary nodes  Abdomen - soft, nontender, nondistended, no masses or organomegaly  Pelvic - VULVA: normal appearing vulva with no masses, tenderness or lesions  VAGINA: normal appearing vagina with normal color and malodorous discharge, no lesions  CERVIX: normal appearing cervix without discharge or lesions, no CMT  Thin prep pap is done w/ HR HPV cotesting  UTERUS: unable to adequately assess d/t habitus, no tenderness ADNEXA: unable to adequately assess d/t habitus, no tenderness  Extremities:  No swelling or varicosities  noted  Chaperone: pt declined    Results for orders placed or performed in visit on 11/08/19 (from the past 24 hour(s))  POCT Urinalysis Dipstick   Collection Time: 11/08/19  9:18 AM  Result Value Ref Range   Color, UA     Clarity, UA     Glucose, UA Negative Negative   Bilirubin, UA     Ketones, UA n    Spec Grav, UA     Blood, UA n    pH, UA     Protein, UA Negative Negative   Urobilinogen, UA      Nitrite, UA n    Leukocytes, UA Negative Negative   Appearance     Odor      Assessment & Plan:  1) Well-Woman Exam  2) H/O abnormal pap> repeat today  3) CHTN>officially dx today, rx norvasc, return next week for bp check w/ nurse  4) Exposure to chlamydia> rx azithromycin, no sex x at least 7d, will check gc/ct from pap  5) Depression> restart zoloft 25mg  daily, new rx sent. Understands takes a few weeks to notice improvement. F/U 4wks. Discussed naproxen + zoloft can increase r/f GI bleed. If notices any sx, to let know/go to ED. Pt states naproxen isn't even helping knee/leg pain- to discuss w/ Dr. [place] if ok to stop  6) Unable to lose weight> check TSH  Labs/procedures today: pap, labs  Mammogram @35yo  or sooner if problems Colonoscopy @35yo  or sooner if problems  Orders Placed This Encounter  Procedures  . TSH  . POCT Urinalysis Dipstick    Meds:  Meds ordered this encounter  Medications  . sertraline (ZOLOFT) 25 MG tablet    Sig: Take 1 tablet (25 mg total) by mouth daily.    Dispense:  30 tablet    Refill:  6    Order Specific Question:   Supervising Provider    Answer:   Hilda Lias, LUTHER H [[id number]]  . amLODipine (NORVASC) 5 MG tablet    Sig: Take 1 tablet (5 mg total) by mouth daily.    Dispense:  30 tablet    Refill:  6    Order Specific Question:   Supervising Provider    Answer:   , LUTHER H [[id number]]  . azithromycin (ZITHROMAX) 500 MG tablet    Sig: Take 2 tablets (1,000 mg total) by mouth once for 1 dose.    Dispense:  2 tablet    Refill:  0    Order Specific Question:   Supervising Provider    Answer:   [[id number]]    Follow-up: Return in about 1 week (around 11/15/2019) for bp check w/ nurse; then 4wks for med f/u w/ me.  Despina Hidden CNM, WHNP-BC 11/08/2019 11:06 AM

## 2019-11-09 LAB — TSH: TSH: 1.46 u[IU]/mL (ref 0.450–4.500)

## 2019-11-12 LAB — CYTOLOGY - PAP
Chlamydia: NEGATIVE
Comment: NEGATIVE
Comment: NEGATIVE
Comment: NORMAL
Diagnosis: UNDETERMINED — AB
High risk HPV: NEGATIVE
Neisseria Gonorrhea: NEGATIVE

## 2019-11-15 ENCOUNTER — Ambulatory Visit (INDEPENDENT_AMBULATORY_CARE_PROVIDER_SITE_OTHER): Payer: Medicaid Other | Admitting: *Deleted

## 2019-11-15 DIAGNOSIS — I10 Essential (primary) hypertension: Secondary | ICD-10-CM

## 2019-11-15 NOTE — Progress Notes (Signed)
Chart reviewed for nurse visit. Agree with plan of care.  Adline Potter, NP 11/15/2019 4:10 PM

## 2019-11-15 NOTE — Progress Notes (Signed)
   NURSE VISIT- BLOOD PRESSURE CHECK  SUBJECTIVE:  [name] is a 34 y.o. [phone] female here for BP check. She is a GYN patient    HYPERTENSION ROS:  GYN patient: . Taking medicines as instructed yes . Headaches  No . Chest pain No . Shortness of breath No . Swelling in legs/ankles No  OBJECTIVE:  BP (!) 138/87 (BP Location: Left Arm, Patient Position: Sitting, Cuff Size: Normal)   Pulse 73   LMP 11/01/2019 (Exact Date)   Appearance alert, well appearing, and in no distress.  ASSESSMENT: GYN  blood pressure check  PLAN: Discussed with Cyril Mourning, AGNP   Recommendations: no changes needed   Follow-up: as scheduled   [name]  11/15/2019 9:57 AM

## 2019-11-22 ENCOUNTER — Ambulatory Visit (INDEPENDENT_AMBULATORY_CARE_PROVIDER_SITE_OTHER): Payer: Medicaid Other | Admitting: Orthopaedic Surgery

## 2019-11-22 ENCOUNTER — Other Ambulatory Visit: Payer: Self-pay

## 2019-11-22 ENCOUNTER — Encounter: Payer: Self-pay | Admitting: Orthopaedic Surgery

## 2019-11-22 VITALS — Ht 66.0 in | Wt 307.0 lb

## 2019-11-22 DIAGNOSIS — M7061 Trochanteric bursitis, right hip: Secondary | ICD-10-CM | POA: Diagnosis not present

## 2019-11-22 DIAGNOSIS — Z6841 Body Mass Index (BMI) 40.0 and over, adult: Secondary | ICD-10-CM

## 2019-11-22 NOTE — Progress Notes (Signed)
Patient VO:ZDGUY [name], female DOB:[birth year], 34 y.o. QIH:[id number]  Chief Complaint  Patient presents with  . Knee Pain    bilateral   . Hip Pain    right     HPI  [name] is a 34 y.o. female who has developed pain of the right lateral hip.  It is very painful at times.  She has no trauma.  Pain is localized. She has no distal edema.  The medicine she is taking for her knees has not helped it.   Body mass index is 49.55 kg/m.  The patient meets the AMA guidelines for Morbid (severe) obesity with a BMI > 40.0 and I have recommended weight loss.   ROS  Review of Systems  Constitutional: Positive for activity change.  Musculoskeletal: Positive for arthralgias, gait problem and joint swelling.  All other systems reviewed and are negative.   All other systems reviewed and are negative.  The following is a summary of the past history medically, past history surgically, known current medicines, social history and family history.  This information is gathered electronically by the computer from prior information and documentation.  I review this each visit and have found including this information at this point in the chart is beneficial and informative.    Past Medical History:  Diagnosis Date  . Arthritis   . Kidney infection   . UTI (urinary tract infection)   . Vaginal Pap smear, abnormal     Past Surgical History:  Procedure Laterality Date  . 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 CESAREAN SECTION    . COLPOSCOPY      Family History  Problem Relation Age of Onset  . Cancer Other   . Diabetes Other   . Thyroid disease Mother   . Cancer Maternal Grandmother   . Asthma Maternal Grandmother   . Arthritis Maternal Grandmother   . Cancer Maternal Grandfather   . Diabetes Maternal Grandfather   . Thyroid disease Maternal Aunt     Social History Social History   Tobacco Use  . Smoking status: Light Tobacco Smoker    Packs/day:  0.25    Years: 2.00    Pack years: 0.50    Types: Cigarettes  . Smokeless tobacco: Never Used  . Tobacco comment: "2-3 cigs per day"  Vaping Use  . Vaping Use: Never used  Substance Use Topics  . Alcohol use: No  . Drug use: No    No Known Allergies  Current Outpatient Medications  Medication Sig Dispense Refill  . amLODipine (NORVASC) 5 MG tablet Take 1 tablet (5 mg total) by mouth daily. 30 tablet 6  . naproxen (NAPROSYN) 500 MG tablet Take 1 tablet (500 mg total) by mouth 2 (two) times daily with a meal. 60 tablet 5  . sertraline (ZOLOFT) 25 MG tablet Take 1 tablet (25 mg total) by mouth daily. 30 tablet 6   No current facility-administered medications for this visit.     Physical Exam  Height 5\' 6"  (1.676 m), weight (!) 307 lb (139.3 kg), last menstrual period 11/01/2019, unknown if currently breastfeeding.  Constitutional: overall normal hygiene, normal nutrition, well developed, normal grooming, normal body habitus. Assistive device:none  Musculoskeletal: gait and station Limp righit, muscle tone and strength are normal, no tremors or atrophy is present.  .  Neurological: coordination overall normal.  Deep tendon reflex/nerve stretch intact.  Sensation normal.  Cranial nerves II-XII intact.   Skin:  Normal overall no scars, lesions, ulcers or rashes. No psoriasis.  Psychiatric: Alert and oriented x 3.  Recent memory intact, remote memory unclear.  Normal mood and affect. Well groomed.  Good eye contact.  Cardiovascular: overall no swelling, no varicosities, no edema bilaterally, normal temperatures of the legs and arms, no clubbing, cyanosis and good capillary refill.  Lymphatic: palpation is normal.  Right lateral hip is tender over the bursa area of trochanter. ROM of hip is full.  NV intact.  No redness is present.  All other systems reviewed and are negative   The patient has been educated about the nature of the problem(s) and counseled on treatment  options.  The patient appeared to understand what I have discussed and is in agreement with it.  Encounter Diagnoses  Name Primary?  . Trochanteric bursitis, right hip Yes  . Body mass index 45.0-49.9, adult ([facility])   . Morbid obesity ([facility])     PROCEDURE NOTE:  The patient request injection, verbal consent was obtained.  The right trochanteric area of the hip was prepped appropriately after time out was performed.   Sterile technique was observed and injection of 1 cc of Depo-Medrol 40 mg with several cc's of plain xylocaine. Anesthesia was provided by ethyl chloride and a 20-gauge needle was used to inject the hip area. The injection was tolerated well.  A band aid dressing was applied.  The patient was advised to apply ice later today and tomorrow to the injection sight as needed.  PLAN Call if any problems.  Precautions discussed.  Continue current medications.   Return to clinic 1 month   Electronically Signed Darreld Mclean, MD 8/5/20219:42 AM

## 2019-12-06 ENCOUNTER — Ambulatory Visit (INDEPENDENT_AMBULATORY_CARE_PROVIDER_SITE_OTHER): Payer: Medicaid Other | Admitting: Women's Health

## 2019-12-06 ENCOUNTER — Other Ambulatory Visit: Payer: Self-pay

## 2019-12-06 ENCOUNTER — Encounter: Payer: Self-pay | Admitting: Women's Health

## 2019-12-06 VITALS — BP 112/73 | HR 72 | Ht 66.0 in | Wt 305.6 lb

## 2019-12-06 DIAGNOSIS — R300 Dysuria: Secondary | ICD-10-CM | POA: Diagnosis not present

## 2019-12-06 DIAGNOSIS — F329 Major depressive disorder, single episode, unspecified: Secondary | ICD-10-CM | POA: Diagnosis not present

## 2019-12-06 DIAGNOSIS — F32A Depression, unspecified: Secondary | ICD-10-CM

## 2019-12-06 LAB — POCT URINALYSIS DIPSTICK OB
Glucose, UA: NEGATIVE
Ketones, UA: NEGATIVE
Leukocytes, UA: NEGATIVE
Nitrite, UA: NEGATIVE
POC,PROTEIN,UA: NEGATIVE

## 2019-12-06 MED ORDER — SERTRALINE HCL 50 MG PO TABS: 50.0000 mg | ORAL_TABLET | Freq: Every day | ORAL | 6 refills | Status: DC

## 2019-12-06 NOTE — Progress Notes (Signed)
GYN VISIT Patient name: [name] MRN [id number]  Date of birth: 1985/07/14 Chief Complaint:   Follow-up (depression)  History of Present Illness:   [name] is a 34 y.o. [phone] Caucasian female being seen today for f/u on zoloft 25mg  rx'd on 11/08/19 for depression. States it had started to work at first, now just feels like she has plateaud.  Declines therapy. Suggested increasing zoloft dose, she wants to try this.  Also reports low back pain, pressure when voiding, thinks she could have UTI.      Depression screen [facility] 2/9 12/06/2019 11/08/2019 06/20/2019  Decreased Interest 1 1 2   Down, Depressed, Hopeless 1 2 2   PHQ - 2 Score 2 3 4   Altered sleeping 0 1 0  Tired, decreased energy 0 1 1  Change in appetite 0 0 0  Feeling bad or failure about yourself  0 0 0  Trouble concentrating 0 0 1  Moving slowly or fidgety/restless 1 0 0  Suicidal thoughts 0 0 -  PHQ-9 Score 3 5 6   Difficult doing work/chores Somewhat difficult Not difficult at all -    Patient's last menstrual period was 11/27/2019. The current method of family planning is none.  Last pap 11/08/19. Results were:  abnormal  ASCUS w/ -HRHPV Review of Systems:   Pertinent items are noted in HPI Denies fever/chills, dizziness, headaches, visual disturbances, fatigue, shortness of breath, chest pain, abdominal pain, vomiting, abnormal vaginal discharge/itching/odor/irritation, problems with periods, bowel movements, urination, or intercourse unless otherwise stated above.  Pertinent History Reviewed:  Reviewed past medical,surgical, social, obstetrical and family history.  Reviewed problem list, medications and allergies. Physical Assessment:   Vitals:   12/06/19 0854  BP: 112/73  Pulse: 72  Weight: (!) 305 lb 9.6 oz (138.6 kg)  Height: 5\' 6"  (1.676 m)  Body mass index is 49.33 kg/m.       Physical Examination:   General appearance: alert, well appearing, and in no distress  Mental status: alert, oriented to  person, place, and time  Skin: warm & dry   Cardiovascular: normal heart rate noted  Respiratory: normal respiratory effort, no distress  Abdomen: soft, non-tender   Pelvic: examination not indicated  Extremities: no edema   Chaperone: n/a    Results for orders placed or performed in visit on 12/06/19 (from the past 24 hour(s))  POC Urinalysis Dipstick OB   Collection Time: 12/06/19  9:24 AM  Result Value Ref Range   Color, UA     Clarity, UA     Glucose, UA Negative Negative   Bilirubin, UA     Ketones, UA neg    Spec Grav, UA     Blood, UA small    pH, UA     POC,PROTEIN,UA Negative Negative, Trace, Small (1+), Moderate (2+), Large (3+), 4+   Urobilinogen, UA     Nitrite, UA neg    Leukocytes, UA Negative Negative   Appearance     Odor      Assessment & Plan:  1) Depression> not much better, will increase zoloft to 50mg , f/u 4wks. Declines therapy  2) CHTN> well controlled on norvasc 5mg   3) Dysuria> send urine cx  Meds:  Meds ordered this encounter  Medications  . sertraline (ZOLOFT) 50 MG tablet    Sig: Take 1 tablet (50 mg total) by mouth daily.    Dispense:  30 tablet    Refill:  6    Order Specific Question:   Supervising Provider  Answer:   Duane Lope H [[id number]]    Orders Placed This Encounter  Procedures  . Urine Culture  . POC Urinalysis Dipstick OB    Return in about 4 weeks (around 01/03/2020) for F/U, in person, with me.  Cheral Marker CNM, [facility] 12/06/2019 9:54 AM

## 2019-12-08 LAB — URINE CULTURE

## 2019-12-20 ENCOUNTER — Encounter: Payer: Self-pay | Admitting: Orthopaedic Surgery

## 2019-12-20 ENCOUNTER — Other Ambulatory Visit: Payer: Self-pay

## 2019-12-20 ENCOUNTER — Ambulatory Visit (INDEPENDENT_AMBULATORY_CARE_PROVIDER_SITE_OTHER): Payer: Medicaid Other | Admitting: Orthopaedic Surgery

## 2019-12-20 VITALS — BP 165/98 | HR 66 | Ht 66.0 in | Wt 305.0 lb

## 2019-12-20 DIAGNOSIS — M7061 Trochanteric bursitis, right hip: Secondary | ICD-10-CM | POA: Diagnosis not present

## 2019-12-20 DIAGNOSIS — Z6841 Body Mass Index (BMI) 40.0 and over, adult: Secondary | ICD-10-CM | POA: Diagnosis not present

## 2019-12-20 MED ORDER — MELOXICAM 15 MG PO TABS: 15.0000 mg | ORAL_TABLET | Freq: Every day | ORAL | 5 refills | Status: DC

## 2019-12-20 NOTE — Progress Notes (Signed)
Patient [name], female DOB:January 11, 1986, 34 y.o. TDV:[id number]  Chief Complaint  Patient presents with  . Hip Pain    Rt hip  . Knee Pain    Bilat    HPI  [name] is a 34 y.o. female who has trochanteric bursitis of the right hip.  She got good relief from the injection last time but the Naprosyn does not help.  I will give her Mobic.  I will not do an injection today.  She has some pain.  She has no new trauma.   Body mass index is 49.23 kg/m.  The patient meets the AMA guidelines for Morbid (severe) obesity with a BMI > 40.0 and I have recommended weight loss.   ROS  Review of Systems  Constitutional: Positive for activity change.  Musculoskeletal: Positive for arthralgias, gait problem and joint swelling.  All other systems reviewed and are negative.   All other systems reviewed and are negative.  The following is a summary of the past history medically, past history surgically, known current medicines, social history and family history.  This information is gathered electronically by the computer from prior information and documentation.  I review this each visit and have found including this information at this point in the chart is beneficial and informative.    Past Medical History:  Diagnosis Date  . Arthritis   . Kidney infection   . UTI (urinary tract infection)   . Vaginal Pap smear, abnormal     Past Surgical History:  Procedure Laterality Date  . 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 CESAREAN SECTION    . COLPOSCOPY      Family History  Problem Relation Age of Onset  . Cancer Other   . Diabetes Other   . Thyroid disease Mother   . Cancer Maternal Grandmother   . Asthma Maternal Grandmother   . Arthritis Maternal Grandmother   . Cancer Maternal Grandfather   . Diabetes Maternal Grandfather   . Thyroid disease Maternal Aunt     Social History Social History   Tobacco Use  . Smoking status: Light Tobacco  Smoker    Packs/day: 0.25    Years: 2.00    Pack years: 0.50    Types: Cigarettes  . Smokeless tobacco: Never Used  . Tobacco comment: "2-3 cigs per day"  Vaping Use  . Vaping Use: Never used  Substance Use Topics  . Alcohol use: No  . Drug use: No    No Known Allergies  Current Outpatient Medications  Medication Sig Dispense Refill  . amLODipine (NORVASC) 5 MG tablet Take 1 tablet (5 mg total) by mouth daily. 30 tablet 6  . sertraline (ZOLOFT) 50 MG tablet Take 1 tablet (50 mg total) by mouth daily. 30 tablet 6  . meloxicam (MOBIC) 15 MG tablet Take 1 tablet (15 mg total) by mouth daily. 30 tablet 5   No current facility-administered medications for this visit.     Physical Exam  Blood pressure (!) 165/98, pulse 66, height 5\' 6"  (1.676 m), weight (!) 305 lb (138.3 kg), last menstrual period 11/27/2019, unknown if currently breastfeeding.  Constitutional: overall normal hygiene, normal nutrition, well developed, normal grooming, normal body habitus. Assistive device:none  Musculoskeletal: gait and station Limp none, muscle tone and strength are normal, no tremors or atrophy is present.  .  Neurological: coordination overall normal.  Deep tendon reflex/nerve stretch intact.  Sensation normal.  Cranial nerves II-XII intact.  Skin:   Normal overall no scars, lesions, ulcers or rashes. No psoriasis.  Psychiatric: Alert and oriented x 3.  Recent memory intact, remote memory unclear.  Normal mood and affect. Well groomed.  Good eye contact.  Cardiovascular: overall no swelling, no varicosities, no edema bilaterally, normal temperatures of the legs and arms, no clubbing, cyanosis and good capillary refill.  Lymphatic: palpation is normal.  All other systems reviewed and are negative   The patient has been educated about the nature of the problem(s) and counseled on treatment options.  The patient appeared to understand what I have discussed and is in agreement with  it.  Encounter Diagnoses  Name Primary?  . Trochanteric bursitis, right hip Yes  . Body mass index 45.0-49.9, adult (HCC)   . Morbid obesity (HCC)     PLAN Call if any problems.  Precautions discussed. Changed to Mobic.  Return to clinic as needed.   Electronically Signed Darreld Mclean, MD 9/2/20219:26 AM '

## 2019-12-27 ENCOUNTER — Telehealth: Payer: Self-pay | Admitting: Orthopaedic Surgery

## 2019-12-27 NOTE — Telephone Encounter (Signed)
Patient called back and was requesting another hip injection and was pushed out a month since she was under the impression that she could have another injection. I see that you had started her on Meloxicam from the last visit. She is wanting an injection. Please advise.    She is wanting to know if you can refer her to a pain clinic as well. Please advise.

## 2019-12-30 NOTE — Telephone Encounter (Signed)
Get her appointment

## 2019-12-31 NOTE — Telephone Encounter (Signed)
Tried to call patient and VM is not set up.

## 2020-01-01 NOTE — Telephone Encounter (Signed)
I called the patient to see if she wanted to come in sooner and the VM is not set up. She has a follow up on 01/08/2020.

## 2020-01-03 ENCOUNTER — Ambulatory Visit: Payer: Medicaid Other | Admitting: Women's Health

## 2020-01-08 ENCOUNTER — Ambulatory Visit: Payer: Medicaid Other | Admitting: Orthopaedic Surgery

## 2020-01-10 ENCOUNTER — Encounter: Payer: Self-pay | Admitting: Orthopaedic Surgery

## 2020-01-10 ENCOUNTER — Ambulatory Visit: Payer: Medicaid Other | Admitting: Orthopaedic Surgery

## 2020-01-10 ENCOUNTER — Other Ambulatory Visit: Payer: Self-pay

## 2020-01-10 DIAGNOSIS — M7061 Trochanteric bursitis, right hip: Secondary | ICD-10-CM

## 2020-01-10 DIAGNOSIS — Z6841 Body Mass Index (BMI) 40.0 and over, adult: Secondary | ICD-10-CM

## 2020-01-10 NOTE — Patient Instructions (Signed)
Note for being late to work as she came here.

## 2020-01-10 NOTE — Progress Notes (Signed)
PROCEDURE NOTE:  The patient request injection, verbal consent was obtained.  The right trochanteric area of the hip was prepped appropriately after time out was performed.   Sterile technique was observed and injection of 1 cc of Depo-Medrol 40 mg with several cc's of plain xylocaine. Anesthesia was provided by ethyl chloride and a 20-gauge needle was used to inject the hip area. The injection was tolerated well.  A band aid dressing was applied.  The patient was advised to apply ice later today and tomorrow to the injection sight as needed.  See as needed.  Call if any problem.  Precautions discussed.   Electronically Signed Darreld Mclean, MD 9/23/20219:31 AM

## 2020-01-25 DIAGNOSIS — Z20828 Contact with and (suspected) exposure to other viral communicable diseases: Secondary | ICD-10-CM | POA: Diagnosis not present

## 2020-01-25 DIAGNOSIS — U071 COVID-19: Secondary | ICD-10-CM | POA: Diagnosis not present

## 2020-01-31 ENCOUNTER — Telehealth: Payer: Self-pay | Admitting: Orthopaedic Surgery

## 2020-01-31 DIAGNOSIS — Z20828 Contact with and (suspected) exposure to other viral communicable diseases: Secondary | ICD-10-CM | POA: Diagnosis not present

## 2020-01-31 DIAGNOSIS — U071 COVID-19: Secondary | ICD-10-CM | POA: Diagnosis not present

## 2020-01-31 NOTE — Telephone Encounter (Signed)
Called pt to inquire about the type of brace she was supposed to be getting. Pt states that she hadn't actually talked to Dr. Hilda Lias about braces for her knees and she forgot to bring it up during her last appointment. Informed pt that braces can be ordered or she can get them from facility but I wasn't sure how much her insurance would cover on them. Pt states she will call back with an answer if she still wants them.

## 2020-01-31 NOTE — Telephone Encounter (Signed)
Patient called to ask about the status of a custom brace that she understood was being ordered. States she forgot to ask Dr Hilda Lias at last visit, 01/10/20. Said a message had been given to nurse (?)  Please advise. Patient's cell# [phone] or her work# [phone].

## 2020-02-07 DIAGNOSIS — U071 COVID-19: Secondary | ICD-10-CM | POA: Diagnosis not present

## 2020-02-07 DIAGNOSIS — Z20828 Contact with and (suspected) exposure to other viral communicable diseases: Secondary | ICD-10-CM | POA: Diagnosis not present

## 2020-02-14 DIAGNOSIS — U071 COVID-19: Secondary | ICD-10-CM | POA: Diagnosis not present

## 2020-02-14 DIAGNOSIS — Z20828 Contact with and (suspected) exposure to other viral communicable diseases: Secondary | ICD-10-CM | POA: Diagnosis not present

## 2020-03-04 DIAGNOSIS — Z20828 Contact with and (suspected) exposure to other viral communicable diseases: Secondary | ICD-10-CM | POA: Diagnosis not present

## 2020-03-04 DIAGNOSIS — U071 COVID-19: Secondary | ICD-10-CM | POA: Diagnosis not present

## 2020-03-06 ENCOUNTER — Encounter: Payer: Self-pay | Admitting: Orthopaedic Surgery

## 2020-03-06 ENCOUNTER — Other Ambulatory Visit: Payer: Self-pay

## 2020-03-06 ENCOUNTER — Ambulatory Visit (INDEPENDENT_AMBULATORY_CARE_PROVIDER_SITE_OTHER): Payer: Medicaid Other | Admitting: Orthopaedic Surgery

## 2020-03-06 VITALS — BP 137/87 | HR 81 | Ht 66.0 in | Wt 305.0 lb

## 2020-03-06 DIAGNOSIS — G8929 Other chronic pain: Secondary | ICD-10-CM | POA: Diagnosis not present

## 2020-03-06 DIAGNOSIS — M25562 Pain in left knee: Secondary | ICD-10-CM

## 2020-03-06 DIAGNOSIS — M25561 Pain in right knee: Secondary | ICD-10-CM | POA: Diagnosis not present

## 2020-03-06 DIAGNOSIS — Z6841 Body Mass Index (BMI) 40.0 and over, adult: Secondary | ICD-10-CM

## 2020-03-06 MED ORDER — HYDROCODONE-ACETAMINOPHEN 5-325 MG PO TABS
ORAL_TABLET | ORAL | 0 refills | Status: DC
Start: 2020-03-06 — End: 2020-04-22

## 2020-03-06 NOTE — Progress Notes (Signed)
PROCEDURE NOTE:  The patient requests injections of the right knee , verbal consent was obtained.  The right knee was prepped appropriately after time out was performed.   Sterile technique was observed and injection of 1 cc of Depo-Medrol 40 mg with several cc's of plain xylocaine. Anesthesia was provided by ethyl chloride and a 20-gauge needle was used to inject the knee area. The injection was tolerated well.  A band aid dressing was applied.  The patient was advised to apply ice later today and tomorrow to the injection sight as needed.  PROCEDURE NOTE:  The patient requests injections of the left knee , verbal consent was obtained.  The left knee was prepped appropriately after time out was performed.   Sterile technique was observed and injection of 1 cc of Depo-Medrol 40 mg with several cc's of plain xylocaine. Anesthesia was provided by ethyl chloride and a 20-gauge needle was used to inject the knee area. The injection was tolerated well.  A band aid dressing was applied.  The patient was advised to apply ice later today and tomorrow to the injection sight as needed.  Return in two months.  I have reviewed the [place] Controlled Substance Reporting System web site prior to prescribing narcotic medicine for this patient.   Call if any problem.  Precautions discussed.   Electronically Signed Darreld Mclean, MD 11/18/20218:58 AM

## 2020-03-11 DIAGNOSIS — Z20828 Contact with and (suspected) exposure to other viral communicable diseases: Secondary | ICD-10-CM | POA: Diagnosis not present

## 2020-03-11 DIAGNOSIS — U071 COVID-19: Secondary | ICD-10-CM | POA: Diagnosis not present

## 2020-03-20 DIAGNOSIS — Z20828 Contact with and (suspected) exposure to other viral communicable diseases: Secondary | ICD-10-CM | POA: Diagnosis not present

## 2020-03-20 DIAGNOSIS — U071 COVID-19: Secondary | ICD-10-CM | POA: Diagnosis not present

## 2020-03-25 DIAGNOSIS — U071 COVID-19: Secondary | ICD-10-CM | POA: Diagnosis not present

## 2020-03-25 DIAGNOSIS — Z20828 Contact with and (suspected) exposure to other viral communicable diseases: Secondary | ICD-10-CM | POA: Diagnosis not present

## 2020-03-27 DIAGNOSIS — Z20828 Contact with and (suspected) exposure to other viral communicable diseases: Secondary | ICD-10-CM | POA: Diagnosis not present

## 2020-03-27 DIAGNOSIS — U071 COVID-19: Secondary | ICD-10-CM | POA: Diagnosis not present

## 2020-04-03 DIAGNOSIS — Z20828 Contact with and (suspected) exposure to other viral communicable diseases: Secondary | ICD-10-CM | POA: Diagnosis not present

## 2020-04-03 DIAGNOSIS — U071 COVID-19: Secondary | ICD-10-CM | POA: Diagnosis not present

## 2020-04-08 DIAGNOSIS — Z20828 Contact with and (suspected) exposure to other viral communicable diseases: Secondary | ICD-10-CM | POA: Diagnosis not present

## 2020-04-08 DIAGNOSIS — U071 COVID-19: Secondary | ICD-10-CM | POA: Diagnosis not present

## 2020-04-10 DIAGNOSIS — Z20828 Contact with and (suspected) exposure to other viral communicable diseases: Secondary | ICD-10-CM | POA: Diagnosis not present

## 2020-04-10 DIAGNOSIS — U071 COVID-19: Secondary | ICD-10-CM | POA: Diagnosis not present

## 2020-04-15 DIAGNOSIS — U071 COVID-19: Secondary | ICD-10-CM | POA: Diagnosis not present

## 2020-04-15 DIAGNOSIS — Z20828 Contact with and (suspected) exposure to other viral communicable diseases: Secondary | ICD-10-CM | POA: Diagnosis not present

## 2020-04-22 ENCOUNTER — Ambulatory Visit (INDEPENDENT_AMBULATORY_CARE_PROVIDER_SITE_OTHER): Payer: Medicaid Other | Admitting: Orthopaedic Surgery

## 2020-04-22 ENCOUNTER — Other Ambulatory Visit: Payer: Self-pay

## 2020-04-22 ENCOUNTER — Encounter: Payer: Self-pay | Admitting: Orthopaedic Surgery

## 2020-04-22 VITALS — Ht 66.0 in | Wt 305.0 lb

## 2020-04-22 DIAGNOSIS — G8929 Other chronic pain: Secondary | ICD-10-CM

## 2020-04-22 DIAGNOSIS — Z20828 Contact with and (suspected) exposure to other viral communicable diseases: Secondary | ICD-10-CM | POA: Diagnosis not present

## 2020-04-22 DIAGNOSIS — M25561 Pain in right knee: Secondary | ICD-10-CM

## 2020-04-22 DIAGNOSIS — U071 COVID-19: Secondary | ICD-10-CM | POA: Diagnosis not present

## 2020-04-22 DIAGNOSIS — Z6841 Body Mass Index (BMI) 40.0 and over, adult: Secondary | ICD-10-CM

## 2020-04-22 DIAGNOSIS — M25562 Pain in left knee: Secondary | ICD-10-CM

## 2020-04-22 MED ORDER — MELOXICAM 15 MG PO TABS
15.0000 mg | ORAL_TABLET | Freq: Every day | ORAL | 5 refills | Status: DC
Start: 2020-04-22 — End: 2020-07-15

## 2020-04-22 MED ORDER — HYDROCODONE-ACETAMINOPHEN 5-325 MG PO TABS: ORAL_TABLET | ORAL | 0 refills | Status: DC

## 2020-04-22 NOTE — Progress Notes (Signed)
PROCEDURE NOTE:  The patient requests injections of the left knee , verbal consent was obtained.  The left knee was prepped appropriately after time out was performed.   Sterile technique was observed and injection of 1 cc of Depo-Medrol 40 mg with several cc's of plain xylocaine. Anesthesia was provided by ethyl chloride and a 20-gauge needle was used to inject the knee area. The injection was tolerated well.  A band aid dressing was applied.  The patient was advised to apply ice later today and tomorrow to the injection sight as needed.  PROCEDURE NOTE:  The patient requests injections of the right knee , verbal consent was obtained.  The right knee was prepped appropriately after time out was performed.   Sterile technique was observed and injection of 1 cc of Depo-Medrol 40 mg with several cc's of plain xylocaine. Anesthesia was provided by ethyl chloride and a 20-gauge needle was used to inject the knee area. The injection was tolerated well.  A band aid dressing was applied.  The patient was advised to apply ice later today and tomorrow to the injection sight as needed.  Return in one month.  I have reviewed the [place] Controlled Substance Reporting System web site prior to prescribing narcotic medicine for this patient.   I refilled her Mobic today also.  Electronically Signed Darreld Mclean, MD 1/4/20223:43 PM

## 2020-04-29 DIAGNOSIS — U071 COVID-19: Secondary | ICD-10-CM | POA: Diagnosis not present

## 2020-04-29 DIAGNOSIS — Z20828 Contact with and (suspected) exposure to other viral communicable diseases: Secondary | ICD-10-CM | POA: Diagnosis not present

## 2020-05-01 DIAGNOSIS — Z20828 Contact with and (suspected) exposure to other viral communicable diseases: Secondary | ICD-10-CM | POA: Diagnosis not present

## 2020-05-01 DIAGNOSIS — U071 COVID-19: Secondary | ICD-10-CM | POA: Diagnosis not present

## 2020-05-02 DIAGNOSIS — N390 Urinary tract infection, site not specified: Secondary | ICD-10-CM | POA: Diagnosis not present

## 2020-05-06 ENCOUNTER — Ambulatory Visit: Payer: Medicaid Other | Admitting: Orthopaedic Surgery

## 2020-05-08 DIAGNOSIS — U071 COVID-19: Secondary | ICD-10-CM | POA: Diagnosis not present

## 2020-05-15 DIAGNOSIS — U071 COVID-19: Secondary | ICD-10-CM | POA: Diagnosis not present

## 2020-05-20 ENCOUNTER — Ambulatory Visit: Payer: Medicaid Other | Admitting: Orthopaedic Surgery

## 2020-05-28 DIAGNOSIS — Z87440 Personal history of urinary (tract) infections: Secondary | ICD-10-CM | POA: Diagnosis not present

## 2020-05-29 ENCOUNTER — Ambulatory Visit: Payer: Medicaid Other | Admitting: Orthopaedic Surgery

## 2020-06-17 ENCOUNTER — Ambulatory Visit (INDEPENDENT_AMBULATORY_CARE_PROVIDER_SITE_OTHER): Payer: Medicaid Other | Admitting: Orthopaedic Surgery

## 2020-06-17 ENCOUNTER — Other Ambulatory Visit: Payer: Self-pay

## 2020-06-17 ENCOUNTER — Encounter: Payer: Self-pay | Admitting: Orthopaedic Surgery

## 2020-06-17 DIAGNOSIS — M25562 Pain in left knee: Secondary | ICD-10-CM | POA: Diagnosis not present

## 2020-06-17 DIAGNOSIS — Z6841 Body Mass Index (BMI) 40.0 and over, adult: Secondary | ICD-10-CM

## 2020-06-17 DIAGNOSIS — F17210 Nicotine dependence, cigarettes, uncomplicated: Secondary | ICD-10-CM

## 2020-06-17 DIAGNOSIS — G8929 Other chronic pain: Secondary | ICD-10-CM | POA: Diagnosis not present

## 2020-06-17 DIAGNOSIS — M25561 Pain in right knee: Secondary | ICD-10-CM | POA: Diagnosis not present

## 2020-06-17 MED ORDER — HYDROCODONE-ACETAMINOPHEN 5-325 MG PO TABS: ORAL_TABLET | ORAL | 0 refills | Status: DC

## 2020-06-17 NOTE — Patient Instructions (Signed)

## 2020-06-17 NOTE — Progress Notes (Signed)
PROCEDURE NOTE:  The patient requests injections of the left knee , verbal consent was obtained.  The left knee was prepped appropriately after time out was performed.   Sterile technique was observed and injection of 1 cc of Celestone 6 mg with several cc's of plain xylocaine. Anesthesia was provided by ethyl chloride and a 20-gauge needle was used to inject the knee area. The injection was tolerated well.  A band aid dressing was applied.  The patient was advised to apply ice later today and tomorrow to the injection sight as needed.  PROCEDURE NOTE:  The patient requests injections of the right knee , verbal consent was obtained.  The right knee was prepped appropriately after time out was performed.   Sterile technique was observed and injection of 1 cc of Celestone 6 mg with several cc's of plain xylocaine. Anesthesia was provided by ethyl chloride and a 20-gauge needle was used to inject the knee area. The injection was tolerated well.  A band aid dressing was applied.  The patient was advised to apply ice later today and tomorrow to the injection sight as needed.  I have reviewed the [place] Controlled Substance Reporting System web site prior to prescribing narcotic medicine for this patient.   Return in one month.  Electronically Signed Darreld Mclean, MD 3/1/20228:24 AM

## 2020-07-01 ENCOUNTER — Encounter: Payer: Self-pay | Admitting: Orthopaedic Surgery

## 2020-07-01 ENCOUNTER — Other Ambulatory Visit: Payer: Self-pay

## 2020-07-01 ENCOUNTER — Ambulatory Visit (INDEPENDENT_AMBULATORY_CARE_PROVIDER_SITE_OTHER): Payer: Medicaid Other | Admitting: Orthopaedic Surgery

## 2020-07-01 DIAGNOSIS — M7061 Trochanteric bursitis, right hip: Secondary | ICD-10-CM | POA: Diagnosis not present

## 2020-07-01 DIAGNOSIS — F17210 Nicotine dependence, cigarettes, uncomplicated: Secondary | ICD-10-CM

## 2020-07-01 DIAGNOSIS — Z6841 Body Mass Index (BMI) 40.0 and over, adult: Secondary | ICD-10-CM

## 2020-07-01 MED ORDER — HYDROCODONE-ACETAMINOPHEN 5-325 MG PO TABS
ORAL_TABLET | ORAL | 0 refills | Status: DC
Start: 2020-07-01 — End: 2020-07-15

## 2020-07-01 NOTE — Patient Instructions (Signed)
Steps to Quit Smoking Smoking tobacco is the leading cause of preventable death. It can affect almost every organ in the body. Smoking puts you and people around you at risk for many serious, long-lasting (chronic) diseases. Quitting smoking can be hard, but it is one of the best things that you can do for your health. It is never too late to quit. How do I get ready to quit? When you decide to quit smoking, make a plan to help you succeed. Before you quit:  Pick a date to quit. Set a date within the next 2 weeks to give you time to prepare.  Write down the reasons why you are quitting. Keep this list in places where you will see it often.  Tell your family, friends, and co-workers that you are quitting. Their support is important.  Talk with your doctor about the choices that may help you quit.  Find out if your health insurance will pay for these treatments.  Know the people, places, things, and activities that make you want to smoke (triggers). Avoid them. What first steps can I take to quit smoking?  Throw away all cigarettes at home, at work, and in your car.  Throw away the things that you use when you smoke, such as ashtrays and lighters.  Clean your car. Make sure to empty the ashtray.  Clean your home, including curtains and carpets. What can I do to help me quit smoking? Talk with your doctor about taking medicines and seeing a counselor at the same time. You are more likely to succeed when you do both.  If you are pregnant or breastfeeding, talk with your doctor about counseling or other ways to quit smoking. Do not take medicine to help you quit smoking unless your doctor tells you to do so. To quit smoking: Quit right away  Quit smoking totally, instead of slowly cutting back on how much you smoke over a period of time.  Go to counseling. You are more likely to quit if you go to counseling sessions regularly. Take medicine You may take medicines to help you quit. Some  medicines need a prescription, and some you can buy over-the-counter. Some medicines may contain a drug called nicotine to replace the nicotine in cigarettes. Medicines may:  Help you to stop having the desire to smoke (cravings).  Help to stop the problems that come when you stop smoking (withdrawal symptoms). Your doctor may ask you to use:  Nicotine patches, gum, or lozenges.  Nicotine inhalers or sprays.  Non-nicotine medicine that is taken by mouth. Find resources Find resources and other ways to help you quit smoking and remain smoke-free after you quit. These resources are most helpful when you use them often. They include:  Online chats with a counselor.  Phone quitlines.  Printed self-help materials.  Support groups or group counseling.  Text messaging programs.  Mobile phone apps. Use apps on your mobile phone or tablet that can help you stick to your quit plan. There are many free apps for mobile phones and tablets as well as websites. Examples include Quit Guide from the CDC and smokefree.gov   What things can I do to make it easier to quit?  Talk to your family and friends. Ask them to support and encourage you.  Call a phone quitline (1-800-QUIT-NOW), reach out to support groups, or work with a counselor.  Ask people who smoke to not smoke around you.  Avoid places that make you want to smoke,   such as: ? Bars. ? Parties. ? Smoke-break areas at work.  Spend time with people who do not smoke.  Lower the stress in your life. Stress can make you want to smoke. Try these things to help your stress: ? Getting regular exercise. ? Doing deep-breathing exercises. ? Doing yoga. ? Meditating. ? Doing a body scan. To do this, close your eyes, focus on one area of your body at a time from head to toe. Notice which parts of your body are tense. Try to relax the muscles in those areas.   How will I feel when I quit smoking? Day 1 to 3 weeks Within the first 24 hours,  you may start to have some problems that come from quitting tobacco. These problems are very bad 2-3 days after you quit, but they do not often last for more than 2-3 weeks. You may get these symptoms:  Mood swings.  Feeling restless, nervous, angry, or annoyed.  Trouble concentrating.  Dizziness.  Strong desire for high-sugar foods and nicotine.  Weight gain.  Trouble pooping (constipation).  Feeling like you may vomit (nausea).  Coughing or a sore throat.  Changes in how the medicines that you take for other issues work in your body.  Depression.  Trouble sleeping (insomnia). Week 3 and afterward After the first 2-3 weeks of quitting, you may start to notice more positive results, such as:  Better sense of smell and taste.  Less coughing and sore throat.  Slower heart rate.  Lower blood pressure.  Clearer skin.  Better breathing.  Fewer sick days. Quitting smoking can be hard. Do not give up if you fail the first time. Some people need to try a few times before they succeed. Do your best to stick to your quit plan, and talk with your doctor if you have any questions or concerns. Summary  Smoking tobacco is the leading cause of preventable death. Quitting smoking can be hard, but it is one of the best things that you can do for your health.  When you decide to quit smoking, make a plan to help you succeed.  Quit smoking right away, not slowly over a period of time.  When you start quitting, seek help from your doctor, family, or friends. This information is not intended to replace advice given to you by your health care provider. Make sure you discuss any questions you have with your health care provider. Document Revised: 12/29/2018 Document Reviewed: 06/24/2018 Elsevier Patient Education  2021 [employer]vier Inc.  

## 2020-07-01 NOTE — Progress Notes (Signed)
PROCEDURE NOTE:  The patient request injection, verbal consent was obtained.  The right trochanteric area of the hip was prepped appropriately after time out was performed.   Sterile technique was observed and injection of 1 cc of Celestone 6 mg with several cc's of plain xylocaine. Anesthesia was provided by ethyl chloride and a 20-gauge needle was used to inject the hip area. The injection was tolerated well.  A band aid dressing was applied.  The patient was advised to apply ice later today and tomorrow to the injection sight as needed.  I have reviewed the [place] Controlled Substance Reporting System web site prior to prescribing narcotic medicine for this patient.   Return in one month.  Call if any problem.  Precautions discussed.   Electronically Signed Darreld Mclean, MD 3/15/20228:58 AM

## 2020-07-15 ENCOUNTER — Ambulatory Visit: Payer: Medicaid Other | Admitting: Orthopaedic Surgery

## 2020-07-15 ENCOUNTER — Encounter: Payer: Self-pay | Admitting: Orthopaedic Surgery

## 2020-07-15 ENCOUNTER — Other Ambulatory Visit: Payer: Self-pay

## 2020-07-15 DIAGNOSIS — G8929 Other chronic pain: Secondary | ICD-10-CM | POA: Diagnosis not present

## 2020-07-15 DIAGNOSIS — M25562 Pain in left knee: Secondary | ICD-10-CM

## 2020-07-15 DIAGNOSIS — M25561 Pain in right knee: Secondary | ICD-10-CM | POA: Diagnosis not present

## 2020-07-15 DIAGNOSIS — F17210 Nicotine dependence, cigarettes, uncomplicated: Secondary | ICD-10-CM

## 2020-07-15 MED ORDER — HYDROCODONE-ACETAMINOPHEN 5-325 MG PO TABS: ORAL_TABLET | ORAL | 0 refills | Status: DC

## 2020-07-15 MED ORDER — MELOXICAM 15 MG PO TABS
15.0000 mg | ORAL_TABLET | Freq: Every day | ORAL | 5 refills | Status: DC
Start: 2020-07-15 — End: 2020-08-12

## 2020-07-15 NOTE — Patient Instructions (Signed)
Steps to Quit Smoking Smoking tobacco is the leading cause of preventable death. It can affect almost every organ in the body. Smoking puts you and people around you at risk for many serious, long-lasting (chronic) diseases. Quitting smoking can be hard, but it is one of the best things that you can do for your health. It is never too late to quit. How do I get ready to quit? When you decide to quit smoking, make a plan to help you succeed. Before you quit:  Pick a date to quit. Set a date within the next 2 weeks to give you time to prepare.  Write down the reasons why you are quitting. Keep this list in places where you will see it often.  Tell your family, friends, and co-workers that you are quitting. Their support is important.  Talk with your doctor about the choices that may help you quit.  Find out if your health insurance will pay for these treatments.  Know the people, places, things, and activities that make you want to smoke (triggers). Avoid them. What first steps can I take to quit smoking?  Throw away all cigarettes at home, at work, and in your car.  Throw away the things that you use when you smoke, such as ashtrays and lighters.  Clean your car. Make sure to empty the ashtray.  Clean your home, including curtains and carpets. What can I do to help me quit smoking? Talk with your doctor about taking medicines and seeing a counselor at the same time. You are more likely to succeed when you do both.  If you are pregnant or breastfeeding, talk with your doctor about counseling or other ways to quit smoking. Do not take medicine to help you quit smoking unless your doctor tells you to do so. To quit smoking: Quit right away  Quit smoking totally, instead of slowly cutting back on how much you smoke over a period of time.  Go to counseling. You are more likely to quit if you go to counseling sessions regularly. Take medicine You may take medicines to help you quit. Some  medicines need a prescription, and some you can buy over-the-counter. Some medicines may contain a drug called nicotine to replace the nicotine in cigarettes. Medicines may:  Help you to stop having the desire to smoke (cravings).  Help to stop the problems that come when you stop smoking (withdrawal symptoms). Your doctor may ask you to use:  Nicotine patches, gum, or lozenges.  Nicotine inhalers or sprays.  Non-nicotine medicine that is taken by mouth. Find resources Find resources and other ways to help you quit smoking and remain smoke-free after you quit. These resources are most helpful when you use them often. They include:  Online chats with a counselor.  Phone quitlines.  Printed self-help materials.  Support groups or group counseling.  Text messaging programs.  Mobile phone apps. Use apps on your mobile phone or tablet that can help you stick to your quit plan. There are many free apps for mobile phones and tablets as well as websites. Examples include Quit Guide from the CDC and smokefree.gov   What things can I do to make it easier to quit?  Talk to your family and friends. Ask them to support and encourage you.  Call a phone quitline (1-800-QUIT-NOW), reach out to support groups, or work with a counselor.  Ask people who smoke to not smoke around you.  Avoid places that make you want to smoke,   such as: ? Bars. ? Parties. ? Smoke-break areas at work.  Spend time with people who do not smoke.  Lower the stress in your life. Stress can make you want to smoke. Try these things to help your stress: ? Getting regular exercise. ? Doing deep-breathing exercises. ? Doing yoga. ? Meditating. ? Doing a body scan. To do this, close your eyes, focus on one area of your body at a time from head to toe. Notice which parts of your body are tense. Try to relax the muscles in those areas.   How will I feel when I quit smoking? Day 1 to 3 weeks Within the first 24 hours,  you may start to have some problems that come from quitting tobacco. These problems are very bad 2-3 days after you quit, but they do not often last for more than 2-3 weeks. You may get these symptoms:  Mood swings.  Feeling restless, nervous, angry, or annoyed.  Trouble concentrating.  Dizziness.  Strong desire for high-sugar foods and nicotine.  Weight gain.  Trouble pooping (constipation).  Feeling like you may vomit (nausea).  Coughing or a sore throat.  Changes in how the medicines that you take for other issues work in your body.  Depression.  Trouble sleeping (insomnia). Week 3 and afterward After the first 2-3 weeks of quitting, you may start to notice more positive results, such as:  Better sense of smell and taste.  Less coughing and sore throat.  Slower heart rate.  Lower blood pressure.  Clearer skin.  Better breathing.  Fewer sick days. Quitting smoking can be hard. Do not give up if you fail the first time. Some people need to try a few times before they succeed. Do your best to stick to your quit plan, and talk with your doctor if you have any questions or concerns. Summary  Smoking tobacco is the leading cause of preventable death. Quitting smoking can be hard, but it is one of the best things that you can do for your health.  When you decide to quit smoking, make a plan to help you succeed.  Quit smoking right away, not slowly over a period of time.  When you start quitting, seek help from your doctor, family, or friends. This information is not intended to replace advice given to you by your health care provider. Make sure you discuss any questions you have with your health care provider. Document Revised: 12/29/2018 Document Reviewed: 06/24/2018 Elsevier Patient Education  2021 [employer]vier Inc.  

## 2020-07-15 NOTE — Progress Notes (Signed)
PROCEDURE NOTE:  The patient requests injections of the left knee , verbal consent was obtained.  The left knee was prepped appropriately after time out was performed.   Sterile technique was observed and injection of 1 cc of Celestone 6 mg with several cc's of plain xylocaine. Anesthesia was provided by ethyl chloride and a 20-gauge needle was used to inject the knee area. The injection was tolerated well.  A band aid dressing was applied.  The patient was advised to apply ice later today and tomorrow to the injection sight as needed.  PROCEDURE NOTE:  The patient requests injections of the right knee , verbal consent was obtained.  The right knee was prepped appropriately after time out was performed.   Sterile technique was observed and injection of 1 cc of Celestone 6 mg with several cc's of plain xylocaine. Anesthesia was provided by ethyl chloride and a 20-gauge needle was used to inject the knee area. The injection was tolerated well.  A band aid dressing was applied.  The patient was advised to apply ice later today and tomorrow to the injection sight as needed.  I have reviewed the [place] Controlled Substance Reporting System web site prior to prescribing narcotic medicine for this patient.   Call if any problem.  Precautions discussed.   Return in one month.  Electronically Signed Darreld Mclean, MD 3/29/20229:37 AM

## 2020-07-29 ENCOUNTER — Ambulatory Visit: Payer: Medicaid Other | Admitting: Orthopaedic Surgery

## 2020-07-31 ENCOUNTER — Other Ambulatory Visit: Payer: Self-pay

## 2020-07-31 ENCOUNTER — Ambulatory Visit (INDEPENDENT_AMBULATORY_CARE_PROVIDER_SITE_OTHER): Payer: Medicaid Other | Admitting: Orthopaedic Surgery

## 2020-07-31 ENCOUNTER — Encounter: Payer: Self-pay | Admitting: Orthopaedic Surgery

## 2020-07-31 DIAGNOSIS — Z6841 Body Mass Index (BMI) 40.0 and over, adult: Secondary | ICD-10-CM

## 2020-07-31 DIAGNOSIS — M7061 Trochanteric bursitis, right hip: Secondary | ICD-10-CM

## 2020-07-31 MED ORDER — HYDROCODONE-ACETAMINOPHEN 5-325 MG PO TABS: ORAL_TABLET | ORAL | 0 refills | Status: DC

## 2020-07-31 NOTE — Progress Notes (Signed)
PROCEDURE NOTE:  The patient request injection, verbal consent was obtained.  The right trochanteric area of the hip was prepped appropriately after time out was performed.   Sterile technique was observed and injection of 1 cc of Celestone 6 mg with several cc's of plain xylocaine. Anesthesia was provided by ethyl chloride and a 20-gauge needle was used to inject the hip area. The injection was tolerated well.  A band aid dressing was applied.  The patient was advised to apply ice later today and tomorrow to the injection sight as needed.  See as needed.  I have reviewed the [place] Controlled Substance Reporting System web site prior to prescribing narcotic medicine for this patient.   Electronically Signed Darreld Mclean, MD 4/14/202210:09 AM

## 2020-08-07 DIAGNOSIS — M791 Myalgia, unspecified site: Secondary | ICD-10-CM | POA: Diagnosis not present

## 2020-08-07 DIAGNOSIS — I10 Essential (primary) hypertension: Secondary | ICD-10-CM | POA: Diagnosis not present

## 2020-08-07 DIAGNOSIS — M549 Dorsalgia, unspecified: Secondary | ICD-10-CM | POA: Diagnosis not present

## 2020-08-12 ENCOUNTER — Other Ambulatory Visit: Payer: Self-pay

## 2020-08-12 ENCOUNTER — Ambulatory Visit (INDEPENDENT_AMBULATORY_CARE_PROVIDER_SITE_OTHER): Payer: Medicaid Other | Admitting: Orthopaedic Surgery

## 2020-08-12 ENCOUNTER — Encounter: Payer: Self-pay | Admitting: Orthopaedic Surgery

## 2020-08-12 DIAGNOSIS — G8929 Other chronic pain: Secondary | ICD-10-CM | POA: Diagnosis not present

## 2020-08-12 DIAGNOSIS — M25562 Pain in left knee: Secondary | ICD-10-CM

## 2020-08-12 DIAGNOSIS — M25561 Pain in right knee: Secondary | ICD-10-CM | POA: Diagnosis not present

## 2020-08-12 DIAGNOSIS — Z6841 Body Mass Index (BMI) 40.0 and over, adult: Secondary | ICD-10-CM

## 2020-08-12 DIAGNOSIS — F17210 Nicotine dependence, cigarettes, uncomplicated: Secondary | ICD-10-CM

## 2020-08-12 MED ORDER — MELOXICAM 15 MG PO TABS
15.0000 mg | ORAL_TABLET | Freq: Every day | ORAL | 5 refills | Status: DC
Start: 2020-08-12 — End: 2021-02-23

## 2020-08-12 NOTE — Progress Notes (Signed)
PROCEDURE NOTE:  The patient requests injections of the left knee , verbal consent was obtained.  The left knee was prepped appropriately after time out was performed.   Sterile technique was observed and injection of 1 cc of Celestone 6 mg with several cc's of plain xylocaine. Anesthesia was provided by ethyl chloride and a 20-gauge needle was used to inject the knee area. The injection was tolerated well.  A band aid dressing was applied.  The patient was advised to apply ice later today and tomorrow to the injection sight as needed.  PROCEDURE NOTE:  The patient requests injections of the right knee , verbal consent was obtained.  The right knee was prepped appropriately after time out was performed.   Sterile technique was observed and injection of 1 cc of Celestone 6 mg with several cc's of plain xylocaine. Anesthesia was provided by ethyl chloride and a 20-gauge needle was used to inject the knee area. The injection was tolerated well.  A band aid dressing was applied.  The patient was advised to apply ice later today and tomorrow to the injection sight as needed.  See as needed.  Meloxicam refilled.  Call if any problem.  Precautions discussed.   Electronically Signed Darreld Mclean, MD 4/26/20228:25 AM

## 2020-08-12 NOTE — Patient Instructions (Signed)
Steps to Quit Smoking Smoking tobacco is the leading cause of preventable death. It can affect almost every organ in the body. Smoking puts you and people around you at risk for many serious, long-lasting (chronic) diseases. Quitting smoking can be hard, but it is one of the best things that you can do for your health. It is never too late to quit. How do I get ready to quit? When you decide to quit smoking, make a plan to help you succeed. Before you quit:  Pick a date to quit. Set a date within the next 2 weeks to give you time to prepare.  Write down the reasons why you are quitting. Keep this list in places where you will see it often.  Tell your family, friends, and co-workers that you are quitting. Their support is important.  Talk with your doctor about the choices that may help you quit.  Find out if your health insurance will pay for these treatments.  Know the people, places, things, and activities that make you want to smoke (triggers). Avoid them. What first steps can I take to quit smoking?  Throw away all cigarettes at home, at work, and in your car.  Throw away the things that you use when you smoke, such as ashtrays and lighters.  Clean your car. Make sure to empty the ashtray.  Clean your home, including curtains and carpets. What can I do to help me quit smoking? Talk with your doctor about taking medicines and seeing a counselor at the same time. You are more likely to succeed when you do both.  If you are pregnant or breastfeeding, talk with your doctor about counseling or other ways to quit smoking. Do not take medicine to help you quit smoking unless your doctor tells you to do so. To quit smoking: Quit right away  Quit smoking totally, instead of slowly cutting back on how much you smoke over a period of time.  Go to counseling. You are more likely to quit if you go to counseling sessions regularly. Take medicine You may take medicines to help you quit. Some  medicines need a prescription, and some you can buy over-the-counter. Some medicines may contain a drug called nicotine to replace the nicotine in cigarettes. Medicines may:  Help you to stop having the desire to smoke (cravings).  Help to stop the problems that come when you stop smoking (withdrawal symptoms). Your doctor may ask you to use:  Nicotine patches, gum, or lozenges.  Nicotine inhalers or sprays.  Non-nicotine medicine that is taken by mouth. Find resources Find resources and other ways to help you quit smoking and remain smoke-free after you quit. These resources are most helpful when you use them often. They include:  Online chats with a counselor.  Phone quitlines.  Printed self-help materials.  Support groups or group counseling.  Text messaging programs.  Mobile phone apps. Use apps on your mobile phone or tablet that can help you stick to your quit plan. There are many free apps for mobile phones and tablets as well as websites. Examples include Quit Guide from the CDC and smokefree.gov   What things can I do to make it easier to quit?  Talk to your family and friends. Ask them to support and encourage you.  Call a phone quitline (1-800-QUIT-NOW), reach out to support groups, or work with a counselor.  Ask people who smoke to not smoke around you.  Avoid places that make you want to smoke,   such as: ? Bars. ? Parties. ? Smoke-break areas at work.  Spend time with people who do not smoke.  Lower the stress in your life. Stress can make you want to smoke. Try these things to help your stress: ? Getting regular exercise. ? Doing deep-breathing exercises. ? Doing yoga. ? Meditating. ? Doing a body scan. To do this, close your eyes, focus on one area of your body at a time from head to toe. Notice which parts of your body are tense. Try to relax the muscles in those areas.   How will I feel when I quit smoking? Day 1 to 3 weeks Within the first 24 hours,  you may start to have some problems that come from quitting tobacco. These problems are very bad 2-3 days after you quit, but they do not often last for more than 2-3 weeks. You may get these symptoms:  Mood swings.  Feeling restless, nervous, angry, or annoyed.  Trouble concentrating.  Dizziness.  Strong desire for high-sugar foods and nicotine.  Weight gain.  Trouble pooping (constipation).  Feeling like you may vomit (nausea).  Coughing or a sore throat.  Changes in how the medicines that you take for other issues work in your body.  Depression.  Trouble sleeping (insomnia). Week 3 and afterward After the first 2-3 weeks of quitting, you may start to notice more positive results, such as:  Better sense of smell and taste.  Less coughing and sore throat.  Slower heart rate.  Lower blood pressure.  Clearer skin.  Better breathing.  Fewer sick days. Quitting smoking can be hard. Do not give up if you fail the first time. Some people need to try a few times before they succeed. Do your best to stick to your quit plan, and talk with your doctor if you have any questions or concerns. Summary  Smoking tobacco is the leading cause of preventable death. Quitting smoking can be hard, but it is one of the best things that you can do for your health.  When you decide to quit smoking, make a plan to help you succeed.  Quit smoking right away, not slowly over a period of time.  When you start quitting, seek help from your doctor, family, or friends. This information is not intended to replace advice given to you by your health care provider. Make sure you discuss any questions you have with your health care provider. Document Revised: 12/29/2018 Document Reviewed: 06/24/2018 Elsevier Patient Education  2021 [employer]vier Inc.  

## 2020-08-20 DIAGNOSIS — Z6841 Body Mass Index (BMI) 40.0 and over, adult: Secondary | ICD-10-CM | POA: Diagnosis not present

## 2020-08-20 DIAGNOSIS — M129 Arthropathy, unspecified: Secondary | ICD-10-CM | POA: Diagnosis not present

## 2020-08-20 DIAGNOSIS — L918 Other hypertrophic disorders of the skin: Secondary | ICD-10-CM | POA: Diagnosis not present

## 2020-08-20 DIAGNOSIS — I10 Essential (primary) hypertension: Secondary | ICD-10-CM | POA: Diagnosis not present

## 2020-08-22 ENCOUNTER — Ambulatory Visit: Payer: Self-pay | Admitting: "Endocrinology

## 2020-08-25 ENCOUNTER — Encounter: Payer: Self-pay | Admitting: "Endocrinology

## 2020-08-25 ENCOUNTER — Ambulatory Visit (INDEPENDENT_AMBULATORY_CARE_PROVIDER_SITE_OTHER): Payer: Medicaid Other | Admitting: "Endocrinology

## 2020-08-25 ENCOUNTER — Other Ambulatory Visit: Payer: Self-pay

## 2020-08-25 DIAGNOSIS — F17200 Nicotine dependence, unspecified, uncomplicated: Secondary | ICD-10-CM | POA: Diagnosis not present

## 2020-08-25 DIAGNOSIS — I10 Essential (primary) hypertension: Secondary | ICD-10-CM

## 2020-08-25 DIAGNOSIS — R7303 Prediabetes: Secondary | ICD-10-CM | POA: Diagnosis not present

## 2020-08-25 LAB — POCT GLYCOSYLATED HEMOGLOBIN (HGB A1C): HbA1c, POC (prediabetic range): 5.7 % (ref 5.7–6.4)

## 2020-08-25 MED ORDER — METFORMIN HCL ER 500 MG PO TB24: 500.0000 mg | ORAL_TABLET | Freq: Every day | ORAL | 1 refills | Status: DC

## 2020-08-25 NOTE — Patient Instructions (Signed)

## 2020-08-25 NOTE — Progress Notes (Signed)
Endocrinology Consult Note                                            08/25/2020, 8:45 PM   Subjective:    Patient ID: [name], female    DOB: [birth year], PCP Avon Gully, MD   Past Medical History:  Diagnosis Date  . Arthritis   . Hypertension   . Kidney infection   . UTI (urinary tract infection)   . Vaginal Pap smear, abnormal    Past Surgical History:  Procedure Laterality Date  . 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 CESAREAN SECTION    . COLPOSCOPY     Social History   Socioeconomic History  . Marital status: Single    Spouse name: Not on file  . Number of children: 3  . Years of education: Not on file  . Highest education level: Not on file  Occupational History  . Not on file  Tobacco Use  . Smoking status: Light Tobacco Smoker    Packs/day: 0.25    Years: 2.00    Pack years: 0.50    Types: Cigarettes  . Smokeless tobacco: Never Used  . Tobacco comment: "2-3 cigs per day"  Vaping Use  . Vaping Use: Never used  Substance and Sexual Activity  . Alcohol use: No  . Drug use: No  . Sexual activity: Yes    Birth control/protection: None  Other Topics Concern  . Not on file  Social History Narrative  . Not on file   Social Determinants of Health   Financial Resource Strain: Low Risk   . Difficulty of Paying Living Expenses: Not very hard  Food Insecurity: No Food Insecurity  . Worried About Programme researcher, broadcasting/film/video in the Last Year: Never true  . Ran Out of Food in the Last Year: Never true  Transportation Needs: No Transportation Needs  . Lack of Transportation (Medical): No  . Lack of Transportation (Non-Medical): No  Physical Activity: Insufficiently Active  . Days of Exercise per Week: 2 days  . Minutes of Exercise per Session: 10 min  Stress: No Stress Concern Present  . Feeling of Stress : Only a little  Social Connections: Socially Isolated  . Frequency of Communication with Friends and Family: Once a  week  . Frequency of Social Gatherings with Friends and Family: Once a week  . Attends Religious Services: 1 to 4 times per year  . Active Member of Clubs or Organizations: No  . Attends Banker Meetings: Never  . Marital Status: Never married   Family History  Problem Relation Age of Onset  . Cancer Other   . Diabetes Other   . Thyroid disease Mother   . Hypertension Mother   . Heart attack Mother   . Cancer Maternal Grandmother   . Asthma Maternal Grandmother   . Arthritis Maternal Grandmother   . Cancer Maternal Grandfather   . Diabetes Maternal Grandfather   . Thyroid disease Maternal Aunt    Outpatient Encounter Medications as of 08/25/2020  Medication Sig  . acetaminophen (TYLENOL) 325 MG tablet Take 650 mg by mouth every 6 (six) hours as needed.  . metFORMIN (GLUCOPHAGE XR) 500 MG 24 hr tablet Take 1 tablet (500 mg total) by mouth daily with breakfast.  . hydrochlorothiazide (HYDRODIURIL)  12.5 MG tablet Take 12.5 mg by mouth daily.  Marland Kitchen. HYDROcodone-acetaminophen (NORCO/VICODIN) 5-325 MG tablet One tablet every six hours for pain.  Limit 7 days.  . meloxicam (MOBIC) 15 MG tablet Take 1 tablet (15 mg total) by mouth daily.  . [DISCONTINUED] amLODipine (NORVASC) 10 MG tablet Take 5 mg by mouth daily. (Patient not taking: No sig reported)  . [DISCONTINUED] sertraline (ZOLOFT) 50 MG tablet Take 1 tablet (50 mg total) by mouth daily. (Patient not taking: No sig reported)   No facility-administered encounter medications on file as of 08/25/2020.   ALLERGIES: Allergies  Allergen Reactions  . Bee Venom Swelling    VACCINATION STATUS: Immunization History  Administered Date(s) Administered  . Tdap 08/03/2019    HPI [name] is 35 y.o. female who presents today with a medical history as above. she is being seen in consultation for weight management/prediabetes requested by Avon GullyFanta, Tesfaye, MD.  History is obtained directly from the patient and chart review.  She  has dealt with heavy weight all of her adult life, weight 300+ during high school.  With some lifestyle modification couple years ago she lost 50 pounds, however most recently gained it back.  She denies any previous history of diabetes however has had prediabetes in 2021.  She has hypertension on treatment.  She has family history of diabetes type 2.  She denies endocrine dysfunction such as hypothyroidism or Cushing syndrome.  She admits to dietary indiscretions including consumption of sweetened beverages and sweet desserts.  She is active smoker.  She was able to get pregnant 4 times, has 3 live children.   She denies any major coronary artery disease diagnosed.  She underwent bladder reconstructive surgery  and cesarean sections.  Review of Systems  Constitutional: + Recent weight gain, + fatigue, no subjective hyperthermia, no subjective hypothermia Eyes: no blurry vision, no xerophthalmia ENT: no sore throat, no nodules palpated in throat, no dysphagia/odynophagia, no hoarseness Cardiovascular: no Chest Pain, no Shortness of Breath, no palpitations, no leg swelling Respiratory: no cough, no shortness of breath Gastrointestinal: no Nausea/Vomiting/Diarhhea Musculoskeletal: no muscle/joint aches Skin: no rashes, + denies any skin color change. Neurological: no tremors, no numbness, no tingling, no dizziness Psychiatric: no depression, no anxiety  Objective:    Vitals with BMI 08/25/2020 04/22/2020 03/06/2020  Height 5\' 6"  5\' 6"  5\' 6"   Weight 320 lbs 10 oz 305 lbs 305 lbs  BMI 51.77 49.25 49.25  Systolic 140 - 137  Diastolic 92 - 87  Pulse 100 - 81    BP (!) 140/92   Pulse 100   Ht 5\' 6"  (1.676 m)   Wt (!) 320 lb 9.6 oz (145.4 kg)   BMI 51.75 kg/m   Wt Readings from Last 3 Encounters:  08/25/20 (!) 320 lb 9.6 oz (145.4 kg)  04/22/20 (!) 305 lb (138.3 kg)  03/06/20 (!) 305 lb (138.3 kg)    Physical Exam  Constitutional:  Body mass index is 51.75 kg/m.,  not in acute  distress, normal state of mind Eyes: PERRLA, EOMI, no exophthalmos ENT: moist mucous membranes, no gross thyromegaly, no gross cervical lymphadenopathy Cardiovascular: normal precordial activity, Regular Rate and Rhythm, no Murmur/Rubs/Gallops,  no dorsal cervical fat pad. Respiratory:  adequate breathing efforts, no gross chest deformity, Clear to auscultation bilaterally Gastrointestinal: abdomen soft, Non -tender, No distension, Bowel Sounds present, no gross organomegaly Musculoskeletal: no gross deformities, strength intact in all four extremities Skin: moist, warm, no rashes, no abdominal striae.  + Diffuse tattoos  Neurological: no tremor with outstretched hands, Deep tendon reflexes normal in bilateral lower extremities.  CMP ( most recent) CMP     Component Value Date/Time   NA 136 08/01/2019 2011   K 3.2 (L) 08/01/2019 2011   CL 104 08/01/2019 2011   CO2 23 08/01/2019 2011   GLUCOSE 108 (H) 08/01/2019 2011   BUN 7 08/01/2019 2011   CREATININE 0.50 08/01/2019 2011   CALCIUM 9.3 08/01/2019 2011   PROT 6.2 (L) 08/01/2019 2011   ALBUMIN 2.5 (L) 08/01/2019 2011   AST 19 08/01/2019 2011   ALT 20 08/01/2019 2011   ALKPHOS 67 08/01/2019 2011   BILITOT 0.4 08/01/2019 2011   GFRNONAA >60 08/01/2019 2011   GFRAA >60 08/01/2019 2011     Diabetic Labs (most recent): Lab Results  Component Value Date   HGBA1C 5.7 08/25/2020   HGBA1C 5.8 (H) 06/20/2019      Lab Results  Component Value Date   TSH 1.460 11/08/2019           Assessment & Plan:   1. Morbid obesity (HCC) 2. Prediabetes 3. Essential hypertension, benign 4.  Current smoker   - [employer]  is being seen at a kind request of Avon Gully, MD. - I have reviewed her available endocrine records and clinically evaluated the patient. - Based on these reviews, she has prediabetes, morbid obesity, hypertension, current smoker. Presentation is significant for low lifelong weight problem associated with  positive caloric intake. Approaches for weight management were discussed with her starting from lifestyle change, consideration of pharmaceutical agents, and as a last resort surgical approaches. She would benefit the most from lifestyle modification. - she acknowledges that there is a room for improvement in her food and drink choices. - Suggestion is made for her to avoid simple carbohydrates  from her diet including Cakes, Sweet Desserts, Ice Cream, Soda (diet and regular), Sweet Tea, Candies, Chips, Cookies, Store Bought Juices, Alcohol in Excess of  1-2 drinks a day, Artificial Sweeteners,  Coffee Creamer, and "Sugar-free" Products, Lemonade, and all other processed pastries.   -Unlikely for her to have endocrine dysfunction, however will undergo thyroid function test as well as adrenal sufficiency.  Weight loss target of 10% of her current body weight which is 32 pounds to achieve in 1-2 years was discussed with her. -Optimal exercise regimen of long walks was discussed with her.  -She would benefit from consulting with a dietitian, arrangement will be made for her to see Norm Salt, CDE.    -Regarding her history of prediabetes, she would benefit from early initiation of metformin in the hope of resting or delaying her progress to type 2 diabetes. I discussed initiated metformin 500 mg XR p.o. daily after breakfast, patient hesitantly  Accepts.  The patient was counseled on the dangers of tobacco use, and was advised to quit.  Reviewed strategies to maximize success, including removing cigarettes and smoking materials from environment.  She is advised to maintain her hydrochlorothiazide for blood pressure management.  - she is advised to maintain close follow up with Avon Gully, MD for primary care needs.   - Time spent with the patient: 60 minutes, of which >50% was spent in  counseling her about her morbid obesity, diabetes, smoking, hypertension and the rest in obtaining  information about her symptoms, reviewing her previous labs/studies ( including abstractions from other facilities),  evaluations, and treatments,  and developing a plan to confirm diagnosis and long term treatment based on the  latest standards of care/guidelines; and documenting her care.  [employer] participated in the discussions, expressed understanding, and voiced agreement with the above plans.  All questions were answered to her satisfaction. she is encouraged to contact clinic should she have any questions or concerns prior to her return visit.  Follow up plan: Return in about 3 months (around 11/25/2020) for F/U with Pre-visit Labs.   Marquis Lunch, MD [facility] [address] [place], [place] [address] Phone: [phone]  Fax: [phone]     08/25/2020, 8:45 PM  This note was partially dictated with voice recognition software. Similar sounding words can be transcribed inadequately or may not  be corrected upon review.

## 2020-09-02 ENCOUNTER — Ambulatory Visit (INDEPENDENT_AMBULATORY_CARE_PROVIDER_SITE_OTHER): Payer: Medicaid Other | Admitting: Orthopaedic Surgery

## 2020-09-02 ENCOUNTER — Other Ambulatory Visit: Payer: Self-pay

## 2020-09-02 ENCOUNTER — Encounter: Payer: Self-pay | Admitting: Orthopaedic Surgery

## 2020-09-02 DIAGNOSIS — M25562 Pain in left knee: Secondary | ICD-10-CM | POA: Diagnosis not present

## 2020-09-02 DIAGNOSIS — F17210 Nicotine dependence, cigarettes, uncomplicated: Secondary | ICD-10-CM

## 2020-09-02 DIAGNOSIS — G8929 Other chronic pain: Secondary | ICD-10-CM

## 2020-09-02 DIAGNOSIS — M25561 Pain in right knee: Secondary | ICD-10-CM | POA: Diagnosis not present

## 2020-09-02 DIAGNOSIS — Z6841 Body Mass Index (BMI) 40.0 and over, adult: Secondary | ICD-10-CM

## 2020-09-02 MED ORDER — HYDROCODONE-ACETAMINOPHEN 5-325 MG PO TABS: ORAL_TABLET | ORAL | 0 refills | Status: DC

## 2020-09-02 NOTE — Progress Notes (Signed)
PROCEDURE NOTE:  The patient requests injections of the left knee , verbal consent was obtained.  The left knee was prepped appropriately after time out was performed.   Sterile technique was observed and injection of 1 cc of Celestone 6 mg with several cc's of plain xylocaine. Anesthesia was provided by ethyl chloride and a 20-gauge needle was used to inject the knee area. The injection was tolerated well.  A band aid dressing was applied.  The patient was advised to apply ice later today and tomorrow to the injection sight as needed.  PROCEDURE NOTE:  The patient requests injections of the right knee , verbal consent was obtained.  The right knee was prepped appropriately after time out was performed.   Sterile technique was observed and injection of 1 cc of Celestone 6 mg with several cc's of plain xylocaine. Anesthesia was provided by ethyl chloride and a 20-gauge needle was used to inject the knee area. The injection was tolerated well.  A band aid dressing was applied.  The patient was advised to apply ice later today and tomorrow to the injection sight as needed.  I have reviewed the [place] Controlled Substance Reporting System web site prior to prescribing narcotic medicine for this patient.   Return prn.  Call if any problem.  Precautions discussed.   Electronically Signed Darreld Mclean, MD 5/17/20229:19 AM

## 2020-09-24 ENCOUNTER — Telehealth: Payer: Self-pay | Admitting: Orthopaedic Surgery

## 2020-09-24 NOTE — Telephone Encounter (Signed)
Can she get 3 shots at the same time, Dr. Hilda Lias?

## 2020-09-24 NOTE — Telephone Encounter (Signed)
Patient is wanting to come early, she has an appt on 10/02/20 and now she also wants one in both knees and her hip.  Please call her back at [phone]

## 2020-09-29 NOTE — Telephone Encounter (Signed)
I have tried to call patient and explain about only getting two shots and she could choose. If she was to call back please let her know this. This is Dr. Sanjuan Dame reply to her question.

## 2020-10-02 ENCOUNTER — Other Ambulatory Visit: Payer: Self-pay

## 2020-10-02 ENCOUNTER — Ambulatory Visit (INDEPENDENT_AMBULATORY_CARE_PROVIDER_SITE_OTHER): Payer: Medicaid Other | Admitting: Orthopaedic Surgery

## 2020-10-02 ENCOUNTER — Encounter: Payer: Self-pay | Admitting: Orthopaedic Surgery

## 2020-10-02 DIAGNOSIS — F17210 Nicotine dependence, cigarettes, uncomplicated: Secondary | ICD-10-CM

## 2020-10-02 DIAGNOSIS — Z6841 Body Mass Index (BMI) 40.0 and over, adult: Secondary | ICD-10-CM

## 2020-10-02 DIAGNOSIS — G8929 Other chronic pain: Secondary | ICD-10-CM | POA: Diagnosis not present

## 2020-10-02 DIAGNOSIS — M25561 Pain in right knee: Secondary | ICD-10-CM | POA: Diagnosis not present

## 2020-10-02 DIAGNOSIS — M25562 Pain in left knee: Secondary | ICD-10-CM | POA: Diagnosis not present

## 2020-10-02 NOTE — Progress Notes (Signed)
PROCEDURE NOTE:  The patient requests injections of the right knee , verbal consent was obtained.  The right knee was prepped appropriately after time out was performed.   Sterile technique was observed and injection of 1 cc of DepoMedrol 40mg  with several cc's of plain xylocaine. Anesthesia was provided by ethyl chloride and a 20-gauge needle was used to inject the knee area. The injection was tolerated well.  A band aid dressing was applied.  The patient was advised to apply ice later today and tomorrow to the injection sight as needed.  PROCEDURE NOTE:  The patient requests injections of the left knee , verbal consent was obtained.  The left knee was prepped appropriately after time out was performed.   Sterile technique was observed and injection of 1 cc of DepoMedrol 40 mg with several cc's of plain xylocaine. Anesthesia was provided by ethyl chloride and a 20-gauge needle was used to inject the knee area. The injection was tolerated well.  A band aid dressing was applied.  The patient was advised to apply ice later today and tomorrow to the injection sight as needed.   Encounter Diagnoses  Name Primary?   Chronic pain of both knees Yes   Nicotine dependence, cigarettes, uncomplicated    Morbid obesity (HCC)    Body mass index 50.0-59.9, adult (HCC)    Return as needed.  Call if any problem.  Precautions discussed.  Electronically Signed 08-15-1994, MD 6/16/20228:13 AM

## 2020-10-02 NOTE — Patient Instructions (Signed)
Steps to Quit Smoking Smoking tobacco is the leading cause of preventable death. It can affect almost every organ in the body. Smoking puts you and people around you at risk for many serious, long-lasting (chronic) diseases. Quitting smoking can be hard, but it is one of the best things that you can do for your health. It is never too late to quit. How do I get ready to quit? When you decide to quit smoking, make a plan to help you succeed. Before you quit: Pick a date to quit. Set a date within the next 2 weeks to give you time to prepare. Write down the reasons why you are quitting. Keep this list in places where you will see it often. Tell your family, friends, and co-workers that you are quitting. Their support is important. Talk with your doctor about the choices that may help you quit. Find out if your health insurance will pay for these treatments. Know the people, places, things, and activities that make you want to smoke (triggers). Avoid them. What first steps can I take to quit smoking? Throw away all cigarettes at home, at work, and in your car. Throw away the things that you use when you smoke, such as ashtrays and lighters. Clean your car. Make sure to empty the ashtray. Clean your home, including curtains and carpets. What can I do to help me quit smoking? Talk with your doctor about taking medicines and seeing a counselor at the same time. You are more likely to succeed when you do both. If you are pregnant or breastfeeding, talk with your doctor about counseling or other ways to quit smoking. Do not take medicine to help you quit smoking unless your doctor tells you to do so. To quit smoking: Quit right away Quit smoking totally, instead of slowly cutting back on how much you smoke over a period of time. Go to counseling. You are more likely to quit if you go to counseling sessions regularly. Take medicine You may take medicines to help you quit. Some medicines need a  prescription, and some you can buy over-the-counter. Some medicines may contain a drug called nicotine to replace the nicotine in cigarettes. Medicines may: Help you to stop having the desire to smoke (cravings). Help to stop the problems that come when you stop smoking (withdrawal symptoms). Your doctor may ask you to use: Nicotine patches, gum, or lozenges. Nicotine inhalers or sprays. Non-nicotine medicine that is taken by mouth. Find resources Find resources and other ways to help you quit smoking and remain smoke-free after you quit. These resources are most helpful when you use them often. They include: Online chats with a counselor. Phone quitlines. Printed self-help materials. Support groups or group counseling. Text messaging programs. Mobile phone apps. Use apps on your mobile phone or tablet that can help you stick to your quit plan. There are many free apps for mobile phones and tablets as well as websites. Examples include Quit Guide from the CDC and smokefree.gov  What things can I do to make it easier to quit?  Talk to your family and friends. Ask them to support and encourage you. Call a phone quitline (1-800-QUIT-NOW), reach out to support groups, or work with a counselor. Ask people who smoke to not smoke around you. Avoid places that make you want to smoke, such as: Bars. Parties. Smoke-break areas at work. Spend time with people who do not smoke. Lower the stress in your life. Stress can make you want to   smoke. Try these things to help your stress: Getting regular exercise. Doing deep-breathing exercises. Doing yoga. Meditating. Doing a body scan. To do this, close your eyes, focus on one area of your body at a time from head to toe. Notice which parts of your body are tense. Try to relax the muscles in those areas. How will I feel when I quit smoking? Day 1 to 3 weeks Within the first 24 hours, you may start to have some problems that come from quitting tobacco.  These problems are very bad 2-3 days after you quit, but they do not often last for more than 2-3 weeks. You may get these symptoms: Mood swings. Feeling restless, nervous, angry, or annoyed. Trouble concentrating. Dizziness. Strong desire for high-sugar foods and nicotine. Weight gain. Trouble pooping (constipation). Feeling like you may vomit (nausea). Coughing or a sore throat. Changes in how the medicines that you take for other issues work in your body. Depression. Trouble sleeping (insomnia). Week 3 and afterward After the first 2-3 weeks of quitting, you may start to notice more positive results, such as: Better sense of smell and taste. Less coughing and sore throat. Slower heart rate. Lower blood pressure. Clearer skin. Better breathing. Fewer sick days. Quitting smoking can be hard. Do not give up if you fail the first time. Some people need to try a few times before they succeed. Do your best to stick to your quit plan, and talk with your doctor if you have any questions or concerns. Summary Smoking tobacco is the leading cause of preventable death. Quitting smoking can be hard, but it is one of the best things that you can do for your health. When you decide to quit smoking, make a plan to help you succeed. Quit smoking right away, not slowly over a period of time. When you start quitting, seek help from your doctor, family, or friends. This information is not intended to replace advice given to you by your health care provider. Make sure you discuss any questions you have with your health care provider. Document Revised: 12/29/2018 Document Reviewed: 06/24/2018 Elsevier Patient Education  2022 Elsevier Inc.  

## 2020-10-13 ENCOUNTER — Encounter: Payer: Self-pay | Admitting: Nutrition

## 2020-10-13 ENCOUNTER — Other Ambulatory Visit: Payer: Self-pay

## 2020-10-13 ENCOUNTER — Encounter: Payer: Medicaid Other | Attending: Internal Medicine | Admitting: Nutrition

## 2020-10-13 VITALS — Ht 66.0 in | Wt 308.0 lb

## 2020-10-13 DIAGNOSIS — R7303 Prediabetes: Secondary | ICD-10-CM

## 2020-10-13 DIAGNOSIS — E66813 Obesity, class 3: Secondary | ICD-10-CM

## 2020-10-13 NOTE — Progress Notes (Signed)
Medical Nutrition Therapy  Appointment Start time:  1300  Appointment End time:  1400  Primary concerns today: Morbid Obesity  Referral diagnosis: E66.01, E73.9 Preferred learning style: no preference. Learning readiness: Change in progress    NUTRITION ASSESSMENT   Smoking 3-4 a day. Down from 1/2 pack.  Anthropometrics  Wt Readings from Last 3 Encounters:  10/13/20 (!) 308 lb (139.7 kg)  08/25/20 (!) 320 lb 9.6 oz (145.4 kg)  04/22/20 (!) 305 lb (138.3 kg)   Ht Readings from Last 3 Encounters:  10/13/20 _0  (1.676 m)  08/25/20 _1  (1.676 m)  04/22/20 _2  (1.676 m)   Body mass index is 49.71 kg/m. _3 @ Facility age limit for growth percentiles is 20 years. Facility age limit for growth percentiles is 20 years.    Clinical Medical Hx: Prediabetes,  Medications: Metformin 500 mg once a day Labs:  Lab Results  Component Value Date   HGBA1C 5.7 08/25/2020   CMP Latest Ref Rng & Units 08/01/2019 07/29/2016 11/22/2014  Glucose 70 - 99 mg/dL 108(H) 104(H) 134(H)  BUN 6 - 20 mg/dL _4 Creatinine 0.44 - 1.00 mg/dL 0.50 0.58 0.59  Sodium 135 - 145 mmol/L 136 139 139  Potassium 3.5 - 5.1 mmol/L 3.2(L) 4.3 3.6  Chloride 98 - 111 mmol/L 104 109 110  CO2 22 - 32 mmol/L _5 Calcium 8.9 - 10.3 mg/dL 9.3 9.5 9.1  Total Protein 6.5 - 8.1 g/dL 6.2(L) 7.3 7.1  Total Bilirubin 0.3 - 1.2 mg/dL 0.4 0.4 0.4  Alkaline Phos 38 - 126 U/L 67 57 68  AST 15 - 41 U/L _6 ALT 0 - 44 U/L 20 29 34   Lipid Panel  Notable Signs/Symptoms: Fatigue, increased thirst, frequent urination  Lifestyle & Dietary Hx She lives with her mom. Sons 16, 11. Works first shift at [employer].   Estimated daily fluid intake: 32 oz Supplements: none Sleep: 6/7. Stress / self-care:  Current average weekly physical activity: walks some with kids.   24-Hr Dietary Recall First Meal: 2 pickled boiled eggs, Snack:  Second Meal: Shrimp stirfy with broccoli and  Snack:   Third Meal: BQ pork chop, cabbage and mac/cheese, [place] vanilla with frozen peaches Snack:  Beverages: 32 oz.  Estimated Energy Needs Calories: 1200 Carbohydrate: 135g Protein: 90g Fat: 33g   NUTRITION DIAGNOSIS  NB-1.1 Food and nutrition-related knowledge deficit As related to Pre diabetes and Obesity.  As evidenced by BMI 49 and A1C 5.7%.   NUTRITION INTERVENTION  Nutrition education (E-1) on the following topics:  Goals  Follow My Plate Eat meals on time Increase lower carb vegetables Drink only water -100 oz per day. Walk 30 minutes water aerobics 2 times per week Lose 1-2 lbs per week.   Handouts Provided Include  MYplate  Learning Style & Readiness for Change Teaching method utilized: Visual & Auditory  Demonstrated degree of understanding via: Teach Back  Barriers to learning/adherence to lifestyle change: none  Goals Established by Pt  Follow My Plate Eat meals on time Increase lower carb vegetables Drink only water -100 oz per day. Walk 30 minutes water aerobics 2 times per week Lose 1-2 lbs per week.   MONITORING & EVALUATION Dietary intake, weekly physical activity, and weight in 1 month.  Next Steps  Patient is to work on meal planning and shopping better.Marland Kitchen

## 2020-10-13 NOTE — Patient Instructions (Signed)
Goals  Follow My Plate Eat meals on time Increase lower carb vegetables Drink only water -100 oz per day. Walk 30 minutes water aerobics 2 times per week Lose 1-2 lbs per week.

## 2020-10-15 ENCOUNTER — Encounter: Payer: Self-pay | Admitting: Nutrition

## 2020-10-16 ENCOUNTER — Telehealth: Payer: Self-pay | Admitting: Orthopaedic Surgery

## 2020-10-16 MED ORDER — HYDROCODONE-ACETAMINOPHEN 5-325 MG PO TABS: ORAL_TABLET | ORAL | 0 refills | Status: DC

## 2020-10-16 NOTE — Telephone Encounter (Signed)
Patient requests Hydrocodone/Acetaminophen 5-325 mgs.  Qty  28  Sig: One tablet every six hours for pain.  Limit 7 days.         [facility] [address].

## 2020-11-06 ENCOUNTER — Encounter: Payer: Self-pay | Admitting: Orthopaedic Surgery

## 2020-11-06 ENCOUNTER — Other Ambulatory Visit: Payer: Self-pay

## 2020-11-06 ENCOUNTER — Ambulatory Visit (INDEPENDENT_AMBULATORY_CARE_PROVIDER_SITE_OTHER): Payer: Medicaid Other | Admitting: Orthopaedic Surgery

## 2020-11-06 DIAGNOSIS — G8929 Other chronic pain: Secondary | ICD-10-CM | POA: Diagnosis not present

## 2020-11-06 DIAGNOSIS — F17210 Nicotine dependence, cigarettes, uncomplicated: Secondary | ICD-10-CM

## 2020-11-06 DIAGNOSIS — M25562 Pain in left knee: Secondary | ICD-10-CM

## 2020-11-06 DIAGNOSIS — M25561 Pain in right knee: Secondary | ICD-10-CM

## 2020-11-06 DIAGNOSIS — Z6841 Body Mass Index (BMI) 40.0 and over, adult: Secondary | ICD-10-CM

## 2020-11-06 MED ORDER — HYDROCODONE-ACETAMINOPHEN 5-325 MG PO TABS: ORAL_TABLET | ORAL | 0 refills | Status: DC

## 2020-11-06 NOTE — Progress Notes (Signed)
PROCEDURE NOTE:  The patient requests injections of the right knee , verbal consent was obtained.  The right knee was prepped appropriately after time out was performed.   Sterile technique was observed and injection of 1 cc of Celestone 6mg  with several cc's of plain xylocaine. Anesthesia was provided by ethyl chloride and a 20-gauge needle was used to inject the knee area. The injection was tolerated well.  A band aid dressing was applied.  The patient was advised to apply ice later today and tomorrow to the injection sight as needed.   PROCEDURE NOTE:  The patient requests injections of the left knee , verbal consent was obtained.  The left knee was prepped appropriately after time out was performed.   Sterile technique was observed and injection of 1 cc of Celestone 6 mg with several cc's of plain xylocaine. Anesthesia was provided by ethyl chloride and a 20-gauge needle was used to inject the knee area. The injection was tolerated well.  A band aid dressing was applied.  The patient was advised to apply ice later today and tomorrow to the injection sight as needed.   I have reviewed the Controlled Substance Reporting System web site prior to prescribing narcotic medicine for this patient.  Call if any problem.  Precautions discussed.  I will see as needed.  Electronically Signed [place], MD 7/21/20229:07 AM

## 2020-11-07 DIAGNOSIS — F339 Major depressive disorder, recurrent, unspecified: Secondary | ICD-10-CM | POA: Diagnosis not present

## 2020-11-07 DIAGNOSIS — I10 Essential (primary) hypertension: Secondary | ICD-10-CM | POA: Diagnosis not present

## 2020-11-12 ENCOUNTER — Ambulatory Visit: Payer: Medicaid Other | Admitting: Nutrition

## 2020-11-13 DIAGNOSIS — N390 Urinary tract infection, site not specified: Secondary | ICD-10-CM | POA: Diagnosis not present

## 2020-11-19 ENCOUNTER — Ambulatory Visit: Payer: Medicaid Other | Admitting: Nutrition

## 2020-11-21 ENCOUNTER — Telehealth: Payer: Self-pay | Admitting: Orthopaedic Surgery

## 2020-11-21 NOTE — Telephone Encounter (Signed)
Patient called to relay that her prior authorization is the reason she is unable to get her pain medication HYDROcodone-acetaminophen (NORCO/VICODIN) 5-325 MG tablet   - [facility], [address]; states Dr Hilda Lias relayed to her at last visit 11/06/20 that the medication is getting harder to come by. Any advice?

## 2020-11-24 NOTE — Telephone Encounter (Signed)
I am trying to add Dr.Keeling to my profile. I am waiting on a fax to verify him. Until then I can not see this patient on my profile.

## 2020-11-24 NOTE — Telephone Encounter (Signed)
[name] Key: [id number] - PA Case ID: [id number]  Submitted online covermymeds.

## 2020-11-24 NOTE — Telephone Encounter (Signed)
It needs a PA because her insurance will only cover a 5 day supply. If it's only written for a 5 day supply they will cover it. The pharmacy did not have a key.

## 2020-11-25 NOTE — Telephone Encounter (Signed)
Approvedon August 8 PA Case: [id number], Status: Approved, Coverage Starts on: 11/24/2020 12:00:00 AM, Coverage Ends on: 05/23/2021 12:00:00 AM.  I called patient and advised.

## 2020-12-03 DIAGNOSIS — R7303 Prediabetes: Secondary | ICD-10-CM | POA: Diagnosis not present

## 2020-12-04 ENCOUNTER — Ambulatory Visit: Payer: Medicaid Other | Admitting: "Endocrinology

## 2020-12-04 LAB — T4, FREE: Free T4: 1.13 ng/dL (ref 0.82–1.77)

## 2020-12-04 LAB — CORTISOL-AM, BLOOD: Cortisol - AM: 10.7 ug/dL (ref 6.2–19.4)

## 2020-12-04 LAB — TSH: TSH: 1.64 u[IU]/mL (ref 0.450–4.500)

## 2020-12-09 DIAGNOSIS — I10 Essential (primary) hypertension: Secondary | ICD-10-CM | POA: Diagnosis not present

## 2020-12-09 DIAGNOSIS — N390 Urinary tract infection, site not specified: Secondary | ICD-10-CM | POA: Diagnosis not present

## 2020-12-09 DIAGNOSIS — R002 Palpitations: Secondary | ICD-10-CM | POA: Diagnosis not present

## 2020-12-10 ENCOUNTER — Ambulatory Visit (HOSPITAL_COMMUNITY)
Admission: RE | Admit: 2020-12-10 | Discharge: 2020-12-10 | Disposition: A | Discharge status: Home / Self Care | Payer: Medicaid Other | Source: Ambulatory Visit | Attending: Internal Medicine | Admitting: Internal Medicine

## 2020-12-10 ENCOUNTER — Encounter: Payer: Self-pay | Admitting: "Endocrinology

## 2020-12-10 ENCOUNTER — Ambulatory Visit (INDEPENDENT_AMBULATORY_CARE_PROVIDER_SITE_OTHER): Payer: Medicaid Other | Admitting: "Endocrinology

## 2020-12-10 ENCOUNTER — Other Ambulatory Visit: Payer: Self-pay

## 2020-12-10 DIAGNOSIS — I10 Essential (primary) hypertension: Secondary | ICD-10-CM | POA: Diagnosis not present

## 2020-12-10 DIAGNOSIS — R002 Palpitations: Secondary | ICD-10-CM | POA: Diagnosis not present

## 2020-12-10 DIAGNOSIS — R7303 Prediabetes: Secondary | ICD-10-CM | POA: Diagnosis not present

## 2020-12-10 NOTE — Patient Instructions (Signed)

## 2020-12-10 NOTE — Progress Notes (Signed)
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   CESAREAN SECTION     COLPOSCOPY     Social History   Socioeconomic History   Marital status: Single    Spouse name: Not on file   Number of children: 3   Years of education: Not on file   Highest education level: Not on file  Occupational History   Not on file  Tobacco Use   Smoking status: Light Smoker    Packs/day: 0.25    Years: 2.00    Pack years: 0.50    Types: Cigarettes   Smokeless tobacco: Never   Tobacco comments:    "2-3 cigs per day"  Vaping Use   Vaping Use: Never used  Substance and Sexual Activity   Alcohol use: No   Drug use: No   Sexual activity: Yes    Birth control/protection: None  Other Topics Concern   Not on file  Social History Narrative   Not on file   Social Determinants of Health   Financial Resource Strain: Not on file  Food Insecurity: Not on file  Transportation Needs: Not on file  Physical Activity: Not on file  Stress: Not on file  Social Connections: Not on file   Family History  Problem Relation Age of Onset   Cancer Other    Diabetes Other    Thyroid disease Mother    Hypertension Mother    Heart attack Mother    Cancer Maternal Grandmother    Asthma Maternal Grandmother    Arthritis Maternal Grandmother    Cancer Maternal Grandfather    Diabetes Maternal Grandfather    Thyroid disease Maternal Aunt    Outpatient Encounter Medications as of 12/10/2020  Medication Sig   acetaminophen (TYLENOL) 325 MG tablet Take 650 mg by mouth every 6 (six) hours as  needed.   hydrochlorothiazide (HYDRODIURIL) 12.5 MG tablet Take 12.5 mg by mouth daily.   HYDROcodone-acetaminophen (NORCO/VICODIN) 5-325 MG tablet One tablet every six hours for pain.  Limit 7 days.   meloxicam (MOBIC) 15 MG tablet Take 1 tablet (15 mg total) by mouth daily.   metFORMIN (GLUCOPHAGE XR) 500 MG 24 hr tablet Take 1 tablet (500 mg total) by mouth daily with breakfast.   No facility-administered encounter medications on file as of 12/10/2020.   ALLERGIES: Allergies  Allergen Reactions   Bee Venom Swelling    VACCINATION STATUS: Immunization History  Administered Date(s) Administered   Tdap 08/03/2019    HPI [name] is 35 y.o. female who presents today with a medical history as above. she is being seen in consultation for weight management/prediabetes requested by Avon Gully, MD.   She is being managed with metformin for prediabetes.  See notes from previous visit.  She has dealt with heavy weight  all of her adult life, weight 300+ during high school.  With some lifestyle modification couple years ago she lost 50 pounds. Since her last visit, she lost 15 pounds. She denies any previous history of diabetes however has had prediabetes in 2021.  Her previsit work-up rules out adrenal/thyroid dysfunction.    She has hypertension on treatment.  She has family history of diabetes type 2.  She denies endocrine dysfunction such as hypothyroidism or Cushing syndrome.  She admits to dietary indiscretions including consumption of sweetened beverages and sweet desserts.  She is active smoker.  She was able to get pregnant 4 times, has 3 live children.   She denies any major coronary artery disease diagnosed.  She underwent bladder reconstructive surgery  and cesarean sections.  Review of Systems  Constitutional: + Recent weight loss, + fatigue, no subjective hyperthermia, no subjective hypothermia Eyes: no blurry vision, no xerophthalmia   Objective:    Vitals with BMI  12/10/2020 10/13/2020 08/25/2020  Height 5\' 6"  5\' 6"  5\' 6"   Weight 308 lbs 308 lbs 320 lbs 10 oz  BMI 49.74 49.74 51.77  Systolic 132 - 140  Diastolic 80 - 92  Pulse 68 - 100    BP 132/80   Pulse 68   Ht 5\' 6"  (1.676 m)   Wt (!) 308 lb (139.7 kg)   BMI 49.71 kg/m   Wt Readings from Last 3 Encounters:  12/10/20 (!) 308 lb (139.7 kg)  10/13/20 (!) 308 lb (139.7 kg)  08/25/20 (!) 320 lb 9.6 oz (145.4 kg)    Physical Exam  Constitutional:  Body mass index is 49.71 kg/m.,  not in acute distress, normal state of mind Eyes: PERRLA, EOMI, no exophthalmos ENT: moist mucous membranes, no gross thyromegaly, no gross cervical lymphadenopathy   CMP ( most recent) CMP     Component Value Date/Time   NA 136 08/01/2019 2011   K 3.2 (L) 08/01/2019 2011   CL 104 08/01/2019 2011   CO2 23 08/01/2019 2011   GLUCOSE 108 (H) 08/01/2019 2011   BUN 7 08/01/2019 2011   CREATININE 0.50 08/01/2019 2011   CALCIUM 9.3 08/01/2019 2011   PROT 6.2 (L) 08/01/2019 2011   ALBUMIN 2.5 (L) 08/01/2019 2011   AST 19 08/01/2019 2011   ALT 20 08/01/2019 2011   ALKPHOS 67 08/01/2019 2011   BILITOT 0.4 08/01/2019 2011   GFRNONAA >60 08/01/2019 2011   GFRAA >60 08/01/2019 2011     Diabetic Labs (most recent): Lab Results  Component Value Date   HGBA1C 5.7 08/25/2020   HGBA1C 5.8 (H) 06/20/2019      Lab Results  Component Value Date   TSH 1.640 12/03/2020   TSH 1.460 11/08/2019   FREET4 1.13 12/03/2020           Assessment & Plan:   1. Morbid obesity (HCC) 2. Prediabetes 3. Essential hypertension, benign 4.  Current smoker  With continued lifestyle change, she was able to lose 15 more pounds. -Her insurance will not provide coverage for Saxenda nor Qsymia.  she has prediabetes, morbid obesity, hypertension, current smoker. Presentation is significant for lifelong weight problem associated with positive caloric intake. She is encouraged to intensify her efforts for weight loss from  lifestyle change.   -Her insurance will not provide coverage for pharmaceutical agents, her last resort would be surgical options.  - she acknowledges that there is a room for improvement in her food and drink choices. - Suggestion is made for her to avoid  simple carbohydrates  from her diet including Cakes, Sweet Desserts, Ice Cream, Soda (diet and regular), Sweet Tea, Candies, Chips, Cookies, Store Bought Juices, Alcohol in Excess of  1-2 drinks a day, Artificial Sweeteners,  Coffee Creamer, and "Sugar-free" Products, Lemonade. This will help patient to have more stable blood glucose profile and potentially avoid unintended weight gain.   Weight loss target of 10% of her current body weight which is 32 pounds to achieve in 1-2 years was discussed with her. -Optimal exercise regimen of long walks was discussed with her.  -She would benefit from consulting with a dietitian, arrangement will be made for her to see Norm Salt, CDE.    -Regarding her history of prediabetes, she would continue to benefit from metformin.  I advised her to continue metformin 500 mg XR p.o. daily after breakfast.   The patient was counseled on the dangers of tobacco use, and was advised to quit.  Reviewed strategies to maximize success, including removing cigarettes and smoking materials from environment.  She is advised to maintain her hydrochlorothiazide for blood pressure management.  - she is advised to maintain close follow up with Avon Gully, MD for primary care needs.   I spent 23 minutes in the care of the patient today including review of labs from Thyroid Function, CMP, and other relevant labs ; imaging/biopsy records (current and previous including abstractions from other facilities); face-to-face time discussing  her lab results and symptoms, medications doses, her options of short and long term treatment based on the latest standards of care / guidelines;   and documenting the encounter.  [employer]  participated in the discussions, expressed understanding, and voiced agreement with the above plans.  All questions were answered to her satisfaction. she is encouraged to contact clinic should she have any questions or concerns prior to her return visit.   Follow up plan: Return in about 6 months (around 06/12/2021) for A1c -NV.   Marquis Lunch, MD [facility] [address] [place], [place] [address] Phone: [phone]  Fax: [phone]     12/10/2020, 12:30 PM  This note was partially dictated with voice recognition software. Similar sounding words can be transcribed inadequately or may not  be corrected upon review.

## 2020-12-25 ENCOUNTER — Other Ambulatory Visit: Payer: Self-pay

## 2020-12-25 ENCOUNTER — Encounter: Payer: Self-pay | Admitting: Orthopaedic Surgery

## 2020-12-25 ENCOUNTER — Ambulatory Visit (INDEPENDENT_AMBULATORY_CARE_PROVIDER_SITE_OTHER): Payer: Medicaid Other | Admitting: Orthopaedic Surgery

## 2020-12-25 DIAGNOSIS — Z6841 Body Mass Index (BMI) 40.0 and over, adult: Secondary | ICD-10-CM

## 2020-12-25 DIAGNOSIS — M25561 Pain in right knee: Secondary | ICD-10-CM | POA: Diagnosis not present

## 2020-12-25 DIAGNOSIS — G8929 Other chronic pain: Secondary | ICD-10-CM

## 2020-12-25 DIAGNOSIS — M25562 Pain in left knee: Secondary | ICD-10-CM

## 2020-12-25 MED ORDER — HYDROCODONE-ACETAMINOPHEN 5-325 MG PO TABS: ORAL_TABLET | ORAL | 0 refills | Status: DC

## 2020-12-25 NOTE — Patient Instructions (Signed)

## 2020-12-25 NOTE — Progress Notes (Addendum)
PROCEDURE NOTE:  The patient requests injections of the right knee , verbal consent was obtained.  The right knee was prepped appropriately after time out was performed.   Sterile technique was observed and injection of 1 cc of Celestone 6mg  with several cc's of plain xylocaine. Anesthesia was provided by ethyl chloride and a 20-gauge needle was used to inject the knee area. The injection was tolerated well.  A band aid dressing was applied.  The patient was advised to apply ice later today and tomorrow to the injection sight as needed.   PROCEDURE NOTE:  The patient requests injections of the left knee , verbal consent was obtained.  The left knee was prepped appropriately after time out was performed.   Sterile technique was observed and injection of 1 cc of Celestone 6 mg with several cc's of plain xylocaine. Anesthesia was provided by ethyl chloride and a 20-gauge needle was used to inject the knee area. The injection was tolerated well.  A band aid dressing was applied.  The patient was advised to apply ice later today and tomorrow to the injection sight as needed.   She has stopped smoking this last month.  She has lost more weight this last month.  Her left knee is more painful.  I will get MRI.  Return in three weeks.  Encounter Diagnoses  Name Primary?   Chronic pain of both knees Yes   Morbid obesity (HCC)    Body mass index 50.0-59.9, adult (HCC)    Call if any problem.  Precautions discussed.  I refilled her pain medicine.  I have reviewed the 08-15-1994 Controlled Substance Reporting System web site prior to prescribing narcotic medicine for this patient.    Electronically Signed [place], MD 9/8/20228:53 AM  Addendum: Patient has giving way of the right knee, swelling and popping.  It is not getting better.  Exam shows effusion of the right knee, crepitus, ROM 0 to 105, medial joint pain, positive medial McMurray.  NV intact.  No distal edema.   Limp to the right.  She has been given home exercises for knee to do, as before.  I will get MRI as I am concerned about medial meniscus tear and she has not improved over time with conservative treatment.  Electronically Signed 02/24/2021, MD 9/27/20229:13 AM

## 2021-01-07 ENCOUNTER — Telehealth: Payer: Self-pay | Admitting: Radiology

## 2021-01-07 NOTE — Telephone Encounter (Signed)
I called patient to advise MRI cancelled, her Medicaid has denied I have put the information on [place] desk for the peer to peer, in her bottom tier below the yellow folders  The number to call to request the peer to peer is [phone] ext [phone]

## 2021-01-08 ENCOUNTER — Telehealth: Payer: Self-pay | Admitting: Radiology

## 2021-01-08 NOTE — Telephone Encounter (Signed)
P2P is scheduled for 01/13/21 at 9am EST with Dr Thersa Salt.  They will call my line and I will transfer to Dr Hilda Lias in clinic.

## 2021-01-09 ENCOUNTER — Ambulatory Visit (HOSPITAL_COMMUNITY): Payer: Medicaid Other

## 2021-01-14 ENCOUNTER — Telehealth: Payer: Self-pay | Admitting: Orthopaedic Surgery

## 2021-01-14 NOTE — Telephone Encounter (Signed)
She wants to know if she can have her shot tomorrow.   Please call her back

## 2021-01-14 NOTE — Telephone Encounter (Signed)
Patient wants to know what the verdict is on her MRI it was canceled yesterday.  She said Dr. Hilda Lias was to do a PEER to PEER meeting yesterday.   I will cancel her appt for tomorrow since she hasn't had her MRI yet.

## 2021-01-15 ENCOUNTER — Encounter: Payer: Self-pay | Admitting: Orthopaedic Surgery

## 2021-01-15 ENCOUNTER — Ambulatory Visit (INDEPENDENT_AMBULATORY_CARE_PROVIDER_SITE_OTHER): Payer: Medicaid Other | Admitting: Orthopaedic Surgery

## 2021-01-15 ENCOUNTER — Other Ambulatory Visit: Payer: Self-pay

## 2021-01-15 DIAGNOSIS — G8929 Other chronic pain: Secondary | ICD-10-CM

## 2021-01-15 DIAGNOSIS — M25562 Pain in left knee: Secondary | ICD-10-CM | POA: Diagnosis not present

## 2021-01-15 DIAGNOSIS — Z6841 Body Mass Index (BMI) 40.0 and over, adult: Secondary | ICD-10-CM

## 2021-01-15 DIAGNOSIS — M7061 Trochanteric bursitis, right hip: Secondary | ICD-10-CM

## 2021-01-15 NOTE — Progress Notes (Signed)
My knee is hurting more.  She has pain in the left knee. She has effusion, crepitus and giving way.  She has no new trauma.  Left knee has ROM today of 0 to 105, limp left, crepitus, mild effusion and medial joint line pain.  She has positive medial McMurray.  She has no distal edema.  PROCEDURE NOTE:  The patient requests injections of the left knee , verbal consent was obtained.  The left knee was prepped appropriately after time out was performed.   Sterile technique was observed and injection of 1 cc of Celestone 6 mg with several cc's of plain xylocaine. Anesthesia was provided by ethyl chloride and a 20-gauge needle was used to inject the knee area. The injection was tolerated well.  A band aid dressing was applied.  The patient was advised to apply ice later today and tomorrow to the injection sight as needed.   Right hip is tender with lateral hip pain. ROM is good.  Encounter Diagnoses  Name Primary?   Chronic pain of left knee Yes   Trochanteric bursitis, right hip    Morbid obesity (HCC)    Body mass index 45.0-49.9, adult (HCC)    PROCEDURE NOTE:  The patient request injection, verbal consent was obtained.  The right trochanteric area of the hip was prepped appropriately after time out was performed.   Sterile technique was observed and injection of 1 cc of Celestone 6 mg with several cc's of plain xylocaine. Anesthesia was provided by ethyl chloride and a 20-gauge needle was used to inject the hip area. The injection was tolerated well.  A band aid dressing was applied.  The patient was advised to apply ice later today and tomorrow to the injection sight as needed.  She was denied MRI of the knee.  I am concerned she has meniscus tear.  We will appeal the decision.  She may need arthroscopy of the knee as she has not improved with conservative therapy.  Return in one month.  Call if any problem.  Precautions discussed.  Electronically Signed Darreld Mclean,  MD 9/29/20228:50 AM

## 2021-01-16 NOTE — Telephone Encounter (Signed)
I called to check on the appeal for MRI it is processing still date of appeal is 01/14/21 The appeal ID is [id number]

## 2021-01-22 ENCOUNTER — Telehealth: Payer: Self-pay | Admitting: Orthopaedic Surgery

## 2021-01-22 MED ORDER — HYDROCODONE-ACETAMINOPHEN 5-325 MG PO TABS: ORAL_TABLET | ORAL | 0 refills | Status: DC

## 2021-01-22 NOTE — Telephone Encounter (Signed)
Patient relays pharmacy does not have medication - asking if sent per office visit 01/15/21 HYDROcodone-acetaminophen (NORCO/VICODIN) 5-325 MG tablet 28 tablet  [facility], [address], [place]

## 2021-02-19 ENCOUNTER — Other Ambulatory Visit: Payer: Self-pay

## 2021-02-19 ENCOUNTER — Ambulatory Visit: Payer: Medicaid Other | Admitting: Orthopaedic Surgery

## 2021-02-19 ENCOUNTER — Other Ambulatory Visit: Payer: Self-pay | Admitting: Orthopaedic Surgery

## 2021-02-19 ENCOUNTER — Encounter: Payer: Self-pay | Admitting: Orthopaedic Surgery

## 2021-02-19 DIAGNOSIS — Z6841 Body Mass Index (BMI) 40.0 and over, adult: Secondary | ICD-10-CM

## 2021-02-19 DIAGNOSIS — M25562 Pain in left knee: Secondary | ICD-10-CM

## 2021-02-19 DIAGNOSIS — M25561 Pain in right knee: Secondary | ICD-10-CM

## 2021-02-19 DIAGNOSIS — G8929 Other chronic pain: Secondary | ICD-10-CM

## 2021-02-19 MED ORDER — HYDROCODONE-ACETAMINOPHEN 5-325 MG PO TABS: ORAL_TABLET | ORAL | 0 refills | Status: DC

## 2021-02-19 NOTE — Progress Notes (Signed)
PROCEDURE NOTE:  The patient requests injections of the left knee , verbal consent was obtained.  The left knee was prepped appropriately after time out was performed.   Sterile technique was observed and injection of 1 cc of DepoMedrol 40 mg with several cc's of plain xylocaine. Anesthesia was provided by ethyl chloride and a 20-gauge needle was used to inject the knee area. The injection was tolerated well.  A band aid dressing was applied.  The patient was advised to apply ice later today and tomorrow to the injection sight as needed.   PROCEDURE NOTE:  The patient requests injections of the right knee , verbal consent was obtained.  The right knee was prepped appropriately after time out was performed.   Sterile technique was observed and injection of 1 cc of DepoMedrol 40mg  with several cc's of plain xylocaine. Anesthesia was provided by ethyl chloride and a 20-gauge needle was used to inject the knee area. The injection was tolerated well.  A band aid dressing was applied.  The patient was advised to apply ice later today and tomorrow to the injection sight as needed.   Encounter Diagnoses  Name Primary?   Chronic pain of both knees Yes   Morbid obesity (HCC)    Body mass index 45.0-49.9, adult (HCC)    Begin PT for knees.  MRI to be done if not improved with PT.  Return in one month.  Call if any problem.  Precautions discussed.  Electronically Signed 09-12-1984, MD 11/3/20228:43 AM

## 2021-02-19 NOTE — Progress Notes (Signed)
referral

## 2021-02-20 ENCOUNTER — Telehealth: Payer: Self-pay

## 2021-02-20 NOTE — Telephone Encounter (Signed)
Patient stating she was suppose to get a script for Naproxen yesterday and her pharmacy never received one.  PATIENT USES [facility] [address]

## 2021-02-23 MED ORDER — NAPROXEN 500 MG PO TABS: 500.0000 mg | ORAL_TABLET | Freq: Two times a day (BID) | ORAL | 5 refills | Status: DC

## 2021-02-24 ENCOUNTER — Ambulatory Visit (HOSPITAL_COMMUNITY)
Admission: RE | Admit: 2021-02-24 | Discharge: 2021-02-24 | Disposition: A | Discharge status: Home / Self Care | Payer: Medicaid Other | Source: Ambulatory Visit | Attending: Gerontology | Admitting: Gerontology

## 2021-02-24 ENCOUNTER — Other Ambulatory Visit: Payer: Self-pay

## 2021-02-24 ENCOUNTER — Other Ambulatory Visit (HOSPITAL_COMMUNITY): Payer: Self-pay | Admitting: Gerontology

## 2021-02-24 DIAGNOSIS — Z0001 Encounter for general adult medical examination with abnormal findings: Secondary | ICD-10-CM | POA: Diagnosis not present

## 2021-02-24 DIAGNOSIS — M47816 Spondylosis without myelopathy or radiculopathy, lumbar region: Secondary | ICD-10-CM | POA: Diagnosis not present

## 2021-02-24 DIAGNOSIS — M549 Dorsalgia, unspecified: Secondary | ICD-10-CM

## 2021-02-24 DIAGNOSIS — I10 Essential (primary) hypertension: Secondary | ICD-10-CM | POA: Diagnosis not present

## 2021-02-24 DIAGNOSIS — R7303 Prediabetes: Secondary | ICD-10-CM | POA: Diagnosis not present

## 2021-03-01 ENCOUNTER — Encounter (HOSPITAL_COMMUNITY): Payer: Self-pay

## 2021-03-01 ENCOUNTER — Other Ambulatory Visit: Payer: Self-pay

## 2021-03-01 ENCOUNTER — Emergency Department (HOSPITAL_COMMUNITY)
Admission: EM | Admit: 2021-03-01 | Discharge: 2021-03-01 | Disposition: A | Discharge status: Home / Self Care | Payer: Medicaid Other | Attending: Emergency Medicine | Admitting: Emergency Medicine

## 2021-03-01 DIAGNOSIS — F17210 Nicotine dependence, cigarettes, uncomplicated: Secondary | ICD-10-CM | POA: Diagnosis not present

## 2021-03-01 DIAGNOSIS — Z7984 Long term (current) use of oral hypoglycemic drugs: Secondary | ICD-10-CM | POA: Insufficient documentation

## 2021-03-01 DIAGNOSIS — M545 Low back pain, unspecified: Secondary | ICD-10-CM | POA: Insufficient documentation

## 2021-03-01 DIAGNOSIS — I10 Essential (primary) hypertension: Secondary | ICD-10-CM | POA: Insufficient documentation

## 2021-03-01 DIAGNOSIS — Z79899 Other long term (current) drug therapy: Secondary | ICD-10-CM | POA: Insufficient documentation

## 2021-03-01 DIAGNOSIS — G8929 Other chronic pain: Secondary | ICD-10-CM

## 2021-03-01 MED ORDER — MELOXICAM 7.5 MG PO TABS: 7.5000 mg | ORAL_TABLET | Freq: Every day | ORAL | 0 refills | Status: DC

## 2021-03-01 MED ORDER — CYCLOBENZAPRINE HCL 10 MG PO TABS: 10.0000 mg | ORAL_TABLET | Freq: Two times a day (BID) | ORAL | 0 refills | Status: DC | PRN

## 2021-03-01 NOTE — ED Provider Notes (Addendum)
[facility] EMERGENCY DEPARTMENT Provider Note   CSN: [id number] Arrival date & time: 03/01/21  3614     History Chief Complaint  Patient presents with   Back Pain    [name] is a 35 y.o. female with medical history significant for HTN and HLD. Patient presents for ongoing low back pain that began 2 weeks ago when she was putting a wheelchair into the trunk of her car. Patient states that this pain is located in her lower back primarily on the left side and states it does not radiate anywhere. Patient describes this pain as burning and states that sitting down or bending over relieves the pain. Patient denies bowel/bladder incontinence, groin numbness, or pain that shoots into her legs.    Back Pain Associated symptoms: no numbness       Past Medical History:  Diagnosis Date   Arthritis    Hypertension    Kidney infection    UTI (urinary tract infection)    Vaginal Pap smear, abnormal     Patient Active Problem List   Diagnosis Date Noted   Morbid obesity (HCC) 08/25/2020   Chronic hypertension 11/08/2019   History of cesarean delivery 08/01/2019   BMI 50.0-59.9, adult (HCC) 08/01/2019   IUFD at less than 20 weeks of gestation 07/31/2019   Marijuana use 06/21/2019   Smoker 06/20/2019   Abnormal Pap smear of cervix 06/20/2019   Depression with anxiety 06/20/2019    Past Surgical History:  Procedure Laterality Date   BLADDER SURGERY     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   CESAREAN SECTION     COLPOSCOPY       OB History     Gravida  5   Para  3   Term  3   Preterm      AB  2   Living  3      SAB      IAB      Ectopic      Multiple      Live Births  3           Family History  Problem Relation Age of Onset   Cancer Other    Diabetes Other    Thyroid disease Mother    Hypertension Mother    Heart attack Mother    Cancer Maternal Grandmother    Asthma Maternal Grandmother    Arthritis Maternal Grandmother    Cancer  Maternal Grandfather    Diabetes Maternal Grandfather    Thyroid disease Maternal Aunt     Social History   Tobacco Use   Smoking status: Light Smoker    Packs/day: 0.25    Years: 2.00    Pack years: 0.50    Types: Cigarettes   Smokeless tobacco: Never   Tobacco comments:    "2-3 cigs per day"  Vaping Use   Vaping Use: Never used  Substance Use Topics   Alcohol use: No   Drug use: No    Home Medications Prior to Admission medications   Medication Sig Start Date End Date Taking? Authorizing Provider  acetaminophen (TYLENOL) 325 MG tablet Take 650 mg by mouth every 6 (six) hours as needed.    [provider]  hydrochlorothiazide (HYDRODIURIL) 12.5 MG tablet Take 12.5 mg by mouth daily. 08/20/20   [provider]  HYDROcodone-acetaminophen (NORCO/VICODIN) 5-325 MG tablet One tablet every six hours for pain.  Limit 7 days. 02/19/21   Darreld Mclean, MD  metFORMIN (GLUCOPHAGE XR)  500 MG 24 hr tablet Take 1 tablet (500 mg total) by mouth daily with breakfast. 08/25/20   Nida, Denman George, MD  naproxen (NAPROSYN) 500 MG tablet Take 1 tablet (500 mg total) by mouth 2 (two) times daily with a meal. 02/23/21   Darreld Mclean, MD    Allergies    Bee venom  Review of Systems   Review of Systems  Genitourinary:  Negative for decreased urine volume, difficulty urinating and flank pain.  Musculoskeletal:  Positive for back pain.  Neurological:  Negative for numbness.  All other systems reviewed and are negative.  Physical Exam Updated Vital Signs BP (!) 148/81 (BP Location: Left Arm)   Pulse 98   Temp 97.8 F (36.6 C) (Oral)   Resp 18   Ht 5\' 6"  (1.676 m)   Wt 136.1 kg   LMP  (Within Weeks)   SpO2 100%   BMI 48.42 kg/m   Physical Exam Constitutional:      General: She is not in acute distress.    Appearance: She is obese. She is not toxic-appearing.  HENT:     Head: Normocephalic.     Nose: Nose normal.     Mouth/Throat:     Mouth: Mucous membranes  are moist.  Cardiovascular:     Rate and Rhythm: Normal rate and regular rhythm.  Pulmonary:     Effort: Pulmonary effort is normal.     Breath sounds: Normal breath sounds.  Abdominal:     General: Abdomen is flat.     Palpations: Abdomen is soft.  Musculoskeletal:     Cervical back: Normal and normal range of motion.     Lumbar back: Tenderness present.  Skin:    General: Skin is warm and dry.     Capillary Refill: Capillary refill takes less than 2 seconds.  Neurological:     General: No focal deficit present.     Mental Status: She is alert.     Motor: No weakness.    ED Results / Procedures / Treatments   Labs (all labs ordered are listed, but only abnormal results are displayed) Labs Reviewed - No data to display  EKG None  Radiology No results found.  Procedures Procedures   Medications Ordered in ED Medications - No data to display  ED Course  I have reviewed the triage vital signs and the nursing notes.  Pertinent labs & imaging results that were available during my care of the patient were reviewed by me and considered in my medical decision making (see chart for details).    MDM Rules/Calculators/A&P                          35YOF with ongoing left sided back pain that began 2 weeks ago. Patient has been since for this same complaint and had imaging done that revealed osteoarthritis, narrowing and spurring of L3 and L4. Patient states she has not been seen by chiropractor for this and the quality of the pain is unchanged in nature. Patient denies any bowel or bladder incontinence, saddle anesthesia or pain that travels into her legs so concern for cauda equina is low. Patient continues that bending over relieves the pain she is experiencing. Patient has functionality and is able to get in and out of hospital bed without difficulty. Patient able to ambulate appropriately for entire duration of visit in unit. Due to recent imaging done on 11/8, no need for  further imaging seems  indicated due to no change in nature of pain. Patient has not been seen by orthopedist or physical therapy, but patient states she has upcoming appointments for both. Will discharge patient with muscle relaxer and NSAID and encourage patient to follow up with her previously scheduled PT and orthopedic appointments. Patient advised of return precautions and is agreeable to plan. Patient stable on discharge.   Final Clinical Impression(s) / ED Diagnoses Final diagnoses:  None    Rx / DC Orders ED Discharge Orders     None        Al Decant, [place] 03/01/21 1108    Al Decant, [place] 03/01/21 1110    Cheryll Cockayne, MD 03/02/21 [phone]

## 2021-03-01 NOTE — Discharge Instructions (Addendum)
Return to ED with any new or worsening symptoms such as bowel/bladder incontinence, groin numbness or pain traveling down into your legs Follow up and schedule appointment with orthopedics Follow up with physical therapy as planned Consider utilizing other means of treatment such as chiropractors or massage therapists Take medications as prescribed for back pain Attempt to lessen use of BC powders as discussed

## 2021-03-01 NOTE — ED Triage Notes (Signed)
Pt presents to ED with complaints of mid to lower left sided back pain x 2.5 weeks. Pt states she has been seen for this previously and was given a muscle relaxer and had xrays. Impression said no fractures but findings said mild degeneration with narrowing and spurring of L3 and L4. Was referred to ortho on Friday, does not have appointment yet. Pt states pain is no better. Pt states mobility has been limited.

## 2021-03-02 ENCOUNTER — Telehealth: Payer: Self-pay

## 2021-03-02 NOTE — Telephone Encounter (Signed)
Transition Care Management Unsuccessful Follow-up Telephone Call  Date of discharge and from where:  03/01/2021 from [place]  Attempts:  1st Attempt  Reason for unsuccessful TCM follow-up call:  Left voice message    

## 2021-03-03 ENCOUNTER — Encounter (HOSPITAL_COMMUNITY): Payer: Self-pay | Admitting: Physical Therapy

## 2021-03-03 ENCOUNTER — Ambulatory Visit (HOSPITAL_COMMUNITY): Payer: Medicaid Other | Attending: Orthopaedic Surgery | Admitting: Physical Therapy

## 2021-03-03 ENCOUNTER — Other Ambulatory Visit: Payer: Self-pay

## 2021-03-03 DIAGNOSIS — G8929 Other chronic pain: Secondary | ICD-10-CM | POA: Diagnosis not present

## 2021-03-03 DIAGNOSIS — M545 Low back pain, unspecified: Secondary | ICD-10-CM | POA: Diagnosis not present

## 2021-03-03 DIAGNOSIS — M25561 Pain in right knee: Secondary | ICD-10-CM | POA: Insufficient documentation

## 2021-03-03 DIAGNOSIS — R262 Difficulty in walking, not elsewhere classified: Secondary | ICD-10-CM | POA: Insufficient documentation

## 2021-03-03 DIAGNOSIS — M6281 Muscle weakness (generalized): Secondary | ICD-10-CM

## 2021-03-03 DIAGNOSIS — M25562 Pain in left knee: Secondary | ICD-10-CM | POA: Insufficient documentation

## 2021-03-03 NOTE — Therapy (Signed)
[facility] [address] [place], [place], [address] Phone: [phone]   Fax:  [phone]  Physical Therapy Evaluation  Patient Details  Name: [name] MRN: [id number] Date of Birth: November 30, 1985 Referring Provider (PT): Darreld Mclean MD   Encounter Date: 03/03/2021   PT End of Session - 03/03/21 0933     Visit Number 1    Number of Visits 16    Date for PT Re-Evaluation 04/28/21    Authorization Type medicaid health blue - auth requested    PT Start Time 0915    PT Stop Time 0954    PT Time Calculation (min) 39 min             Past Medical History:  Diagnosis Date   Arthritis    Hypertension    Kidney infection    UTI (urinary tract infection)    Vaginal Pap smear, abnormal     Past Surgical History:  Procedure Laterality Date   BLADDER SURGERY     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    Subjective Assessment - 03/03/21 0930     Subjective States she has bilateral knee pain over the last 3 years. States she has just qualified for Medicaid to get treatment over the last year. Left is worse than her right. States that she has had multiple injections in her knees. States that she has had a recent diagnosis of osteoporosis. Current pain level at rest is 2/10. States she just had her injections last week. States that heat and water helps. States that standing/walking aggravates her knees. States that she has never had PT. 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 Reports bursitis in right hip.    Currently in Pain? Yes    Pain Score 2     Pain Location Knee    Pain Orientation Right;Left    Pain Descriptors / Indicators Aching;Tightness    Pain Type Chronic pain    Aggravating Factors  standing/ walking,    Pain Relieving Factors heat, bathing                [facility] PT Assessment - 03/03/21 0001       Assessment   Medical  Diagnosis B knee pain    Referring Provider (PT) Darreld Mclean MD    Prior Therapy no      Balance Screen   Has the patient fallen in the past 6 months No      Prior Function   Level of Independence Independent    Vocation Full time employment    Vocation Requirements at assisted living facilty      Cognition   Overall Cognitive Status Within Functional Limits for tasks assessed      Observation/Other Assessments   Observations severe balgus bilaterally, hyperextends knee with walking    Focus on Therapeutic Outcomes (FOTO)  NA      ROM / Strength   AROM / PROM / Strength AROM;Strength      AROM   AROM Assessment Site Knee    Right/Left Knee Right;Left    Right Knee Extension 2   hyperextension   Right Knee Flexion 109   pulling along medial side of knee   Left Knee Extension 4   hyperextension, pain in back   Left Knee Flexion 100   pain along medial knee  Strength   Strength Assessment Site Hip;Knee;Ankle    Right/Left Hip Right;Left    Right Hip Extension 3-/5    Right Hip ABduction 2+/5    Left Hip Extension 3-/5    Left Hip ABduction 3+/5    Right/Left Knee Right;Left    Right Knee Flexion 3+/5   hurt back   Right Knee Extension 3+/5   painin knee   Left Knee Flexion 3+/5    Left Knee Extension 4-/5   pain in knee   Right/Left Ankle Right;Left    Right Ankle Dorsiflexion 5/5    Left Ankle Dorsiflexion 5/5                        Objective measurements completed on examination: See above findings.       [facility] Adult PT Treatment/Exercise - 03/03/21 0001       Exercises   Exercises Knee/Hip      Knee/Hip Exercises: Sidelying   Clams 2x10 B                     PT Education - 03/03/21 0933     Education Details Educated on difference between Osteoporosis and osteoarthritis. on PT and how it can help, anticipated progress in 6-8 weeks and plan moving forward    Person(s) Educated Patient    Comprehension Verbalized  understanding              PT Short Term Goals - 03/03/21 0935       PT SHORT TERM GOAL #1   Title Patient will be independent in self management strategies to improve quality of life and functional outcomes.    Time 4    Period Weeks    Status New    Target Date 03/31/21      PT SHORT TERM GOAL #2   Title Patient will report at least 25% improvement in overall symptoms and/or function to demonstrate improved functional mobility    Time 4    Period Weeks    Status New    Target Date 03/31/21      PT SHORT TERM GOAL #3   Title Patient will be able to demosntrate at least 3+/5 MMT in bilateral hips    Time 4    Period Weeks    Status New    Target Date 03/31/21               PT Long Term Goals - 03/03/21 0935       PT LONG TERM GOAL #1   Title Patient will report at least 50% improvement in overall symptoms and/or function to demonstrate improved functional mobility    Time 8    Period Weeks    Status New    Target Date 04/28/21      PT LONG TERM GOAL #2   Title Patient will be able to demonstrate at least 110 degrees in knee flexion bilaterally    Time 8    Period Weeks    Status New    Target Date 04/28/21      PT LONG TERM GOAL #3   Title Patient will report adherance to HEP at least 5x/week    Time 8    Period Weeks    Target Date 04/28/21                    Plan - 03/03/21 1145     Clinical Impression Statement Patient is  a 35 y.o. female who presents to physical therapy with complaint of bilateral knee pain. Patient demonstrates decreased strength, ROM restriction, balance deficits and gait abnormalities which are likely contributing to symptoms of pain and are negatively impacting patient ability to perform ADLs and functional mobility tasks. Patient will benefit from skilled physical therapy services to address these deficits to reduce pain, improve level of function with ADLs, functional mobility tasks, and reduce risk for falls.     Personal Factors and Comorbidities Comorbidity 1;Comorbidity 2;Comorbidity 3+;Fitness    Comorbidities chronic B knee pain, R hip pain, back pain, obesity    Examination-Activity Limitations Squat;Locomotion Level;Bend;Stand;Stairs    Examination-Participation Restrictions Shop;Community Activity;Cleaning;Occupation    Stability/Clinical Decision Making Stable/Uncomplicated    Clinical Decision Making Low    Rehab Potential Fair    PT Frequency 2x / week    PT Duration 8 weeks    PT Treatment/Interventions ADLs/Self Care Home Management;Therapeutic exercise;Therapeutic activities;Stair training;Gait training;Neuromuscular re-education;Patient/family education;Manual techniques    PT Next Visit Plan hip and LE strengthening - table exercises to start as patient hyperextends knees in WB- knee and hip ROM    PT Home Exercise Plan clamshells    Consulted and Agree with Plan of Care Patient             Patient will benefit from skilled therapeutic intervention in order to improve the following deficits and impairments:  Decreased endurance, Decreased mobility, Difficulty walking, Decreased range of motion, Decreased strength, Pain, Decreased activity tolerance, Abnormal gait  Visit Diagnosis: Chronic pain of both knees  Muscle weakness (generalized)  Difficulty in walking, not elsewhere classified     Problem List Patient Active Problem List   Diagnosis Date Noted   Morbid obesity (HCC) 08/25/2020   Chronic hypertension 11/08/2019   History of cesarean delivery 08/01/2019   BMI 50.0-59.9, adult (HCC) 08/01/2019   IUFD at less than 20 weeks of gestation 07/31/2019   Marijuana use 06/21/2019   Smoker 06/20/2019   Abnormal Pap smear of cervix 06/20/2019   Depression with anxiety 06/20/2019    11:53 AM, 03/03/21 Tereasa Coop, DPT Physical Therapy with [facility]  [phone] office   [facility] [address] [place], [place], [address] Phone: [phone]   Fax:  [phone]  Name: [name] MRN: [id number] Date of Birth: 12-17-1985

## 2021-03-03 NOTE — Telephone Encounter (Signed)
Transition Care Management Follow-up Telephone Call Date of discharge and from where: 03/01/2021 from [facility] How have you been since you were released from the hospital? Pt stated that she is feeling some better. Pt stated that she was able to pick up the cyclobenzaprine. Pt stated that she did not pick up the meloxicam because it did not work in the past and insurance did not pay for it.  Any questions or concerns? No  Items Reviewed: Did the pt receive and understand the discharge instructions provided? Yes  Medications obtained and verified? Yes  Other? No  Any new allergies since your discharge? No  Dietary orders reviewed? No Do you have support at home? Yes   Functional Questionnaire: (I = Independent and D = Dependent) ADLs: I  Bathing/Dressing- I  Meal Prep- I  Eating- I  Maintaining continence- I  Transferring/Ambulation- I  Managing Meds- I   Follow up appointments reviewed:  PCP Hospital f/u appt confirmed? No   Specialist Hospital f/u appt confirmed? Yes  Scheduled to see Darreld Mclean, MD on 03/05/2021 @ 8:20am. Are transportation arrangements needed? No  If their condition worsens, is the pt aware to call PCP or go to the Emergency Dept.? Yes Was the patient provided with contact information for the PCP's office or ED? Yes Was to pt encouraged to call back with questions or concerns? Yes

## 2021-03-05 ENCOUNTER — Encounter: Payer: Self-pay | Admitting: Orthopaedic Surgery

## 2021-03-05 ENCOUNTER — Other Ambulatory Visit: Payer: Self-pay

## 2021-03-05 ENCOUNTER — Ambulatory Visit (INDEPENDENT_AMBULATORY_CARE_PROVIDER_SITE_OTHER): Payer: Medicaid Other | Admitting: Orthopaedic Surgery

## 2021-03-05 VITALS — BP 148/81 | HR 98 | Ht 66.0 in | Wt 306.0 lb

## 2021-03-05 DIAGNOSIS — G8929 Other chronic pain: Secondary | ICD-10-CM

## 2021-03-05 DIAGNOSIS — Z6841 Body Mass Index (BMI) 40.0 and over, adult: Secondary | ICD-10-CM | POA: Diagnosis not present

## 2021-03-05 DIAGNOSIS — M5440 Lumbago with sciatica, unspecified side: Secondary | ICD-10-CM | POA: Diagnosis not present

## 2021-03-05 MED ORDER — CYCLOBENZAPRINE HCL 10 MG PO TABS: 10.0000 mg | ORAL_TABLET | Freq: Every day | ORAL | 0 refills | Status: DC

## 2021-03-05 MED ORDER — HYDROCODONE-ACETAMINOPHEN 5-325 MG PO TABS
ORAL_TABLET | ORAL | 0 refills | Status: DC
Start: 2021-03-05 — End: 2021-03-19

## 2021-03-05 NOTE — Patient Instructions (Addendum)
Note for work with restrictions: no lifting more than 20 lbs and no long standing.

## 2021-03-05 NOTE — Progress Notes (Signed)
My back really hurts.  She has lower back pain, more on the left, with no radiation for the last several weeks.  She was lifting a wheelchair into a company car when the pain occurred.  She has seen Dr. Felecia Shelling on 02-24-21 and went to the ER for the same problem on 03-01-21.  I have reviewed the notes. She has had X-rays of the lumbar spine.  She is not improving.  She is taking naprosyn and was given Flexeril from the ER.  She has pain medicine.  She is worse and is concerned.  Spine/Pelvis examination:  Inspection:  Overall, sacoiliac joint benign and hips nontender; without crepitus or defects.   Thoracic spine inspection: Alignment normal without kyphosis present   Lumbar spine inspection:  Alignment  with normal lumbar lordosis, without scoliosis apparent.   Thoracic spine palpation:  without tenderness of spinal processes   Lumbar spine palpation: without tenderness of lumbar area; without tightness of lumbar muscles    Range of Motion:   Lumbar flexion, forward flexion is normal without pain or tenderness    Lumbar extension is full without pain or tenderness   Left lateral bend is normal without pain or tenderness   Right lateral bend is normal without pain or tenderness   Straight leg raising is normal  Strength & tone: normal   Stability overall normal stability  Encounter Diagnoses  Name Primary?   Chronic left-sided low back pain with sciatica, sciatica laterality unspecified Yes   Morbid obesity (HCC)    Body mass index 45.0-49.9, adult (HCC)    I will renew the pain medicine and the Flexeril.  I will schedule PT for the lumbar area and have her do exercises for this.  She may need MRI.  I have gone over precautions for her and given her a note for work restrictions of no lifting over 20 pounds and no prolonged standing.  Return in three weeks.  Call if any problem.  Precautions discussed.  Electronically Signed Darreld Mclean, MD 11/17/20229:14 AM

## 2021-03-09 ENCOUNTER — Ambulatory Visit (HOSPITAL_COMMUNITY): Payer: Medicaid Other | Admitting: Physical Therapy

## 2021-03-09 ENCOUNTER — Encounter (HOSPITAL_COMMUNITY): Payer: Self-pay | Admitting: Physical Therapy

## 2021-03-09 ENCOUNTER — Other Ambulatory Visit: Payer: Self-pay

## 2021-03-09 DIAGNOSIS — G8929 Other chronic pain: Secondary | ICD-10-CM | POA: Diagnosis not present

## 2021-03-09 DIAGNOSIS — M25561 Pain in right knee: Secondary | ICD-10-CM | POA: Diagnosis not present

## 2021-03-09 DIAGNOSIS — M6281 Muscle weakness (generalized): Secondary | ICD-10-CM

## 2021-03-09 DIAGNOSIS — M545 Low back pain, unspecified: Secondary | ICD-10-CM

## 2021-03-09 DIAGNOSIS — R262 Difficulty in walking, not elsewhere classified: Secondary | ICD-10-CM | POA: Diagnosis not present

## 2021-03-09 DIAGNOSIS — M25562 Pain in left knee: Secondary | ICD-10-CM | POA: Diagnosis not present

## 2021-03-09 NOTE — Patient Instructions (Signed)
Access Code: [email or web address] URL: [email or web address] Date: 03/09/2021 Prepared by: [facility] Karnisha Lefebre  Exercises [employer] Up - 5 x daily - 7 x weekly - 2 sets - 10 reps

## 2021-03-09 NOTE — Therapy (Signed)
[facility] [address] [place], [place], [address] Phone: [phone]   Fax:  [phone]  Physical Therapy Treatment  Patient Details  Name: [name] MRN: [id number] Date of Birth: [birth year] Referring Provider (PT): Darreld Mclean MD   Encounter Date: 03/09/2021   PT End of Session - 03/09/21 1359     Visit Number 2    Number of Visits 16    Date for PT Re-Evaluation 04/28/21    Authorization Type medicaid health blue - auth requested    PT Start Time 1400    PT Stop Time 1442    PT Time Calculation (min) 42 min    Activity Tolerance Patient tolerated treatment well    Behavior During Therapy [facility] for tasks assessed/performed             Past Medical History:  Diagnosis Date   Arthritis    Hypertension    Kidney infection    UTI (urinary tract infection)    Vaginal Pap smear, abnormal     Past Surgical History:  Procedure Laterality Date   BLADDER SURGERY     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   Subjective Assessment - 03/09/21 1400     Subjective Her back has been bothering her and they sent a referral in for her back. Back pain increases with standing, first thing in morning worse. Pain decreases with sitting, patches which only temporarily decreases symptoms. Symptoms have been doing on for about 3 weeks with insidious onset. She notices increase in symptoms following lifting a wheelchair.    Currently in Pain? Yes    Pain Score 4     Pain Location Back    Pain Orientation Lower;Left    Pain Descriptors / Indicators Other (Comment)   stinging   Pain Type Acute pain    Pain Onset 1 to 4 weeks ago    Pain Frequency Constant                [facility] PT Assessment - 03/09/21 0001       Assessment   Medical Diagnosis B knee pain/Lumbar pain    Referring Provider (PT) Darreld Mclean MD      Prior Function   Level  of Independence Independent    Vocation Full time employment    Vocation Requirements at assisted living facilty      Cognition   Overall Cognitive Status Within Functional Limits for tasks assessed      Observation/Other Assessments   Observations severe balgus bilaterally, hyperextends knee with walking    Focus on Therapeutic Outcomes (FOTO)  NA      AROM   Overall AROM Comments c/o pulling/pain with all but flexion, flexion felt good    AROM Assessment Site Lumbar    Lumbar Flexion 0% limited    Lumbar Extension 25% limited    Lumbar - Right Side Bend 50% limited    Lumbar - Left Side Bend 50% limited    Lumbar - Right Rotation 50% limited    Lumbar - Left Rotation 50% limited      Palpation   Palpation comment TTP lower lumbar paraspinals                           [facility] Adult PT Treatment/Exercise - 03/09/21 0001  Knee/Hip Exercises: Stretches   Other Knee/Hip Stretches POE 3x 30 second holds, press up 2x 10                     PT Education - 03/09/21 1400     Education Details HEP, imaging, muscle strains, lumbar findings, lumbar roll, posture    Person(s) Educated Patient    Methods Explanation;Demonstration;Handout    Comprehension Verbalized understanding;Returned demonstration              PT Short Term Goals - 03/09/21 1445       PT SHORT TERM GOAL #1   Title Patient will be independent in self management strategies to improve quality of life and functional outcomes.    Time 4    Period Weeks    Status On-going    Target Date 03/31/21      PT SHORT TERM GOAL #2   Title Patient will report at least 25% improvement in overall symptoms and/or function to demonstrate improved functional mobility    Time 4    Period Weeks    Status On-going    Target Date 03/31/21      PT SHORT TERM GOAL #3   Title Patient will be able to demosntrate at least 3+/5 MMT in bilateral hips    Time 4    Period Weeks    Status  On-going    Target Date 03/31/21      PT SHORT TERM GOAL #4   Title Patient will demonstrate lumbar ROM WFL with symptoms less than 1/10 for improved ability to work.    Time 4    Period Weeks    Status New    Target Date 03/31/21               PT Long Term Goals - 03/09/21 1446       PT LONG TERM GOAL #1   Title Patient will report at least 50% improvement in overall symptoms and/or function to demonstrate improved functional mobility    Time 8    Period Weeks    Status On-going      PT LONG TERM GOAL #2   Title Patient will be able to demonstrate at least 110 degrees in knee flexion bilaterally    Time 8    Period Weeks    Status On-going      PT LONG TERM GOAL #3   Title Patient will report adherance to HEP at least 5x/week    Time 8    Period Weeks    Status On-going                   Plan - 03/09/21 1359     Clinical Impression Statement Patient with referral for back as it has been bothering her for last couple weeks. Further testing reveals lumbar ROM restrictions and tender musculature. Will add lumbar spine to current POC.  Trialed extension based exercises with decrease in symptoms following. Educated and used lumbar roll. Symptoms decreased to 1/10 at end of session. New goal added for lumbar spine. Patient will continue to benefit from skilled physical therapy in order to reduce impairment and improve function.    Personal Factors and Comorbidities Comorbidity 1;Comorbidity 2;Comorbidity 3+;Fitness    Comorbidities chronic B knee pain, R hip pain, back pain, obesity    Examination-Activity Limitations Squat;Locomotion Level;Bend;Stand;Stairs    Examination-Participation Restrictions Shop;Community Activity;Cleaning;Occupation    Stability/Clinical Decision Making Stable/Uncomplicated    Rehab Potential Fair  PT Frequency 2x / week    PT Duration 8 weeks    PT Treatment/Interventions ADLs/Self Care Home Management;Therapeutic  exercise;Therapeutic activities;Stair training;Gait training;Neuromuscular re-education;Patient/family education;Manual techniques    PT Next Visit Plan f/u with press up; core strength; hip and LE strengthening - table exercises to start as patient hyperextends knees in WB- knee and hip ROM    PT Home Exercise Plan clamshells 11/21 press up, lumbar roll    Consulted and Agree with Plan of Care Patient             Patient will benefit from skilled therapeutic intervention in order to improve the following deficits and impairments:  Decreased endurance, Decreased mobility, Difficulty walking, Decreased range of motion, Decreased strength, Pain, Decreased activity tolerance, Abnormal gait  Visit Diagnosis: Chronic pain of both knees  Muscle weakness (generalized)  Difficulty in walking, not elsewhere classified  Low back pain, unspecified back pain laterality, unspecified chronicity, unspecified whether sciatica present     Problem List Patient Active Problem List   Diagnosis Date Noted   Morbid obesity (HCC) 08/25/2020   Chronic hypertension 11/08/2019   History of cesarean delivery 08/01/2019   BMI 50.0-59.9, adult (HCC) 08/01/2019   IUFD at less than 20 weeks of gestation 07/31/2019   Marijuana use 06/21/2019   Smoker 06/20/2019   Abnormal Pap smear of cervix 06/20/2019   Depression with anxiety 06/20/2019    2:48 PM, 03/09/21 Wyman Songster PT, DPT Physical Therapist at [facility]   [facility] [address] [place], [place], [address] Phone: [phone]   Fax:  [phone]  Name: [name] MRN: [id number] Date of Birth: April 21, 1985

## 2021-03-11 ENCOUNTER — Ambulatory Visit (HOSPITAL_COMMUNITY): Payer: Medicaid Other | Admitting: Physical Therapy

## 2021-03-11 ENCOUNTER — Encounter (HOSPITAL_COMMUNITY): Payer: Self-pay | Admitting: Physical Therapy

## 2021-03-11 ENCOUNTER — Other Ambulatory Visit: Payer: Self-pay

## 2021-03-11 DIAGNOSIS — M545 Low back pain, unspecified: Secondary | ICD-10-CM | POA: Diagnosis not present

## 2021-03-11 DIAGNOSIS — G8929 Other chronic pain: Secondary | ICD-10-CM | POA: Diagnosis not present

## 2021-03-11 DIAGNOSIS — M25561 Pain in right knee: Secondary | ICD-10-CM | POA: Diagnosis not present

## 2021-03-11 DIAGNOSIS — M6281 Muscle weakness (generalized): Secondary | ICD-10-CM

## 2021-03-11 DIAGNOSIS — R262 Difficulty in walking, not elsewhere classified: Secondary | ICD-10-CM | POA: Diagnosis not present

## 2021-03-11 DIAGNOSIS — M25562 Pain in left knee: Secondary | ICD-10-CM | POA: Diagnosis not present

## 2021-03-11 NOTE — Therapy (Signed)
[facility] [address] [place], [place], [address] Phone: [phone]   Fax:  [phone]  Physical Therapy Treatment  Patient Details  Name: [name] MRN: [id number] Date of Birth: [birth year] Referring Provider (PT): Darreld Mclean MD   Encounter Date: 03/11/2021   PT End of Session - 03/11/21 1312     Visit Number 3    Number of Visits 16    Date for PT Re-Evaluation 04/28/21    Authorization Type medicaid health blue - auth requested    PT Start Time 1315    PT Stop Time 1356    PT Time Calculation (min) 41 min    Activity Tolerance Patient tolerated treatment well    Behavior During Therapy [facility] for tasks assessed/performed             Past Medical History:  Diagnosis Date   Arthritis    Hypertension    Kidney infection    UTI (urinary tract infection)    Vaginal Pap smear, abnormal     Past Surgical History:  Procedure Laterality Date   BLADDER SURGERY     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   Subjective Assessment - 03/11/21 1321     Subjective States that she felt like the press ups helped in clinic but then when she tried them in bed in the morning and then again at the end of the day on the floor- felt like pain was worse afterwards. Knees feel pretty good today. Current back pain is 4/10 and knee pain is 2/10    Currently in Pain? Yes    Pain Score 4     Pain Location Back    Pain Orientation Lower    Pain Descriptors / Indicators Aching;Tightness    Pain Onset 1 to 4 weeks ago                [facility] PT Assessment - 03/11/21 0001       Assessment   Medical Diagnosis B knee pain/Lumbar pain    Referring Provider (PT) Darreld Mclean MD                           [facility] Adult PT Treatment/Exercise - 03/11/21 0001       Knee/Hip Exercises: Stretches   Other Knee/Hip Stretches SKC 3 minutes  total - alternating    Other Knee/Hip Stretches Lower trunk rotations - 3 minutes total      Knee/Hip Exercises: Supine   Heel Slides AROM;Both;15 reps    Bridges AROM;10 reps;3 sets   3" holds   Straight Leg Raises Strengthening;Both;4 sets;5 reps   with quad set, slow lower   Other Supine Knee/Hip Exercises hamstring iso on ball x2 1 minute with 5" holds    Other Supine Knee/Hip Exercises deadbugs with green ball 4x5 5" holds      Knee/Hip Exercises: Prone   Other Prone Exercises prone lying <-> prone on elbows - transitioning with prolonged static holds - not tolerated well - 3 times in each position 30-60 second holds                       PT Short Term Goals - 03/09/21 1445       PT SHORT TERM GOAL #1   Title Patient  will be independent in self management strategies to improve quality of life and functional outcomes.    Time 4    Period Weeks    Status On-going    Target Date 03/31/21      PT SHORT TERM GOAL #2   Title Patient will report at least 25% improvement in overall symptoms and/or function to demonstrate improved functional mobility    Time 4    Period Weeks    Status On-going    Target Date 03/31/21      PT SHORT TERM GOAL #3   Title Patient will be able to demosntrate at least 3+/5 MMT in bilateral hips    Time 4    Period Weeks    Status On-going    Target Date 03/31/21      PT SHORT TERM GOAL #4   Title Patient will demonstrate lumbar ROM WFL with symptoms less than 1/10 for improved ability to work.    Time 4    Period Weeks    Status New    Target Date 03/31/21               PT Long Term Goals - 03/09/21 1446       PT LONG TERM GOAL #1   Title Patient will report at least 50% improvement in overall symptoms and/or function to demonstrate improved functional mobility    Time 8    Period Weeks    Status On-going      PT LONG TERM GOAL #2   Title Patient will be able to demonstrate at least 110 degrees in knee flexion  bilaterally    Time 8    Period Weeks    Status On-going      PT LONG TERM GOAL #3   Title Patient will report adherance to HEP at least 5x/week    Time 8    Period Weeks    Status On-going                   Plan - 03/11/21 1353     Clinical Impression Statement Patient did not tolerate prone press ups/lumbar extensions on this date but did tolerate flexion based exercises at alleviating her symptoms. 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 Able to progress table exercises with good tolerance, added all new exercises to HEP. Patient reported "lighter in her legs" end of session with reduced pain in knees. Will continue with current POC as tolerated.    Personal Factors and Comorbidities Comorbidity 1;Comorbidity 2;Comorbidity 3+;Fitness    Comorbidities chronic B knee pain, R hip pain, back pain, obesity    Examination-Activity Limitations Squat;Locomotion Level;Bend;Stand;Stairs    Examination-Participation Restrictions Shop;Community Activity;Cleaning;Occupation    Stability/Clinical Decision Making Stable/Uncomplicated    Rehab Potential Fair    PT Frequency 2x / week    PT Duration 8 weeks    PT Treatment/Interventions ADLs/Self Care Home Management;Therapeutic exercise;Therapeutic activities;Stair training;Gait training;Neuromuscular re-education;Patient/family education;Manual techniques    PT Next Visit Plan lumbar mobility and core strenghtenign, hip and LE strengthening - table exercises to start as patient hyperextends knees in WB- knee and hip ROM    PT Home Exercise Plan clamshells 11/21 press up, lumbar roll; 11/23 hamstring iso on ball, deadbug iso with ball, LTR, SKC, bridge, 90/90 relief position    Consulted and Agree with Plan of Care Patient             Patient will benefit from skilled therapeutic  intervention in order to improve the following deficits and impairments:  Decreased  endurance, Decreased mobility, Difficulty walking, Decreased range of motion, Decreased strength, Pain, Decreased activity tolerance, Abnormal gait  Visit Diagnosis: Chronic pain of both knees  Muscle weakness (generalized)  Difficulty in walking, not elsewhere classified  Low back pain, unspecified back pain laterality, unspecified chronicity, unspecified whether sciatica present     Problem List Patient Active Problem List   Diagnosis Date Noted   Morbid obesity (HCC) 08/25/2020   Chronic hypertension 11/08/2019   History of cesarean delivery 08/01/2019   BMI 50.0-59.9, adult (HCC) 08/01/2019   IUFD at less than 20 weeks of gestation 07/31/2019   Marijuana use 06/21/2019   Smoker 06/20/2019   Abnormal Pap smear of cervix 06/20/2019   Depression with anxiety 06/20/2019    1:59 PM, 03/11/21 Tereasa Coop, DPT Physical Therapy with [facility]  [phone] office   [facility] [address] [place], [place], [address] Phone: [phone]   Fax:  [phone]  Name: [name] MRN: [id number] Date of Birth: [birth year]

## 2021-03-11 NOTE — Patient Instructions (Signed)
Access Code: [email or web address] URL: [email or web address] Date: 03/11/2021 Prepared by: Revonda Humphrey  Exercises Supine Bridge - 1 x daily - 7 x weekly - 3 sets - 10 reps Hooklying Single Knee to Chest Supine Lower Trunk Rotation Active Straight Leg Raise with Quad Set - 4 sets - 5 reps Isometric Dead Bug - 4 sets - 5 reps - 5 hold Supine Gluteus and Hamstring Sets on Swiss Ball - 3 sets - 10 reps - 5 hold Supine with Legs Supported at 90/90

## 2021-03-16 ENCOUNTER — Encounter (HOSPITAL_COMMUNITY): Payer: Self-pay

## 2021-03-16 ENCOUNTER — Emergency Department (HOSPITAL_COMMUNITY)
Admission: EM | Admit: 2021-03-16 | Discharge: 2021-03-16 | Disposition: A | Discharge status: Home / Self Care | Payer: Medicaid Other | Attending: Emergency Medicine | Admitting: Emergency Medicine

## 2021-03-16 ENCOUNTER — Ambulatory Visit (HOSPITAL_COMMUNITY): Payer: Medicaid Other | Admitting: Physical Therapy

## 2021-03-16 ENCOUNTER — Other Ambulatory Visit: Payer: Self-pay

## 2021-03-16 DIAGNOSIS — Z87891 Personal history of nicotine dependence: Secondary | ICD-10-CM | POA: Insufficient documentation

## 2021-03-16 DIAGNOSIS — M545 Low back pain, unspecified: Secondary | ICD-10-CM | POA: Insufficient documentation

## 2021-03-16 DIAGNOSIS — I10 Essential (primary) hypertension: Secondary | ICD-10-CM | POA: Insufficient documentation

## 2021-03-16 DIAGNOSIS — Z79899 Other long term (current) drug therapy: Secondary | ICD-10-CM | POA: Diagnosis not present

## 2021-03-16 MED ORDER — KETOROLAC TROMETHAMINE 30 MG/ML IJ SOLN
30.0000 mg | Freq: Once | INTRAMUSCULAR | Status: AC
  Administered 2021-03-16: 08:00:00 30 mg via INTRAMUSCULAR
  Filled 2021-03-16: qty 1

## 2021-03-16 MED ORDER — PREDNISONE 20 MG PO TABS: 40.0000 mg | ORAL_TABLET | Freq: Every day | ORAL | 0 refills | Status: DC

## 2021-03-16 MED ORDER — PREDNISONE 50 MG PO TABS
60.0000 mg | ORAL_TABLET | Freq: Once | ORAL | Status: AC
  Administered 2021-03-16: 08:00:00 60 mg via ORAL
  Filled 2021-03-16: qty 1

## 2021-03-16 NOTE — Discharge Instructions (Addendum)
Follow-up with Dr. Hilda Lias.  Hopefully he can help with chest pain control also.  The steroids hopefully can help.

## 2021-03-16 NOTE — ED Triage Notes (Signed)
Pt arrived via POV w c/o back pain x 3 weeks. States that her Dr told here she needs an MRI, but her insurance won't pay for it without physical therapy first. Pt states that she has finished first week of PT and is hurting even worse down the left side of her back and hip.

## 2021-03-16 NOTE — ED Provider Notes (Signed)
[facility] EMERGENCY DEPARTMENT Provider Note   CSN: [id number] Arrival date & time: 03/16/21  4081     History Chief Complaint  Patient presents with   Back Pain    [name] is a 35 y.o. female.   Back Pain Associated symptoms: no abdominal pain and no weakness   Patient presents with low back pain going down the left leg.  Help around 3 weeks now.  No definite injury but did have increasing pain when she lifted a wheelchair into the trunk of a car for work.  Pain is been severe.  Has seen Dr. Hilda Lias.  Has been on muscle relaxers and NSAIDs without relief.  Has had physical therapy but states it is just making the pain worse.  No fevers or chills.  No cancer history.  No IV drug use.  No loss of bladder or bowel control.  No rash.    Past Medical History:  Diagnosis Date   Arthritis    Hypertension    Kidney infection    UTI (urinary tract infection)    Vaginal Pap smear, abnormal     Patient Active Problem List   Diagnosis Date Noted   Morbid obesity (HCC) 08/25/2020   Chronic hypertension 11/08/2019   History of cesarean delivery 08/01/2019   BMI 50.0-59.9, adult (HCC) 08/01/2019   IUFD at less than 20 weeks of gestation 07/31/2019   Marijuana use 06/21/2019   Smoker 06/20/2019   Abnormal Pap smear of cervix 06/20/2019   Depression with anxiety 06/20/2019    Past Surgical History:  Procedure Laterality Date   BLADDER SURGERY     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   CESAREAN SECTION     COLPOSCOPY       OB History     Gravida  5   Para  3   Term  3   Preterm      AB  2   Living  3      SAB      IAB      Ectopic      Multiple      Live Births  3           Family History  Problem Relation Age of Onset   Cancer Other    Diabetes Other    Thyroid disease Mother    Hypertension Mother    Heart attack Mother    Cancer Maternal Grandmother    Asthma Maternal Grandmother    Arthritis Maternal Grandmother    Cancer  Maternal Grandfather    Diabetes Maternal Grandfather    Thyroid disease Maternal Aunt     Social History   Tobacco Use   Smoking status: Former    Packs/day: 0.25    Years: 2.00    Pack years: 0.50    Types: Cigarettes    Quit date: 12/09/2020    Years since quitting: 0.2   Smokeless tobacco: Never   Tobacco comments:    "2-3 cigs per day"  Vaping Use   Vaping Use: Never used  Substance Use Topics   Alcohol use: No   Drug use: No    Home Medications Prior to Admission medications   Medication Sig Start Date End Date Taking? Authorizing Provider  predniSONE (DELTASONE) 20 MG tablet Take 2 tablets (40 mg total) by mouth daily. Take first dose on 11/29. 03/16/21  Yes Benjiman Core, MD  acetaminophen (TYLENOL) 325 MG tablet Take 650 mg by mouth every 6 (six) hours  as needed.    [provider]  cyclobenzaprine (FLEXERIL) 10 MG tablet Take 1 tablet (10 mg total) by mouth at bedtime. One tablet every night at bedtime as needed for spasm. 03/05/21   Darreld Mclean, MD  hydrochlorothiazide (HYDRODIURIL) 12.5 MG tablet Take 12.5 mg by mouth daily. 08/20/20   [provider]  HYDROcodone-acetaminophen (NORCO/VICODIN) 5-325 MG tablet One tablet every six hours for pain.  Limit 7 days. 03/05/21   Darreld Mclean, MD  meloxicam (MOBIC) 7.5 MG tablet Take 1 tablet (7.5 mg total) by mouth daily. 03/01/21   Al Decant, PA  metFORMIN (GLUCOPHAGE XR) 500 MG 24 hr tablet Take 1 tablet (500 mg total) by mouth daily with breakfast. 08/25/20   Nida, Denman George, MD  naproxen (NAPROSYN) 500 MG tablet Take 1 tablet (500 mg total) by mouth 2 (two) times daily with a meal. 02/23/21   Darreld Mclean, MD    Allergies    Bee venom  Review of Systems   Review of Systems  Constitutional:  Negative for appetite change.  HENT:  Negative for congestion.   Respiratory:  Negative for shortness of breath.   Cardiovascular:  Negative for leg swelling.  Gastrointestinal:   Negative for abdominal pain.  Musculoskeletal:  Positive for back pain and gait problem. Negative for neck pain.  Skin:  Negative for rash and wound.  Neurological:  Negative for weakness.  Psychiatric/Behavioral:  Negative for confusion.    Physical Exam Updated Vital Signs BP 135/90   Pulse 73   Temp 97.7 F (36.5 C) (Oral)   Resp 17   Ht 5\' 6"  (1.676 m)   Wt 136.1 kg   LMP 02/05/2021 (Approximate)   SpO2 94%   BMI 48.42 kg/m   Physical Exam Vitals and nursing note reviewed.  Constitutional:      Appearance: She is obese.  Eyes:     Pupils: Pupils are equal, round, and reactive to light.  Cardiovascular:     Rate and Rhythm: Regular rhythm.  Pulmonary:     Breath sounds: No wheezing.  Abdominal:     Tenderness: There is no abdominal tenderness.  Musculoskeletal:        General: Tenderness present.     Comments: Lumbar spinal and paraspinal tenderness.  Also left posterior pelvic/hip pain.  No deformity.  Pain with straight leg raise on left.  Better straight leg raise on right.  Neurovascular intact in bilateral feet.  Neurological:     Mental Status: She is alert.    ED Results / Procedures / Treatments   Labs (all labs ordered are listed, but only abnormal results are displayed) Labs Reviewed - No data to display  EKG None  Radiology No results found.  Procedures Procedures   Medications Ordered in ED Medications  ketorolac (TORADOL) 30 MG/ML injection 30 mg (30 mg Intramuscular Given 03/16/21 0732)  predniSONE (DELTASONE) tablet 60 mg (60 mg Oral Given 03/16/21 0731)    ED Course  I have reviewed the triage vital signs and the nursing notes.  Pertinent labs & imaging results that were available during my care of the patient were reviewed by me and considered in my medical decision making (see chart for details).    MDM Rules/Calculators/A&P                           Patient with left-sided low back pain going down the leg.  Doubt acute  surgical intervention will be  needed but patient is definitely uncomfortable.  Has not been on steroids.  She is at higher risk with her body habitus and diabetes but I think there is nothing else has been working it is worth the risk.  We will also add some stronger pain medicines.  Also sees Dr. Hilda Lias.  We will discharge home. Final Clinical Impression(s) / ED Diagnoses Final diagnoses:  Acute low back pain, unspecified back pain laterality, unspecified whether sciatica present    Rx / DC Orders ED Discharge Orders          Ordered    predniSONE (DELTASONE) 20 MG tablet  Daily        03/16/21 0805             Benjiman Core, MD 03/16/21 [phone]

## 2021-03-19 ENCOUNTER — Encounter: Payer: Self-pay | Admitting: Orthopaedic Surgery

## 2021-03-19 ENCOUNTER — Other Ambulatory Visit: Payer: Self-pay | Admitting: "Endocrinology

## 2021-03-19 ENCOUNTER — Other Ambulatory Visit: Payer: Self-pay

## 2021-03-19 ENCOUNTER — Ambulatory Visit (INDEPENDENT_AMBULATORY_CARE_PROVIDER_SITE_OTHER): Payer: Medicaid Other | Admitting: Orthopaedic Surgery

## 2021-03-19 DIAGNOSIS — M25562 Pain in left knee: Secondary | ICD-10-CM | POA: Diagnosis not present

## 2021-03-19 DIAGNOSIS — M25561 Pain in right knee: Secondary | ICD-10-CM

## 2021-03-19 DIAGNOSIS — G8929 Other chronic pain: Secondary | ICD-10-CM

## 2021-03-19 DIAGNOSIS — Z6841 Body Mass Index (BMI) 40.0 and over, adult: Secondary | ICD-10-CM

## 2021-03-19 MED ORDER — HYDROCODONE-ACETAMINOPHEN 5-325 MG PO TABS
ORAL_TABLET | ORAL | 0 refills | Status: DC
Start: 2021-03-19 — End: 2021-04-21

## 2021-03-19 NOTE — Progress Notes (Signed)
She went to the ER two days ago for lower back pain.  PROCEDURE NOTE:  The patient requests injections of the left knee , verbal consent was obtained.  The left knee was prepped appropriately after time out was performed.   Sterile technique was observed and injection of 1 cc of DepoMedrol 40 mg with several cc's of plain xylocaine. Anesthesia was provided by ethyl chloride and a 20-gauge needle was used to inject the knee area. The injection was tolerated well.  A band aid dressing was applied.  The patient was advised to apply ice later today and tomorrow to the injection sight as needed.   PROCEDURE NOTE:  The patient requests injections of the right knee , verbal consent was obtained.  The right knee was prepped appropriately after time out was performed.   Sterile technique was observed and injection of 1 cc of DepoMedrol 40mg  with several cc's of plain xylocaine. Anesthesia was provided by ethyl chloride and a 20-gauge needle was used to inject the knee area. The injection was tolerated well.  A band aid dressing was applied.  The patient was advised to apply ice later today and tomorrow to the injection sight as needed.   Encounter Diagnoses  Name Primary?   Chronic pain of both knees Yes   Morbid obesity (HCC)    Body mass index 45.0-49.9, adult (HCC)    Call if any problem.  Precautions discussed.  Return in one month.  I refilled her pain medicine.  Electronically Signed 09-12-1983, MD 12/1/20228:57 AM

## 2021-03-20 ENCOUNTER — Telehealth (HOSPITAL_COMMUNITY): Payer: Self-pay | Admitting: Physical Therapy

## 2021-03-20 ENCOUNTER — Ambulatory Visit (HOSPITAL_COMMUNITY): Payer: Medicaid Other | Attending: Orthopaedic Surgery | Admitting: Physical Therapy

## 2021-03-20 DIAGNOSIS — G8929 Other chronic pain: Secondary | ICD-10-CM | POA: Insufficient documentation

## 2021-03-20 DIAGNOSIS — M25561 Pain in right knee: Secondary | ICD-10-CM | POA: Insufficient documentation

## 2021-03-20 DIAGNOSIS — M6281 Muscle weakness (generalized): Secondary | ICD-10-CM | POA: Insufficient documentation

## 2021-03-20 DIAGNOSIS — M545 Low back pain, unspecified: Secondary | ICD-10-CM | POA: Insufficient documentation

## 2021-03-20 DIAGNOSIS — M25562 Pain in left knee: Secondary | ICD-10-CM | POA: Insufficient documentation

## 2021-03-20 DIAGNOSIS — R262 Difficulty in walking, not elsewhere classified: Secondary | ICD-10-CM | POA: Insufficient documentation

## 2021-03-20 NOTE — Telephone Encounter (Signed)
NS. Called and left a VM about missed apt and about upcoming apt.    1:42 PM, 03/20/21 Tereasa Coop, DPT Physical Therapy with [facility]  [phone] office

## 2021-03-23 ENCOUNTER — Encounter (HOSPITAL_COMMUNITY): Payer: Self-pay | Admitting: Physical Therapy

## 2021-03-23 ENCOUNTER — Ambulatory Visit (HOSPITAL_COMMUNITY): Payer: Medicaid Other | Admitting: Physical Therapy

## 2021-03-23 ENCOUNTER — Other Ambulatory Visit: Payer: Self-pay

## 2021-03-23 DIAGNOSIS — M6281 Muscle weakness (generalized): Secondary | ICD-10-CM

## 2021-03-23 DIAGNOSIS — M25561 Pain in right knee: Secondary | ICD-10-CM | POA: Diagnosis not present

## 2021-03-23 DIAGNOSIS — M25562 Pain in left knee: Secondary | ICD-10-CM | POA: Diagnosis not present

## 2021-03-23 DIAGNOSIS — M545 Low back pain, unspecified: Secondary | ICD-10-CM | POA: Diagnosis not present

## 2021-03-23 DIAGNOSIS — G8929 Other chronic pain: Secondary | ICD-10-CM

## 2021-03-23 DIAGNOSIS — R262 Difficulty in walking, not elsewhere classified: Secondary | ICD-10-CM

## 2021-03-23 NOTE — Therapy (Signed)
[facility] [address] [place], [place], [address] Phone: [phone]   Fax:  [phone]  Physical Therapy Treatment  Patient Details  Name: MERDITH ADAN MRN: [id number] Date of Birth: 09/19/1985 Referring Provider (PT): Darreld Mclean MD   Encounter Date: 03/23/2021   PT End of Session - 03/23/21 1042     Visit Number 4    Number of Visits 16    Date for PT Re-Evaluation 04/28/21    Authorization Type medicaid health blue - auth requested    PT Start Time 1045    PT Stop Time 1125    PT Time Calculation (min) 40 min    Activity Tolerance Patient tolerated treatment well    Behavior During Therapy [facility] for tasks assessed/performed             Past Medical History:  Diagnosis Date   Arthritis    Hypertension    Kidney infection    UTI (urinary tract infection)    Vaginal Pap smear, abnormal     Past Surgical History:  Procedure Laterality Date   BLADDER SURGERY     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   Subjective Assessment - 03/23/21 1043     Subjective Legs have been doing a lot better than normal. Back is doing alright. She went to hospital last week and got a shot and some steroids. She went swimming a few days ago and did some exercises and laps but her back was bothering her after. Lumbar extension was bothersome before but really hasn't been doing them. Got shots in knees recently.    Currently in Pain? Yes    Pain Location Back    Pain Orientation Lower    Pain Descriptors / Indicators Sore    Pain Type Chronic pain                               [facility] Adult PT Treatment/Exercise - 03/23/21 0001       Knee/Hip Exercises: Standing   Hip Abduction Both;2 sets;10 reps      Knee/Hip Exercises: Seated   Long Arc Quad Both;2 sets;10 reps    Long Arc Quad Limitations 5-10  second holds      Knee/Hip  Exercises: Supine   Bridges with Clamshell Both;2 sets;10 reps   green band at knees   Other Supine Knee/Hip Exercises hamstring iso on ball 2x 10 5 second holds      Knee/Hip Exercises: Sidelying   Clams 2x 15 bilateral with green band at knees      Knee/Hip Exercises: Prone   Hip Extension Both;2 sets;10 reps                     PT Education - 03/23/21 1042     Education Details HEP    Person(s) Educated Patient    Methods Explanation;Demonstration    Comprehension Verbalized understanding;Returned demonstration              PT Short Term Goals - 03/09/21 1445       PT SHORT TERM GOAL #1   Title Patient will be independent in self management strategies to improve quality of life and functional outcomes.    Time 4    Period Weeks    Status On-going  Target Date 03/31/21      PT SHORT TERM GOAL #2   Title Patient will report at least 25% improvement in overall symptoms and/or function to demonstrate improved functional mobility    Time 4    Period Weeks    Status On-going    Target Date 03/31/21      PT SHORT TERM GOAL #3   Title Patient will be able to demosntrate at least 3+/5 MMT in bilateral hips    Time 4    Period Weeks    Status On-going    Target Date 03/31/21      PT SHORT TERM GOAL #4   Title Patient will demonstrate lumbar ROM WFL with symptoms less than 1/10 for improved ability to work.    Time 4    Period Weeks    Status New    Target Date 03/31/21               PT Long Term Goals - 03/09/21 1446       PT LONG TERM GOAL #1   Title Patient will report at least 50% improvement in overall symptoms and/or function to demonstrate improved functional mobility    Time 8    Period Weeks    Status On-going      PT LONG TERM GOAL #2   Title Patient will be able to demonstrate at least 110 degrees in knee flexion bilaterally    Time 8    Period Weeks    Status On-going      PT LONG TERM GOAL #3   Title Patient will report  adherance to HEP at least 5x/week    Time 8    Period Weeks    Status On-going                   Plan - 03/23/21 1042     Clinical Impression Statement Continued with LE and core strengthening which is tolerated well. She requires intermittent cueing for exercise mechanics with good carry over. Patient tolerating increased resistance with previously completed exercises. Patient tends to hyperextend knees with weightbearing and seated exercises which improves with cueing for knee to neutral. Patient will continue to benefit from skilled physical therapy in order to reduce impairment and improve function.    Personal Factors and Comorbidities Comorbidity 1;Comorbidity 2;Comorbidity 3+;Fitness    Comorbidities chronic B knee pain, R hip pain, back pain, obesity    Examination-Activity Limitations Squat;Locomotion Level;Bend;Stand;Stairs    Examination-Participation Restrictions Shop;Community Activity;Cleaning;Occupation    Stability/Clinical Decision Making Stable/Uncomplicated    Rehab Potential Fair    PT Frequency 2x / week    PT Duration 8 weeks    PT Treatment/Interventions ADLs/Self Care Home Management;Therapeutic exercise;Therapeutic activities;Stair training;Gait training;Neuromuscular re-education;Patient/family education;Manual techniques    PT Next Visit Plan lumbar mobility and core strenghtenign, hip and LE strengthening - table exercises to start as patient hyperextends knees in WB- knee and hip ROM    PT Home Exercise Plan clamshells 11/21 press up, lumbar roll; 11/23 hamstring iso on ball, deadbug iso with ball, LTR, SKC, bridge, 90/90 relief position 12/5 bridge with band, clam with band, long arc quad    Consulted and Agree with Plan of Care Patient             Patient will benefit from skilled therapeutic intervention in order to improve the following deficits and impairments:  Decreased endurance, Decreased mobility, Difficulty walking, Decreased range of  motion, Decreased strength, Pain, Decreased activity tolerance,  Abnormal gait  Visit Diagnosis: Chronic pain of both knees  Muscle weakness (generalized)  Difficulty in walking, not elsewhere classified  Low back pain, unspecified back pain laterality, unspecified chronicity, unspecified whether sciatica present     Problem List Patient Active Problem List   Diagnosis Date Noted   Morbid obesity (HCC) 08/25/2020   Chronic hypertension 11/08/2019   History of cesarean delivery 08/01/2019   BMI 50.0-59.9, adult (HCC) 08/01/2019   IUFD at less than 20 weeks of gestation 07/31/2019   Marijuana use 06/21/2019   Smoker 06/20/2019   Abnormal Pap smear of cervix 06/20/2019   Depression with anxiety 06/20/2019    11:26 AM, 03/23/21 Wyman Songster PT, DPT Physical Therapist at [facility]   [place] [facility] [address] [place], [place], [address] Phone: [phone]   Fax:  [phone]  Name: RHODIA ACRES MRN: [id number] Date of Birth: 1986-02-28

## 2021-03-23 NOTE — Patient Instructions (Signed)
Access Code: [email or web address] URL: [email or web address] Date: 03/23/2021 Prepared by: [facility]  [employer] with Hip Abduction and Resistance - 1 x daily - 7 x weekly - 3 sets - 10 reps Clamshell with Resistance - 1 x daily - 7 x weekly - 3 sets - 10 reps Seated Long Arc Quad - 1 x daily - 7 x weekly - 2 sets - 10 reps - 5-10 second hold

## 2021-03-25 ENCOUNTER — Encounter (HOSPITAL_COMMUNITY): Payer: Self-pay

## 2021-03-25 ENCOUNTER — Other Ambulatory Visit: Payer: Self-pay

## 2021-03-25 ENCOUNTER — Ambulatory Visit (HOSPITAL_COMMUNITY): Payer: Medicaid Other

## 2021-03-25 DIAGNOSIS — M545 Low back pain, unspecified: Secondary | ICD-10-CM

## 2021-03-25 DIAGNOSIS — R262 Difficulty in walking, not elsewhere classified: Secondary | ICD-10-CM

## 2021-03-25 DIAGNOSIS — M6281 Muscle weakness (generalized): Secondary | ICD-10-CM

## 2021-03-25 DIAGNOSIS — M25561 Pain in right knee: Secondary | ICD-10-CM | POA: Diagnosis not present

## 2021-03-25 DIAGNOSIS — G8929 Other chronic pain: Secondary | ICD-10-CM | POA: Diagnosis not present

## 2021-03-25 DIAGNOSIS — M25562 Pain in left knee: Secondary | ICD-10-CM | POA: Diagnosis not present

## 2021-03-25 NOTE — Therapy (Signed)
[facility] [address] [place], [place], [address] Phone: [phone]   Fax:  [phone]  Physical Therapy Treatment  Patient Details  Name: [name] MRN: [id number] Date of Birth: [birth year] Referring Provider (PT): Darreld Mclean MD   Encounter Date: 03/25/2021   PT End of Session - 03/25/21 1043     Visit Number 5    Number of Visits 16    Date for PT Re-Evaluation 04/28/21    Authorization Type medicaid health blue    Authorization Time Period 8 visits approved from 11/15-->04/28/2021    PT Start Time 1002    PT Stop Time 1042    PT Time Calculation (min) 40 min    Activity Tolerance Patient tolerated treatment well    Behavior During Therapy [facility] for tasks assessed/performed             Past Medical History:  Diagnosis Date   Arthritis    Hypertension    Kidney infection    UTI (urinary tract infection)    Vaginal Pap smear, abnormal     Past Surgical History:  Procedure Laterality Date   BLADDER SURGERY     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   Subjective Assessment - 03/25/21 1016     Subjective Pt stated she is feeling good today, has been compliant with HEP.    Currently in Pain? No/denies                [facility] PT Assessment - 03/25/21 0001       Assessment   Medical Diagnosis B knee pain/Lumbar pain    Referring Provider (PT) Darreld Mclean MD    Next MD Visit 2023    Prior Therapy no                           [facility] Adult PT Treatment/Exercise - 03/25/21 0001       Knee/Hip Exercises: Stretches   Other Knee/Hip Stretches Lower trunk rotations 5x 10"      Knee/Hip Exercises: Standing   Heel Raises 10 reps    Heel Raises Limitations cueing for knee alignment, reduce valgus and hyperextension prior raise    Terminal Knee Extension Both    Terminal Knee Extension Limitations 20#  working on neutral no hyperextension 2 min each    Other Standing Knee Exercises chair pose 10x 5"      Knee/Hip Exercises: Supine   Bridges 15 reps    Bridges Limitations GTB around knees    Other Supine Knee/Hip Exercises hamstring iso on ball 2x 10 5 second holds      Knee/Hip Exercises: Sidelying   Hip ABduction 15 reps      Knee/Hip Exercises: Prone   Hip Extension 15 reps    Hip Extension Limitations pillows under hip                       PT Short Term Goals - 03/09/21 1445       PT SHORT TERM GOAL #1   Title Patient will be independent in self management strategies to improve quality of life and functional outcomes.    Time 4    Period Weeks    Status On-going    Target Date 03/31/21      PT SHORT  TERM GOAL #2   Title Patient will report at least 25% improvement in overall symptoms and/or function to demonstrate improved functional mobility    Time 4    Period Weeks    Status On-going    Target Date 03/31/21      PT SHORT TERM GOAL #3   Title Patient will be able to demosntrate at least 3+/5 MMT in bilateral hips    Time 4    Period Weeks    Status On-going    Target Date 03/31/21      PT SHORT TERM GOAL #4   Title Patient will demonstrate lumbar ROM WFL with symptoms less than 1/10 for improved ability to work.    Time 4    Period Weeks    Status New    Target Date 03/31/21               PT Long Term Goals - 03/09/21 1446       PT LONG TERM GOAL #1   Title Patient will report at least 50% improvement in overall symptoms and/or function to demonstrate improved functional mobility    Time 8    Period Weeks    Status On-going      PT LONG TERM GOAL #2   Title Patient will be able to demonstrate at least 110 degrees in knee flexion bilaterally    Time 8    Period Weeks    Status On-going      PT LONG TERM GOAL #3   Title Patient will report adherance to HEP at least 5x/week    Time 8    Period Weeks    Status On-going                    Plan - 03/25/21 1019     Clinical Impression Statement Educated on importance of reducing hyperextension for musculature strengthening and reducing joint pressure for pain control.  Added squats for gluteal strengthening with mirror feedback to reduce valgus of knee.  Pt tolerated well to session with good follow through following initial cueing for form and mechanics, did required cueing to knee alignment through session.    Personal Factors and Comorbidities Comorbidity 1;Comorbidity 2;Comorbidity 3+;Fitness    Comorbidities chronic B knee pain, R hip pain, back pain, obesity    Examination-Activity Limitations Squat;Locomotion Level;Bend;Stand;Stairs    Examination-Participation Restrictions Shop;Community Activity;Cleaning;Occupation    Stability/Clinical Decision Making Stable/Uncomplicated    Clinical Decision Making Low    Rehab Potential Fair    PT Frequency 2x / week    PT Duration 8 weeks    PT Treatment/Interventions ADLs/Self Care Home Management;Therapeutic exercise;Therapeutic activities;Stair training;Gait training;Neuromuscular re-education;Patient/family education;Manual techniques    PT Next Visit Plan lumbar mobility and core strenghtenign, hip and LE strengthening - table exercises to start as patient hyperextends knees in WB- knee and hip ROM    PT Home Exercise Plan clamshells 11/21 press up, lumbar roll; 11/23 hamstring iso on ball, deadbug iso with ball, LTR, SKC, bridge, 90/90 relief position 12/5 bridge with band, clam with band, long arc quad    Consulted and Agree with Plan of Care Patient             Patient will benefit from skilled therapeutic intervention in order to improve the following deficits and impairments:  Decreased endurance, Decreased mobility, Difficulty walking, Decreased range of motion, Decreased strength, Pain, Decreased activity tolerance, Abnormal gait  Visit Diagnosis: Chronic pain of both knees  Muscle weakness  (  generalized)  Difficulty in walking, not elsewhere classified  Low back pain, unspecified back pain laterality, unspecified chronicity, unspecified whether sciatica present     Problem List Patient Active Problem List   Diagnosis Date Noted   Morbid obesity (HCC) 08/25/2020   Chronic hypertension 11/08/2019   History of cesarean delivery 08/01/2019   BMI 50.0-59.9, adult (HCC) 08/01/2019   IUFD at less than 20 weeks of gestation 07/31/2019   Marijuana use 06/21/2019   Smoker 06/20/2019   Abnormal Pap smear of cervix 06/20/2019   Depression with anxiety 06/20/2019   Becky Sax, LPTA/CLT; CBIS [phone]  Juel Burrow, PTA 03/25/2021, 10:44 AM  [facility] [address] [place], [place], [address] Phone: [phone]   Fax:  [phone]  Name: [name] MRN: [id number] Date of Birth: 1985/06/08

## 2021-04-01 ENCOUNTER — Ambulatory Visit (HOSPITAL_COMMUNITY): Payer: Medicaid Other

## 2021-04-02 ENCOUNTER — Ambulatory Visit: Payer: Medicaid Other | Admitting: Orthopaedic Surgery

## 2021-04-03 ENCOUNTER — Other Ambulatory Visit: Payer: Self-pay

## 2021-04-03 ENCOUNTER — Ambulatory Visit (HOSPITAL_COMMUNITY): Payer: Medicaid Other

## 2021-04-03 DIAGNOSIS — M6281 Muscle weakness (generalized): Secondary | ICD-10-CM

## 2021-04-03 DIAGNOSIS — M545 Low back pain, unspecified: Secondary | ICD-10-CM

## 2021-04-03 DIAGNOSIS — G8929 Other chronic pain: Secondary | ICD-10-CM

## 2021-04-03 DIAGNOSIS — R262 Difficulty in walking, not elsewhere classified: Secondary | ICD-10-CM

## 2021-04-03 DIAGNOSIS — M25561 Pain in right knee: Secondary | ICD-10-CM

## 2021-04-03 DIAGNOSIS — M25562 Pain in left knee: Secondary | ICD-10-CM | POA: Diagnosis not present

## 2021-04-03 NOTE — Therapy (Signed)
[facility] [address] [place], [place], [address] Phone: [phone]   Fax:  [phone]  Physical Therapy Treatment  Patient Details  Name: [name] MRN: [id number] Date of Birth: May 14, 1985 Referring Provider (PT): Darreld Mclean MD   Encounter Date: 04/03/2021   PT End of Session - 04/03/21 1121     Visit Number 6    Number of Visits 16    Date for PT Re-Evaluation 04/28/21    Authorization Type medicaid health blue    Authorization Time Period 8 visits approved from 11/15-->04/28/2021    PT Start Time 1100    PT Stop Time 1145    PT Time Calculation (min) 45 min    Activity Tolerance Patient tolerated treatment well    Behavior During Therapy [facility] for tasks assessed/performed             Past Medical History:  Diagnosis Date   Arthritis    Hypertension    Kidney infection    UTI (urinary tract infection)    Vaginal Pap smear, abnormal     Past Surgical History:  Procedure Laterality Date   BLADDER SURGERY     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   Subjective Assessment - 04/03/21 1056     Subjective Starting to feel strong and better ability to control knee hyperextension with conscious effort    Currently in Pain? Yes    Pain Score 1     Pain Orientation Lower    Pain Descriptors / Indicators Sore    Pain Type Chronic pain                [facility] PT Assessment - 04/03/21 0001       Assessment   Medical Diagnosis B knee pain/Lumbar pain                           [facility] Adult PT Treatment/Exercise - 04/03/21 0001       Knee/Hip Exercises: Standing   Terminal Knee Extension Both    Terminal Knee Extension Limitations 20# working on neutral no hyperextension 2 min each    Other Standing Knee Exercises lateral steps with blue t-loop x 2 min      Knee/Hip Exercises: Seated   Long Arc Quad  Strengthening;Both;3 sets;10 reps    Long Arc Quad Limitations blue t-loop resistance    Marching Strengthening;Both;3 sets;10 reps    Marching Limitations blue t-loop                     PT Education - 04/03/21 1153     Education Details discussion/education regarding benefits and drawbacks of knee bracing for valgus deformity. Education regarding supportive footwear, e.g. wide toe box, wide last, medial posting. HEP additions/review    Person(s) Educated Patient    Methods Explanation;Demonstration;Handout    Comprehension Verbalized understanding              PT Short Term Goals - 03/09/21 1445       PT SHORT TERM GOAL #1   Title Patient will be independent in self management strategies to improve quality of life and functional outcomes.    Time 4    Period Weeks    Status On-going    Target Date 03/31/21      PT  SHORT TERM GOAL #2   Title Patient will report at least 25% improvement in overall symptoms and/or function to demonstrate improved functional mobility    Time 4    Period Weeks    Status On-going    Target Date 03/31/21      PT SHORT TERM GOAL #3   Title Patient will be able to demosntrate at least 3+/5 MMT in bilateral hips    Time 4    Period Weeks    Status On-going    Target Date 03/31/21      PT SHORT TERM GOAL #4   Title Patient will demonstrate lumbar ROM WFL with symptoms less than 1/10 for improved ability to work.    Time 4    Period Weeks    Status New    Target Date 03/31/21               PT Long Term Goals - 03/09/21 1446       PT LONG TERM GOAL #1   Title Patient will report at least 50% improvement in overall symptoms and/or function to demonstrate improved functional mobility    Time 8    Period Weeks    Status On-going      PT LONG TERM GOAL #2   Title Patient will be able to demonstrate at least 110 degrees in knee flexion bilaterally    Time 8    Period Weeks    Status On-going      PT LONG TERM GOAL  #3   Title Patient will report adherance to HEP at least 5x/week    Time 8    Period Weeks    Status On-going                   Plan - 04/03/21 1154     Clinical Impression Statement TOlerating tx session well and demonstrates good compliance with HEP and biomechanic principles with good teachback demonstration appreciated.  Continuing to progress with knee valgus control with emphasis on promoting hip external rotation and improving knee position for neutral position during dynamic movements.  Continued sessions indicated to improve hip/knee strength/motor control to reduce pain and improve LE kinematics    Personal Factors and Comorbidities Comorbidity 1;Comorbidity 2;Comorbidity 3+;Fitness    Comorbidities chronic B knee pain, R hip pain, back pain, obesity    Examination-Activity Limitations Squat;Locomotion Level;Bend;Stand;Stairs    Examination-Participation Restrictions Shop;Community Activity;Cleaning;Occupation    Stability/Clinical Decision Making Stable/Uncomplicated    Rehab Potential Fair    PT Frequency 2x / week    PT Duration 8 weeks    PT Treatment/Interventions ADLs/Self Care Home Management;Therapeutic exercise;Therapeutic activities;Stair training;Gait training;Neuromuscular re-education;Patient/family education;Manual techniques    PT Next Visit Plan lumbar mobility and core strenghtenign, hip and LE strengthening - table exercises to start as patient hyperextends knees in WB- knee and hip ROM    PT Home Exercise Plan clamshells 11/21 press up, lumbar roll; 11/23 hamstring iso on ball, deadbug iso with ball, LTR, SKC, bridge, 90/90 relief position 12/5 bridge with band, clam with band, long arc quad    Consulted and Agree with Plan of Care Patient             Patient will benefit from skilled therapeutic intervention in order to improve the following deficits and impairments:  Decreased endurance, Decreased mobility, Difficulty walking, Decreased range of  motion, Decreased strength, Pain, Decreased activity tolerance, Abnormal gait  Visit Diagnosis: Chronic pain of both knees  Muscle weakness (generalized)  Difficulty in walking, not elsewhere classified  Low back pain, unspecified back pain laterality, unspecified chronicity, unspecified whether sciatica present     Problem List Patient Active Problem List   Diagnosis Date Noted   Morbid obesity (HCC) 08/25/2020   Chronic hypertension 11/08/2019   History of cesarean delivery 08/01/2019   BMI 50.0-59.9, adult (HCC) 08/01/2019   IUFD at less than 20 weeks of gestation 07/31/2019   Marijuana use 06/21/2019   Smoker 06/20/2019   Abnormal Pap smear of cervix 06/20/2019   Depression with anxiety 06/20/2019    Dion Body, PT 04/03/2021, 11:56 AM  [place] [facility] [address] [place], [place], [address] Phone: [phone]   Fax:  [phone]  Name: [name] MRN: [id number] Date of Birth: May 15, 1985

## 2021-04-08 ENCOUNTER — Encounter (HOSPITAL_COMMUNITY): Payer: Self-pay | Admitting: Physical Therapy

## 2021-04-08 ENCOUNTER — Other Ambulatory Visit: Payer: Self-pay

## 2021-04-08 ENCOUNTER — Ambulatory Visit (HOSPITAL_COMMUNITY): Payer: Medicaid Other | Admitting: Physical Therapy

## 2021-04-08 DIAGNOSIS — M545 Low back pain, unspecified: Secondary | ICD-10-CM | POA: Diagnosis not present

## 2021-04-08 DIAGNOSIS — M6281 Muscle weakness (generalized): Secondary | ICD-10-CM

## 2021-04-08 DIAGNOSIS — R262 Difficulty in walking, not elsewhere classified: Secondary | ICD-10-CM

## 2021-04-08 DIAGNOSIS — M25562 Pain in left knee: Secondary | ICD-10-CM

## 2021-04-08 DIAGNOSIS — M25561 Pain in right knee: Secondary | ICD-10-CM | POA: Diagnosis not present

## 2021-04-08 DIAGNOSIS — G8929 Other chronic pain: Secondary | ICD-10-CM | POA: Diagnosis not present

## 2021-04-08 NOTE — Therapy (Signed)
[facility] [address] [place], [place], [address] Phone: [phone]   Fax:  [phone]  Physical Therapy Treatment  Patient Details  Name: [name] MRN: [id number] Date of Birth: Apr 19, 1986 Referring Provider (PT): Darreld Mclean MD   Encounter Date: 04/08/2021   PT End of Session - 04/08/21 0916     Visit Number 7    Number of Visits 16    Date for PT Re-Evaluation 04/28/21    Authorization Type medicaid health blue    Authorization Time Period 8 visits approved from 11/15-->04/28/2021    Authorization - Visit Number 6    Authorization - Number of Visits 8    PT Start Time 0917    PT Stop Time 0956    PT Time Calculation (min) 39 min    Activity Tolerance Patient tolerated treatment well    Behavior During Therapy [facility] for tasks assessed/performed             Past Medical History:  Diagnosis Date   Arthritis    Hypertension    Kidney infection    UTI (urinary tract infection)    Vaginal Pap smear, abnormal     Past Surgical History:  Procedure Laterality Date   BLADDER SURGERY     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   Subjective Assessment - 04/08/21 0917     Subjective Patient states knees have been doing good. Didnt get monthly shots. Her HEP and workouts are going well.    Currently in Pain? No/denies                               [facility] Adult PT Treatment/Exercise - 04/08/21 0001       Knee/Hip Exercises: Standing   Heel Raises 15 reps    Heel Raises Limitations cueing for knee alignment, reduce valgus and hyperextension prior raise    Hip Flexion Both;2 sets;10 reps    Hip Flexion Limitations alternating march    Hip Abduction Both;3 sets;10 reps    Hip Extension Both;3 sets;10 reps    Forward Step Up Both;2 sets;10 reps;Hand Hold: 2;Step Height: 4"    Other Standing Knee Exercises chair  pose 2x  10x 5" blue band at knees    Other Standing Knee Exercises lateral stepping with blue t-loop 8 RT parallel bars      Knee/Hip Exercises: Seated   Other Seated Knee/Hip Exercises hamstring isometrics 10 x 10 second holds bilateral into green ball                     PT Education - 04/08/21 0917     Education Details HEP    Person(s) Educated Patient    Methods Explanation;Demonstration    Comprehension Verbalized understanding;Returned demonstration              PT Short Term Goals - 03/09/21 1445       PT SHORT TERM GOAL #1   Title Patient will be independent in self management strategies to improve quality of life and functional outcomes.    Time 4    Period Weeks    Status On-going    Target Date 03/31/21      PT SHORT TERM GOAL #2   Title Patient will report at least 25% improvement  in overall symptoms and/or function to demonstrate improved functional mobility    Time 4    Period Weeks    Status On-going    Target Date 03/31/21      PT SHORT TERM GOAL #3   Title Patient will be able to demosntrate at least 3+/5 MMT in bilateral hips    Time 4    Period Weeks    Status On-going    Target Date 03/31/21      PT SHORT TERM GOAL #4   Title Patient will demonstrate lumbar ROM WFL with symptoms less than 1/10 for improved ability to work.    Time 4    Period Weeks    Status New    Target Date 03/31/21               PT Long Term Goals - 03/09/21 1446       PT LONG TERM GOAL #1   Title Patient will report at least 50% improvement in overall symptoms and/or function to demonstrate improved functional mobility    Time 8    Period Weeks    Status On-going      PT LONG TERM GOAL #2   Title Patient will be able to demonstrate at least 110 degrees in knee flexion bilaterally    Time 8    Period Weeks    Status On-going      PT LONG TERM GOAL #3   Title Patient will report adherance to HEP at least 5x/week    Time 8    Period Weeks     Status On-going                   Plan - 04/08/21 0917     Clinical Impression Statement Patient given intermittent cueing throughout session for avoiding hyperextension with fair carry over. Continued hip and quad strengthening today with emphasis on standing exercises. Began step training which is tolerated well. Patient given cueing for glute activation with chair pose and added blue band for abductor activation. Educated on probable muscle soreness following increased reps of strength training and new exercises. Patient will continue to benefit from skilled physical therapy in order to reduce impairment and improve function.    Personal Factors and Comorbidities Comorbidity 1;Comorbidity 2;Comorbidity 3+;Fitness    Comorbidities chronic B knee pain, R hip pain, back pain, obesity    Examination-Activity Limitations Squat;Locomotion Level;Bend;Stand;Stairs    Examination-Participation Restrictions Shop;Community Activity;Cleaning;Occupation    Stability/Clinical Decision Making Stable/Uncomplicated    Rehab Potential Fair    PT Frequency 2x / week    PT Duration 8 weeks    PT Treatment/Interventions ADLs/Self Care Home Management;Therapeutic exercise;Therapeutic activities;Stair training;Gait training;Neuromuscular re-education;Patient/family education;Manual techniques    PT Next Visit Plan lumbar mobility and core strenghtenign, hip and LE strengthening - table exercises to start as patient hyperextends knees in WB- knee and hip ROM    PT Home Exercise Plan clamshells 11/21 press up, lumbar roll; 11/23 hamstring iso on ball, deadbug iso with ball, LTR, SKC, bridge, 90/90 relief position 12/5 bridge with band, clam with band, long arc quad    Consulted and Agree with Plan of Care Patient             Patient will benefit from skilled therapeutic intervention in order to improve the following deficits and impairments:  Decreased endurance, Decreased mobility, Difficulty  walking, Decreased range of motion, Decreased strength, Pain, Decreased activity tolerance, Abnormal gait  Visit Diagnosis: Chronic pain of both  knees  Muscle weakness (generalized)  Difficulty in walking, not elsewhere classified  Low back pain, unspecified back pain laterality, unspecified chronicity, unspecified whether sciatica present     Problem List Patient Active Problem List   Diagnosis Date Noted   Morbid obesity (HCC) 08/25/2020   Chronic hypertension 11/08/2019   History of cesarean delivery 08/01/2019   BMI 50.0-59.9, adult (HCC) 08/01/2019   IUFD at less than 20 weeks of gestation 07/31/2019   Marijuana use 06/21/2019   Smoker 06/20/2019   Abnormal Pap smear of cervix 06/20/2019   Depression with anxiety 06/20/2019    9:55 AM, 04/08/21 Wyman Songster PT, DPT Physical Therapist at [facility]   [facility] [address] [place], [place], [address] Phone: [phone]   Fax:  [phone]  Name: [name] MRN: [id number] Date of Birth: 05/15/1985

## 2021-04-10 ENCOUNTER — Ambulatory Visit (HOSPITAL_COMMUNITY): Payer: Medicaid Other

## 2021-04-15 ENCOUNTER — Ambulatory Visit (HOSPITAL_COMMUNITY): Payer: Medicaid Other | Admitting: Physical Therapy

## 2021-04-17 ENCOUNTER — Ambulatory Visit (HOSPITAL_COMMUNITY): Payer: Medicaid Other

## 2021-04-21 ENCOUNTER — Encounter: Payer: Self-pay | Admitting: Orthopaedic Surgery

## 2021-04-21 ENCOUNTER — Encounter (HOSPITAL_COMMUNITY): Payer: Medicaid Other | Admitting: Physical Therapy

## 2021-04-21 ENCOUNTER — Ambulatory Visit (INDEPENDENT_AMBULATORY_CARE_PROVIDER_SITE_OTHER): Payer: Medicaid Other | Admitting: Orthopaedic Surgery

## 2021-04-21 ENCOUNTER — Other Ambulatory Visit: Payer: Self-pay

## 2021-04-21 DIAGNOSIS — M25561 Pain in right knee: Secondary | ICD-10-CM

## 2021-04-21 DIAGNOSIS — Z6841 Body Mass Index (BMI) 40.0 and over, adult: Secondary | ICD-10-CM

## 2021-04-21 DIAGNOSIS — G8929 Other chronic pain: Secondary | ICD-10-CM

## 2021-04-21 DIAGNOSIS — M25562 Pain in left knee: Secondary | ICD-10-CM | POA: Diagnosis not present

## 2021-04-21 MED ORDER — HYDROCODONE-ACETAMINOPHEN 5-325 MG PO TABS: ORAL_TABLET | ORAL | 0 refills | Status: DC

## 2021-04-21 MED ORDER — CYCLOBENZAPRINE HCL 10 MG PO TABS: 10.0000 mg | ORAL_TABLET | Freq: Every day | ORAL | 0 refills | Status: DC

## 2021-04-21 NOTE — Progress Notes (Signed)
PROCEDURE NOTE:  The patient requests injections of the left knee , verbal consent was obtained.  The left knee was prepped appropriately after time out was performed.   Sterile technique was observed and injection of 1 cc of DepoMedrol 40 mg with several cc's of plain xylocaine. Anesthesia was provided by ethyl chloride and a 20-gauge needle was used to inject the knee area. The injection was tolerated well.  A band aid dressing was applied.  The patient was advised to apply ice later today and tomorrow to the injection sight as needed.   PROCEDURE NOTE:  The patient requests injections of the right knee , verbal consent was obtained.  The right knee was prepped appropriately after time out was performed.   Sterile technique was observed and injection of 1 cc of DepoMedrol 40mg  with several cc's of plain xylocaine. Anesthesia was provided by ethyl chloride and a 20-gauge needle was used to inject the knee area. The injection was tolerated well.  A band aid dressing was applied.  The patient was advised to apply ice later today and tomorrow to the injection sight as needed.   Encounter Diagnoses  Name Primary?   Chronic pain of both knees Yes   Morbid obesity (HCC)    Body mass index 45.0-49.9, adult (HCC)    Return in one month.  I have reviewed the 09-12-1983 Controlled Substance Reporting System web site prior to prescribing narcotic medicine for this patient.  I refilled her Flexeril.  Call if any problem.  Precautions discussed.  Electronically Signed [place], MD 1/3/20238:55 AM

## 2021-04-22 ENCOUNTER — Encounter (HOSPITAL_COMMUNITY): Payer: Self-pay

## 2021-04-22 ENCOUNTER — Ambulatory Visit (HOSPITAL_COMMUNITY): Payer: Medicaid Other | Attending: Orthopaedic Surgery

## 2021-04-22 DIAGNOSIS — M25561 Pain in right knee: Secondary | ICD-10-CM | POA: Diagnosis not present

## 2021-04-22 DIAGNOSIS — R262 Difficulty in walking, not elsewhere classified: Secondary | ICD-10-CM

## 2021-04-22 DIAGNOSIS — M6281 Muscle weakness (generalized): Secondary | ICD-10-CM

## 2021-04-22 DIAGNOSIS — M545 Low back pain, unspecified: Secondary | ICD-10-CM | POA: Diagnosis not present

## 2021-04-22 DIAGNOSIS — G8929 Other chronic pain: Secondary | ICD-10-CM | POA: Diagnosis not present

## 2021-04-22 DIAGNOSIS — M25562 Pain in left knee: Secondary | ICD-10-CM | POA: Insufficient documentation

## 2021-04-22 NOTE — Therapy (Signed)
[place], [place], [address] Phone: [phone]   Fax:  [phone]  Physical Therapy Treatment  Patient Details  Name: GLENDALY ROLLIE MRN: [id number] Date of Birth: 1985/08/29 Referring Provider (PT): Sanjuana Kava MD   Encounter Date: 04/22/2021   PT End of Session - 04/22/21 1042     Visit Number 8    Number of Visits 16    Date for PT Re-Evaluation 04/28/21    Authorization Type medicaid health blue    Authorization Time Period 8 visits approved from 11/15-->04/28/2021    Authorization - Visit Number 7    Authorization - Number of Visits 8    PT Start Time [id number]    PT Stop Time [id number]    PT Time Calculation (min) 43 min    Activity Tolerance Patient tolerated treatment well;No increased pain    Behavior During Therapy WFL for tasks assessed/performed             Past Medical History:  Diagnosis Date   Arthritis    Hypertension    Kidney infection    UTI (urinary tract infection)    Vaginal Pap smear, abnormal     Past Surgical History:  Procedure Laterality Date   BLADDER SURGERY     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   Subjective Assessment - 04/22/21 1048     Subjective Pt denies pain, reports ability to stretch shots every 2 months since starting PT. Pt reports HEP is going well at home and got shots yesterday without any reactions.    Currently in Pain? No/denies                  [facility] Adult PT Treatment/Exercise - 04/22/21 0001       Knee/Hip Exercises: Stretches   Sports administrator Both;5 reps;10 seconds    Quad Stretch Limitations butt kick in parallel bars    Piriformis Stretch Both;30 seconds    Piriformis Stretch Limitations figure 4, seated      Knee/Hip Exercises: Standing   Heel Raises 15 reps    Hip Abduction Both;10 reps    Abduction Limitations in front of mirror for visual  feedback for form/LE alignment    Hip Extension Both;10 reps    Extension Limitations in front of mirror for visual feedback for form/LE alignment    Functional Squat 10 reps    Functional Squat Limitations in front of mirror for visual feedback for form/LE alignment    Wall Squat 3 sets;5 reps    Wall Squat Limitations decreased range      Knee/Hip Exercises: Seated   Sit to Sand 2 sets;5 reps   blue theraband around thighs     Knee/Hip Exercises: Supine   Bridges 10 reps;2 sets    Bridges Limitations gait belt around thighs, 5 sec isometric hold    Straight Leg Raises Strengthening;Both;2 sets;10 reps   eccentric lowering     Knee/Hip Exercises: Sidelying   Clams 10 reps, 2 sets, blue theraband around thighs                     PT Education - 04/22/21 1041     Education Details Exercise technique, continue HEP and added wall sit    Person(s) Educated Patient    Methods Explanation;Demonstration    Comprehension Verbalized understanding;Returned  demonstration              PT Short Term Goals - 03/09/21 1445       PT SHORT TERM GOAL #1   Title Patient will be independent in self management strategies to improve quality of life and functional outcomes.    Time 4    Period Weeks    Status On-going    Target Date 03/31/21      PT SHORT TERM GOAL #2   Title Patient will report at least 25% improvement in overall symptoms and/or function to demonstrate improved functional mobility    Time 4    Period Weeks    Status On-going    Target Date 03/31/21      PT SHORT TERM GOAL #3   Title Patient will be able to demosntrate at least 3+/5 MMT in bilateral hips    Time 4    Period Weeks    Status On-going    Target Date 03/31/21      PT SHORT TERM GOAL #4   Title Patient will demonstrate lumbar ROM WFL with symptoms less than 1/10 for improved ability to work.    Time 4    Period Weeks    Status New    Target Date 03/31/21               PT Long  Term Goals - 03/09/21 1446       PT LONG TERM GOAL #1   Title Patient will report at least 50% improvement in overall symptoms and/or function to demonstrate improved functional mobility    Time 8    Period Weeks    Status On-going      PT LONG TERM GOAL #2   Title Patient will be able to demonstrate at least 110 degrees in knee flexion bilaterally    Time 8    Period Weeks    Status On-going      PT LONG TERM GOAL #3   Title Patient will report adherance to HEP at least 5x/week    Time 8    Period Weeks    Status On-going                   Plan - 04/22/21 1134     Clinical Impression Statement Pt tolerates mat exercises without pain, good muscle activation and motor control noted. Continued standing exercises for hip and knee strengthening, provided mirror to improve visual feedback with LE alignment to decreased knee hyperextension and genu valgus. Pt demonstrates closed kinetic chain weakness, pt visually aware and activates muscles to reduce collapsing into hips and knees in standing. Added wall sits without complaints and good performance; added to HEP. Will continue to progress and build upon HEP as able.    Personal Factors and Comorbidities Comorbidity 1;Comorbidity 2;Comorbidity 3+;Fitness    Comorbidities chronic B knee pain, R hip pain, back pain, obesity    Examination-Activity Limitations Squat;Locomotion Level;Bend;Stand;Stairs    Examination-Participation Restrictions Shop;Community Activity;Cleaning;Occupation    Stability/Clinical Decision Making Stable/Uncomplicated    Rehab Potential Fair    PT Frequency 2x / week    PT Duration 8 weeks    PT Treatment/Interventions ADLs/Self Care Home Management;Therapeutic exercise;Therapeutic activities;Stair training;Gait training;Neuromuscular re-education;Patient/family education;Manual techniques    PT Next Visit Plan lumbar mobility and core strenghtening, hip and LE strengthening - table exercises to start as  patient hyperextends knees in WB- knee and hip ROM    PT Home Exercise Plan clamshells 11/21 press  up, lumbar roll; 11/23 hamstring iso on ball, deadbug iso with ball, LTR, SKC, bridge, 90/90 relief position 12/5 bridge with band, clam with band, long arc quad; 1/4 wall sit    Consulted and Agree with Plan of Care Patient             Patient will benefit from skilled therapeutic intervention in order to improve the following deficits and impairments:  Decreased endurance, Decreased mobility, Difficulty walking, Decreased range of motion, Decreased strength, Pain, Decreased activity tolerance, Abnormal gait  Visit Diagnosis: Chronic pain of both knees  Muscle weakness (generalized)  Difficulty in walking, not elsewhere classified     Problem List Patient Active Problem List   Diagnosis Date Noted   Morbid obesity ([place]) 08/25/2020   Chronic hypertension 11/08/2019   History of cesarean delivery 08/01/2019   BMI 50.0-59.9, adult ([place]) 08/01/2019   IUFD at less than 20 weeks of gestation 07/31/2019   Marijuana use 06/21/2019   Smoker 06/20/2019   Abnormal Pap smear of cervix 06/20/2019   Depression with anxiety 06/20/2019     Talbot Grumbling PT, DPT 04/22/21, 11:46 AM    [place] [address] [place], [place], [address] Phone: [phone]   Fax:  [phone]  Name: CATHRIN ERMEL MRN: [id number] Date of Birth: 03-03-1986

## 2021-04-23 ENCOUNTER — Ambulatory Visit: Payer: Medicaid Other | Admitting: Orthopaedic Surgery

## 2021-04-24 ENCOUNTER — Other Ambulatory Visit: Payer: Self-pay

## 2021-04-24 ENCOUNTER — Encounter (HOSPITAL_COMMUNITY): Payer: Self-pay | Admitting: Physical Therapy

## 2021-04-24 ENCOUNTER — Ambulatory Visit (HOSPITAL_COMMUNITY): Payer: Medicaid Other | Admitting: Physical Therapy

## 2021-04-24 DIAGNOSIS — R262 Difficulty in walking, not elsewhere classified: Secondary | ICD-10-CM

## 2021-04-24 DIAGNOSIS — M6281 Muscle weakness (generalized): Secondary | ICD-10-CM

## 2021-04-24 DIAGNOSIS — M25562 Pain in left knee: Secondary | ICD-10-CM

## 2021-04-24 DIAGNOSIS — M545 Low back pain, unspecified: Secondary | ICD-10-CM

## 2021-04-24 DIAGNOSIS — G8929 Other chronic pain: Secondary | ICD-10-CM | POA: Diagnosis not present

## 2021-04-24 DIAGNOSIS — M25561 Pain in right knee: Secondary | ICD-10-CM | POA: Diagnosis not present

## 2021-04-24 NOTE — Therapy (Signed)
[place], [place], [address] Phone: [phone]   Fax:  [phone]  Physical Therapy Treatment  Patient Details  Name: [name] MRN: [id number] Date of Birth: [birth year] Referring Provider (PT): Sanjuana Kava MD  PHYSICAL THERAPY DISCHARGE SUMMARY  Visits from Start of Care: 9  Current functional level related to goals / functional outcomes: See below   Remaining deficits: See below   Education / Equipment: HEP   Patient agrees to discharge. Patient goals were partially met. Patient is being discharged due to being pleased with the current functional level.  Encounter Date: 04/24/2021   PT End of Session - 04/24/21 1132     Visit Number 9    Number of Visits 9   Date for PT Re-Evaluation 04/28/21    Authorization Type medicaid health blue    Authorization Time Period 8 visits approved from 11/15-->04/28/2021    Authorization - Visit Number 8    Authorization - Number of Visits 8    Activity Tolerance Patient tolerated treatment well;No increased pain    Behavior During Therapy WFL for tasks assessed/performed             Past Medical History:  Diagnosis Date   Arthritis    Hypertension    Kidney infection    UTI (urinary tract infection)    Vaginal Pap smear, abnormal     Past Surgical History:  Procedure Laterality Date   BLADDER SURGERY     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   Subjective Assessment - 04/24/21 1133     Subjective Pt states that she has been doing her exercises regularly and has no question.    How long can you sit comfortably? able to sit for 45 minutes    How long can you stand comfortably? 30-35 minutes    How long can you walk comfortably? Pt has walked 8000 steps    Currently in Pain? No/denies                [facility] PT Assessment - 04/24/21 0001       Assessment    Medical Diagnosis B knee pain/Lumbar pain    Referring Provider (PT) Sanjuana Kava MD    Prior Therapy no      Prior Function   Level of Independence Independent    Vocation Full time employment    Vocation Requirements at assisted living facilty      Cognition   Overall Cognitive Status Within Functional Limits for tasks assessed      Observation/Other Assessments   Observations severe balgus bilaterally, hyperextends knee with walking    Focus on Therapeutic Outcomes (FOTO)  NA      AROM   Overall AROM Comments c/o pulling/pain with all but flexion, flexion felt good    Right Knee Extension 2   hyperextension   Right Knee Flexion 115   109   Left Knee Extension 0   was 4   Left Knee Flexion 115   100   Lumbar Flexion 0% limited    Lumbar Extension normal was 25% limited    Lumbar - Right Side Bend 20% limited 50% limited    Lumbar - Left Side Bend 20% limited was50% limited    Lumbar - Right Rotation wflwas  50% limited    Lumbar - Left Rotation  10% limited was 50% limited      Strength   Right Hip Flexion 5/5    Right Hip Extension 4+/5   was 3-   Right Hip ABduction 3-/5   was 2+   Left Hip Flexion 5/5    Left Hip Extension 4+/5   was 3-   Left Hip ABduction 5/5   was 3+   Right Knee Flexion 5/5   was 3+/5   Right Knee Extension 5/5   was 3+   Left Knee Flexion 5/5   was 3+   Left Knee Extension 5/5   was4-   Right Ankle Dorsiflexion 5/5    Left Ankle Dorsiflexion 5/5      Palpation   Palpation comment TTP lower lumbar paraspinals                           [facility] Adult PT Treatment/Exercise - 04/24/21 0001       Knee/Hip Exercises: Sidelying   Hip ABduction Right;15 reps                       PT Short Term Goals - 04/24/21 1148       PT SHORT TERM GOAL #1   Title Patient will be independent in self management strategies to improve quality of life and functional outcomes.    Time 4    Period Weeks    Status On-going     Target Date 03/31/21      PT SHORT TERM GOAL #2   Title Patient will report at least 25% improvement in overall symptoms and/or function to demonstrate improved functional mobility    Time 4    Period Weeks    Status Achieved    Target Date 03/31/21      PT SHORT TERM GOAL #3   Title Patient will be able to demosntrate at least 3+/5 MMT in bilateral hips    Time 4    Period Weeks    Status Achieved    Target Date 03/31/21      PT SHORT TERM GOAL #4   Title Patient will demonstrate lumbar ROM WFL with symptoms less than 1/10 for improved ability to work.    Time 4    Period Weeks    Status New    Target Date 03/31/21               PT Long Term Goals - 04/24/21 1149       PT LONG TERM GOAL #1   Title Patient will report at least 50% improvement in overall symptoms and/or function to demonstrate improved functional mobility    Time 8    Period Weeks    Status On-going      PT LONG TERM GOAL #2   Title Patient will be able to demonstrate at least 110 degrees in knee flexion bilaterally    Time 8    Period Weeks    Status On-going      PT LONG TERM GOAL #3   Title Patient will report adherance to HEP at least 5x/week    Time 8    Period Weeks    Status Achieved                   Plan - 04/24/21 1152     Clinical Impression Statement Pt reassessed.  Pt strength and pain has improved. Pt feels that she is ready for discharge  and will continue her exercises on her own.    Personal Factors and Comorbidities Comorbidity 1;Comorbidity 2;Comorbidity 3+;Fitness    Comorbidities chronic B knee pain, R hip pain, back pain, obesity    Examination-Activity Limitations Squat;Locomotion Level;Bend;Stand;Stairs    Examination-Participation Restrictions Shop;Community Activity;Cleaning;Occupation    Stability/Clinical Decision Making Stable/Uncomplicated    Rehab Potential Fair    PT Frequency 2x / week    PT Duration 8 weeks    PT Treatment/Interventions ADLs/Self  Care Home Management;Therapeutic exercise;Therapeutic activities;Stair training;Gait training;Neuromuscular re-education;Patient/family education;Manual techniques    PT Next Visit Plan Discharge.PT    PT Home Exercise Plan clamshells 11/21 press up, lumbar roll; 11/23 hamstring iso on ball, deadbug iso with ball, LTR, SKC, bridge, 90/90 relief position 12/5 bridge with band, clam with band, long arc quad; 1/4 wall sit    Consulted and Agree with Plan of Care Patient             Patient will benefit from skilled therapeutic intervention in order to improve the following deficits and impairments:  Decreased endurance, Decreased mobility, Difficulty walking, Decreased range of motion, Decreased strength, Pain, Decreased activity tolerance, Abnormal gait  Visit Diagnosis: Chronic pain of both knees  Muscle weakness (generalized)  Difficulty in walking, not elsewhere classified  Low back pain, unspecified back pain laterality, unspecified chronicity, unspecified whether sciatica present     Problem List Patient Active Problem List   Diagnosis Date Noted   Morbid obesity ([place]) 08/25/2020   Chronic hypertension 11/08/2019   History of cesarean delivery 08/01/2019   BMI 50.0-59.9, adult ([place]) 08/01/2019   IUFD at less than 20 weeks of gestation 07/31/2019   Marijuana use 06/21/2019   Smoker 06/20/2019   Abnormal Pap smear of cervix 06/20/2019   Depression with anxiety 06/20/2019  Rayetta Humphrey, PT CLT [id number]  04/24/2021, 12:06 PM  [place] [address] [place], [place], [address] Phone: [phone]   Fax:  [phone]  Name: [name] MRN: [id number] Date of Birth: 09-Aug-1985

## 2021-04-27 ENCOUNTER — Encounter (HOSPITAL_COMMUNITY): Payer: Medicaid Other | Admitting: Physical Therapy

## 2021-04-29 ENCOUNTER — Encounter (HOSPITAL_COMMUNITY): Payer: Medicaid Other

## 2021-05-19 ENCOUNTER — Encounter: Payer: Self-pay | Admitting: Orthopaedic Surgery

## 2021-05-19 ENCOUNTER — Other Ambulatory Visit: Payer: Self-pay

## 2021-05-19 ENCOUNTER — Ambulatory Visit (INDEPENDENT_AMBULATORY_CARE_PROVIDER_SITE_OTHER): Payer: Medicaid Other | Admitting: Orthopaedic Surgery

## 2021-05-19 DIAGNOSIS — M25561 Pain in right knee: Secondary | ICD-10-CM

## 2021-05-19 DIAGNOSIS — M25562 Pain in left knee: Secondary | ICD-10-CM | POA: Diagnosis not present

## 2021-05-19 DIAGNOSIS — G8929 Other chronic pain: Secondary | ICD-10-CM

## 2021-05-19 DIAGNOSIS — Z6841 Body Mass Index (BMI) 40.0 and over, adult: Secondary | ICD-10-CM

## 2021-05-19 MED ORDER — HYDROCODONE-ACETAMINOPHEN 5-325 MG PO TABS: ORAL_TABLET | ORAL | 0 refills | Status: DC

## 2021-05-19 MED ORDER — NAPROXEN 500 MG PO TABS: 500.0000 mg | ORAL_TABLET | Freq: Two times a day (BID) | ORAL | 5 refills | Status: DC

## 2021-05-19 NOTE — Progress Notes (Signed)
PROCEDURE NOTE:  The patient requests injections of the left knee , verbal consent was obtained.  The left knee was prepped appropriately after time out was performed.   Sterile technique was observed and injection of 1 cc of DepoMedrol 40 mg with several cc's of plain xylocaine. Anesthesia was provided by ethyl chloride and a 20-gauge needle was used to inject the knee area. The injection was tolerated well.  A band aid dressing was applied.  The patient was advised to apply ice later today and tomorrow to the injection sight as needed.  PROCEDURE NOTE:  The patient requests injections of the right knee , verbal consent was obtained.  The right knee was prepped appropriately after time out was performed.   Sterile technique was observed and injection of 1 cc of DepoMedrol 40mg  with several cc's of plain xylocaine. Anesthesia was provided by ethyl chloride and a 20-gauge needle was used to inject the knee area. The injection was tolerated well.  A band aid dressing was applied.  The patient was advised to apply ice later today and tomorrow to the injection sight as needed.  Encounter Diagnoses  Name Primary?   Chronic pain of both knees Yes   Morbid obesity (HCC)    Body mass index 45.0-49.9, adult (HCC)    She has more pain of the left knee with giving way.  She has been to PT. She has medial pain.  NV intact. ROM 0 to 105, positive medial McMurray, limp left, effusion, crepitus.  She is not improving despite conservative treatment.  I am concerned she has meniscus tear.  I will try to order MRI.  I have reviewed the 09-12-1983 Controlled Substance Reporting System web site prior to prescribing narcotic medicine for this patient.  I refilled her Naprosyn also.  Return in one month.  Call if any problem.  Precautions discussed.  Electronically Signed [place], MD 1/31/20238:07 AM

## 2021-06-08 NOTE — Addendum Note (Signed)
Addended by: Derek Mound A on: 06/08/2021 02:18 PM   Modules accepted: Orders

## 2021-06-15 ENCOUNTER — Ambulatory Visit (INDEPENDENT_AMBULATORY_CARE_PROVIDER_SITE_OTHER): Payer: Medicaid Other | Admitting: "Endocrinology

## 2021-06-15 ENCOUNTER — Other Ambulatory Visit: Payer: Self-pay

## 2021-06-15 ENCOUNTER — Encounter: Payer: Self-pay | Admitting: "Endocrinology

## 2021-06-15 DIAGNOSIS — I10 Essential (primary) hypertension: Secondary | ICD-10-CM | POA: Diagnosis not present

## 2021-06-15 DIAGNOSIS — R7303 Prediabetes: Secondary | ICD-10-CM | POA: Diagnosis not present

## 2021-06-15 LAB — POCT GLYCOSYLATED HEMOGLOBIN (HGB A1C): HbA1c, POC (prediabetic range): 5.3 % — AB (ref 5.7–6.4)

## 2021-06-15 NOTE — Progress Notes (Signed)
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   CESAREAN SECTION     COLPOSCOPY     Social History   Socioeconomic History   Marital status: Single    Spouse name: Not on file   Number of children: 3   Years of education: Not on file   Highest education level: Not on file  Occupational History   Not on file  Tobacco Use   Smoking status: Former    Packs/day: 0.25    Years: 2.00    Pack years: 0.50    Types: Cigarettes    Quit date: 12/09/2020    Years since quitting: 0.5   Smokeless tobacco: Never   Tobacco comments:    "2-3 cigs per day"  Vaping Use   Vaping Use: Never used  Substance and Sexual Activity   Alcohol use: No   Drug use: No   Sexual activity: Yes    Birth control/protection: None  Other Topics Concern   Not on file  Social History Narrative   Not on file   Social Determinants of Health   Financial Resource Strain: Not on file  Food Insecurity: Not on file  Transportation Needs: Not on file  Physical Activity: Not on file  Stress: Not on file  Social Connections: Not on file   Family History  Problem Relation Age of Onset   Cancer Other    Diabetes Other    Thyroid disease Mother    Hypertension Mother    Heart attack Mother    Cancer Maternal Grandmother    Asthma Maternal Grandmother    Arthritis Maternal Grandmother    Cancer Maternal Grandfather    Diabetes Maternal Grandfather    Thyroid disease Maternal Aunt    Outpatient Encounter Medications as of 06/15/2021  Medication Sig   acetaminophen (TYLENOL) 325 MG  tablet Take 650 mg by mouth every 6 (six) hours as needed.   cyclobenzaprine (FLEXERIL) 10 MG tablet Take 1 tablet (10 mg total) by mouth at bedtime. One tablet every night at bedtime as needed for spasm.   hydrochlorothiazide (HYDRODIURIL) 12.5 MG tablet Take 12.5 mg by mouth daily.   HYDROcodone-acetaminophen (NORCO/VICODIN) 5-325 MG tablet One tablet every six hours for pain.  Limit 7 days.   metFORMIN (GLUCOPHAGE-XR) 500 MG 24 hr tablet TAKE 1 TABLET(500 MG) BY MOUTH DAILY WITH BREAKFAST   naproxen (NAPROSYN) 500 MG tablet Take 1 tablet (500 mg total) by mouth 2 (two) times daily with a meal.   No facility-administered encounter medications on file as of 06/15/2021.   ALLERGIES: Allergies  Allergen Reactions   Bee Venom Swelling    VACCINATION STATUS: Immunization History  Administered Date(s) Administered   Tdap 08/03/2019    HPI [name] is 36 y.o. female who presents today  with a medical history as above. she is being seen in consultation for weight management/prediabetes requested by Benetta Spar, MD.   She is being managed with metformin for prediabetes.  See notes from previous visit.  She has dealt with heavy weight all of her adult life, weight 300+ during high school.  Recently due to lifestyle changes, she was able to lose 25 pounds.  She remains on metformin, point-of-care A1c of 5.3% today.    She has hypertension on treatment.  She has family history of diabetes type 2.  She denies endocrine dysfunction such as hypothyroidism or Cushing syndrome.  She admits to dietary indiscretions including consumption of sweetened beverages and sweet desserts.  She is active smoker.  She was able to get pregnant 4 times, has 3 live children.   She denies any major coronary artery disease diagnosed.  She underwent bladder reconstructive surgery  and cesarean sections.  Recently she has succeeded in smoking cessation.  Review of Systems  Constitutional: + Fluctuating  body weight, overall lost 25 pounds since May 2022 .  + fatigue, no subjective hyperthermia, no subjective hypothermia Eyes: no blurry vision, no xerophthalmia   Objective:    Vitals with BMI 06/15/2021 03/16/2021 03/16/2021  Height 5\' 6"  - 5\' 6"   Weight 306 lbs 10 oz - 300 lbs  BMI 49.51 - 48.44  Systolic 132 135 -  Diastolic 84 90 -  Pulse 64 73 -    BP 132/84    Pulse 64    Ht 5\' 6"  (1.676 m)    Wt (!) 306 lb 9.6 oz (139.1 kg)    BMI 49.49 kg/m   Wt Readings from Last 3 Encounters:  06/15/21 (!) 306 lb 9.6 oz (139.1 kg)  03/16/21 300 lb (136.1 kg)  03/05/21 (!) 306 lb (138.8 kg)    Physical Exam  Constitutional:  Body mass index is 49.49 kg/m.,  not in acute distress, normal state of mind Eyes: PERRLA, EOMI, no exophthalmos ENT: moist mucous membranes, no gross thyromegaly, no gross cervical lymphadenopathy   CMP ( most recent) CMP     Component Value Date/Time   NA 136 08/01/2019 2011   K 3.2 (L) 08/01/2019 2011   CL 104 08/01/2019 2011   CO2 23 08/01/2019 2011   GLUCOSE 108 (H) 08/01/2019 2011   BUN 7 08/01/2019 2011   CREATININE 0.50 08/01/2019 2011   CALCIUM 9.3 08/01/2019 2011   PROT 6.2 (L) 08/01/2019 2011   ALBUMIN 2.5 (L) 08/01/2019 2011   AST 19 08/01/2019 2011   ALT 20 08/01/2019 2011   ALKPHOS 67 08/01/2019 2011   BILITOT 0.4 08/01/2019 2011   GFRNONAA >60 08/01/2019 2011   GFRAA >60 08/01/2019 2011     Diabetic Labs (most recent): Lab Results  Component Value Date   HGBA1C 5.3 (A) 06/15/2021   HGBA1C 5.7 08/25/2020   HGBA1C 5.8 (H) 06/20/2019      Lab Results  Component Value Date   TSH 1.640 12/03/2020   TSH 1.460 11/08/2019   FREET4 1.13 12/03/2020      Assessment & Plan:   1. Morbid obesity (HCC) 2. Prediabetes 3. Essential hypertension, benign  This patient will benefit the most from lifestyle medicine.   - she acknowledges that there is a room for improvement in her food and drink choices. - Suggestion is made for her  to avoid simple carbohydrates  from her diet including Cakes, Sweet Desserts, Ice Cream, Soda (diet and regular), Sweet Tea, Candies, Chips, Cookies,  Store Bought Juices, Alcohol , Artificial Sweeteners,  Coffee Creamer, and "Sugar-free" Products, Lemonade. This will help patient to have more stable blood glucose profile and potentially avoid unintended weight gain.  The following Lifestyle Medicine recommendations according to American College of Lifestyle Medicine  [facility]) were discussed and and offered to patient and she  agrees to start the journey:  A. Whole Foods, Plant-Based Nutrition comprising of fruits and vegetables, plant-based proteins, whole-grain carbohydrates was discussed in detail with the patient.   A list for source of those nutrients were also provided to the patient.  Patient will use only water or unsweetened tea for hydration. B.  The need to stay away from risky substances including alcohol, smoking; obtaining 7 to 9 hours of restorative sleep, at least 150 minutes of moderate intensity exercise weekly, the importance of healthy social connections,  and stress management techniques were discussed. C.  A full color page of  Calorie density of various food groups per pound showing examples of each food groups was provided to the patient.   -Her insurance will not provide coverage for pharmaceutical agents,.  Further weight loss target of 10% will be 30 pounds in 1-2 years.  This is an acceptable target for her. -Optimal exercise regimen of long walks was discussed with her.  -She would benefit from consulting with a dietitian, arrangement will be made for her to see Norm Salt, CDE.    -Regarding her history of prediabetes, her A1c is better at 5.3% today.  She will continue to benefit from low-dose metformin.  She is advised to continue metformin 500 mg XR p.o. daily at breakfast.   She has successfully quit smoking.  She is advised to maintain her hydrochlorothiazide  for blood pressure management.  - she is advised to maintain close follow up with Benetta Spar, MD for primary care needs.    I spent 31 minutes in the care of the patient today including review of labs from Thyroid Function, CMP, and other relevant labs ; imaging/biopsy records (current and previous including abstractions from other facilities); face-to-face time discussing  her lab results and symptoms, medications doses, her options of short and long term treatment based on the latest standards of care / guidelines;   and documenting the encounter.  [employer]  participated in the discussions, expressed understanding, and voiced agreement with the above plans.  All questions were answered to her satisfaction. she is encouraged to contact clinic should she have any questions or concerns prior to her return visit.    Follow up plan: Return in about 6 months (around 12/13/2021) for F/U with Pre-visit Labs, A1c -NV.   Marquis Lunch, MD [facility] [address] [place], [place] [address] Phone: [phone]  Fax: [phone]     06/15/2021, 2:24 PM  This note was partially dictated with voice recognition software. Similar sounding words can be transcribed inadequately or may not  be corrected upon review.

## 2021-06-15 NOTE — Patient Instructions (Signed)

## 2021-06-16 ENCOUNTER — Ambulatory Visit: Payer: Medicaid Other | Admitting: Orthopaedic Surgery

## 2021-06-18 DIAGNOSIS — N390 Urinary tract infection, site not specified: Secondary | ICD-10-CM | POA: Diagnosis not present

## 2021-06-23 ENCOUNTER — Ambulatory Visit: Payer: Medicaid Other | Admitting: Orthopaedic Surgery

## 2021-06-24 ENCOUNTER — Other Ambulatory Visit: Payer: Self-pay

## 2021-06-24 ENCOUNTER — Ambulatory Visit (HOSPITAL_COMMUNITY)
Admission: RE | Admit: 2021-06-24 | Discharge: 2021-06-24 | Disposition: A | Discharge status: Home / Self Care | Payer: Medicaid Other | Source: Ambulatory Visit | Attending: Orthopaedic Surgery | Admitting: Orthopaedic Surgery

## 2021-06-24 DIAGNOSIS — M23242 Derangement of anterior horn of lateral meniscus due to old tear or injury, left knee: Secondary | ICD-10-CM | POA: Diagnosis not present

## 2021-06-24 DIAGNOSIS — M25562 Pain in left knee: Secondary | ICD-10-CM | POA: Diagnosis not present

## 2021-06-24 DIAGNOSIS — G8929 Other chronic pain: Secondary | ICD-10-CM | POA: Diagnosis not present

## 2021-06-24 DIAGNOSIS — M25561 Pain in right knee: Secondary | ICD-10-CM | POA: Insufficient documentation

## 2021-06-24 IMAGING — MR MR KNEE*L* W/O CM
8 series · 40 of 40 positions shown · non-contrast
Comparison: X-ray knee [DATE].

CLINICAL DATA: Chronic bilateral knee pain. Clinical concern for
possible tear.

EXAM:
MRI OF THE LEFT KNEE WITHOUT CONTRAST
TECHNIQUE: Multiplanar, multisequence MR imaging of the knee was performed. No
intravenous contrast was administered.

[Series 8: T2 fat-sat · axial · left · 4.0mm · 0.47mm/px · z∈[-75,+49]mm · 6 of 26 slices shown (1 of 4)]
[im 1/26]
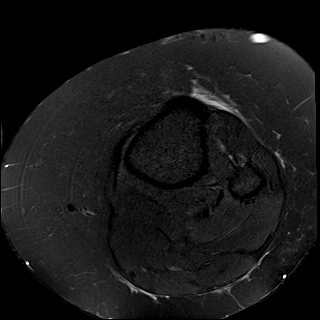
[im 6/26]
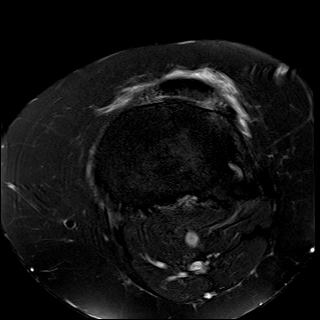
[im 11/26]
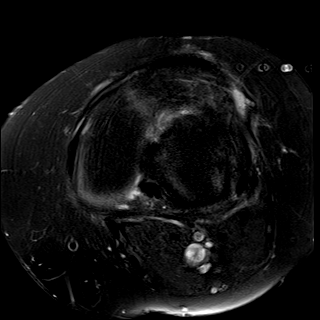
[im 16/26]
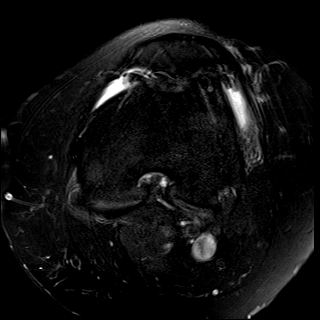
[im 21/26]
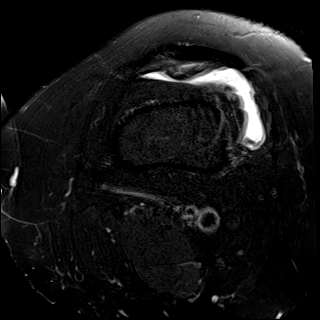
[im 26/26]
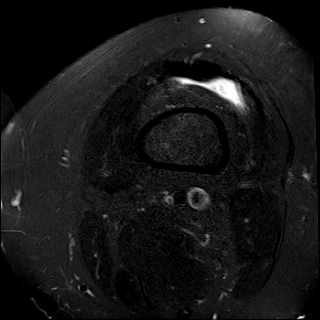

[Series 9: T1 · coronal · left · 4.0mm · 0.59mm/px · 5 of 24 slices shown]
[im 1/24]
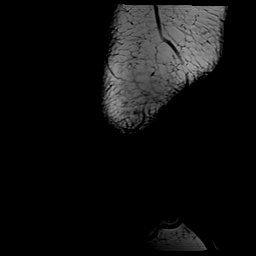
[im 6/24]
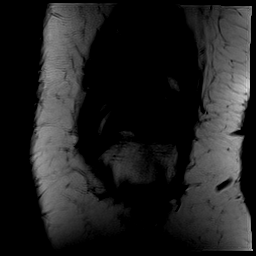
[im 12/24]
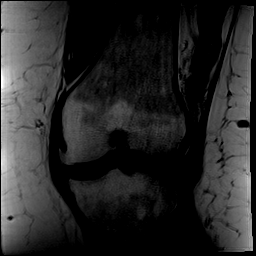
[im 18/24]
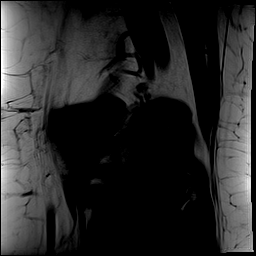
[im 24/24]
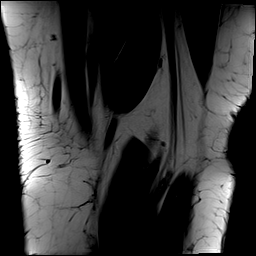

[Series 10: T2 fat-sat · coronal · left · 4.0mm · 0.59mm/px · 5 of 27 slices shown (2 of 4)]
[im 1/27]
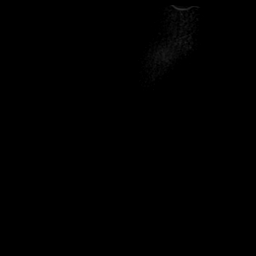
[im 7/27]
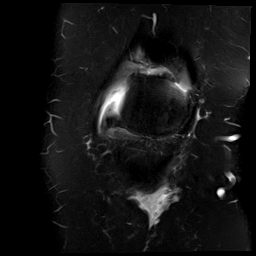
[im 14/27]
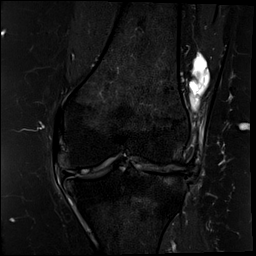
[im 20/27]
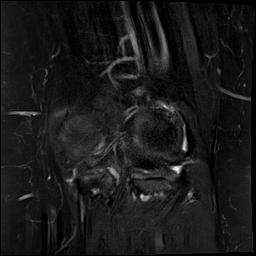
[im 27/27]
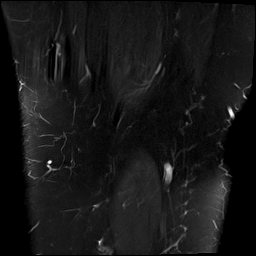

[Series 11: PD fat-sat · coronal · left · 4.0mm · 0.59mm/px · 5 of 28 slices shown (1 of 2)]
[im 1/28]
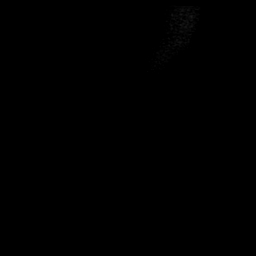
[im 7/28]
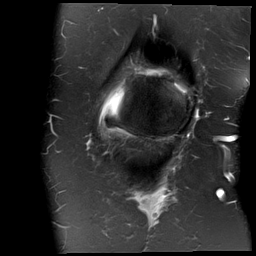
[im 14/28]
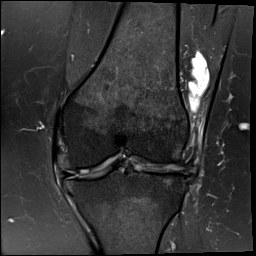
[im 21/28]
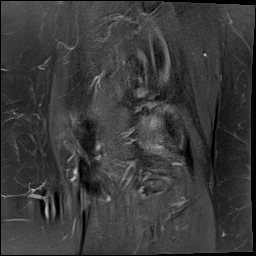
[im 28/28]
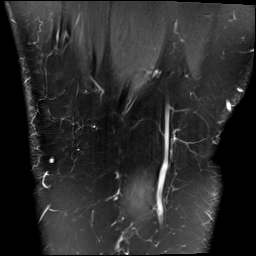

[Series 12: PD fat-sat · sagittal · left · 3.0mm · 0.52mm/px · 7 of 36 slices shown (2 of 2)]
[im 1/36]
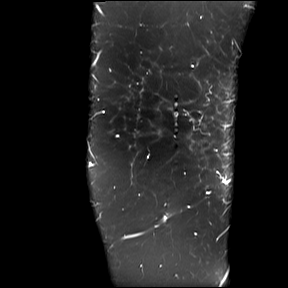
[im 6/36]
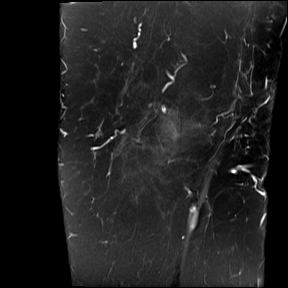
[im 12/36]
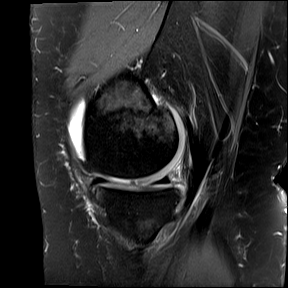
[im 18/36]
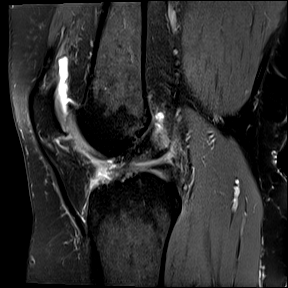
[im 24/36]
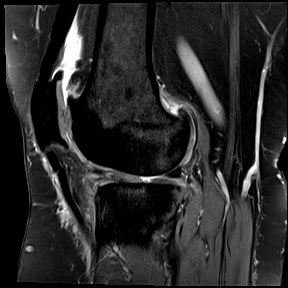
[im 30/36]
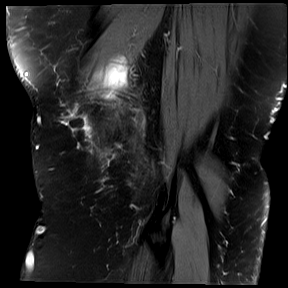
[im 36/36]
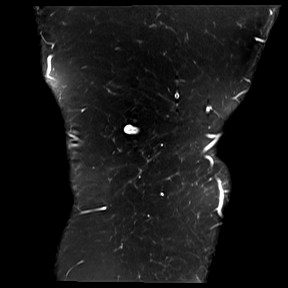

[Series 13: T2 fat-sat · sagittal · left · 3.0mm · 0.59mm/px · 5 of 28 slices shown (3 of 4)]
[im 1/28]
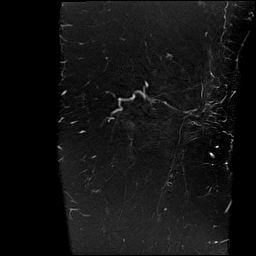
[im 7/28]
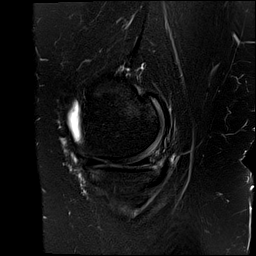
[im 14/28]
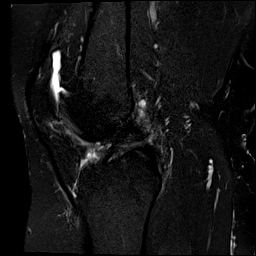
[im 21/28]
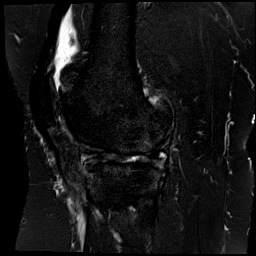
[im 28/28]
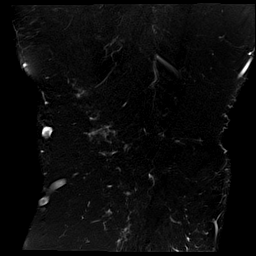

[Series 14: T2 fat-sat · axial · left · 4.0mm · 0.59mm/px · z∈[-75,+49]mm · 5 of 26 slices shown (4 of 4)]
[im 1/26]
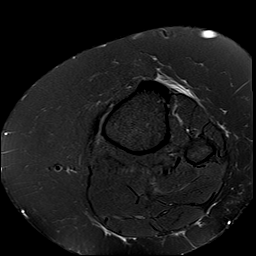
[im 7/26]
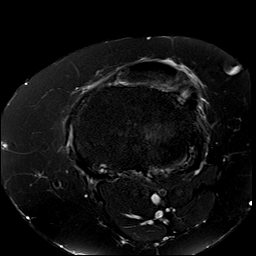
[im 13/26]
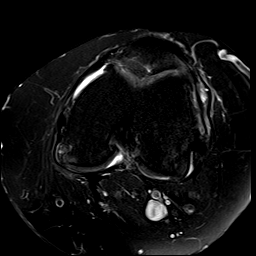
[im 19/26]
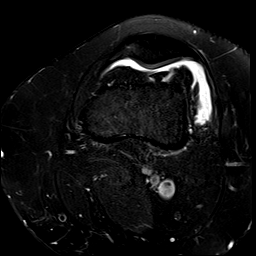
[im 26/26]
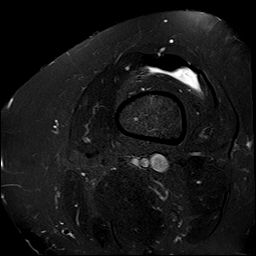

[Series 15: PD · coronal · left · 2.0mm · 0.47mm/px · 2 of 10 slices shown]
[im 1/10]
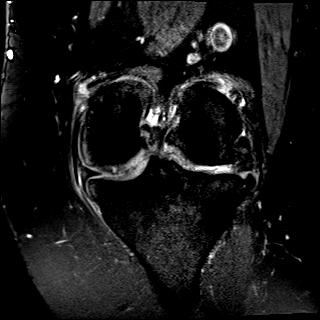
[im 10/10]
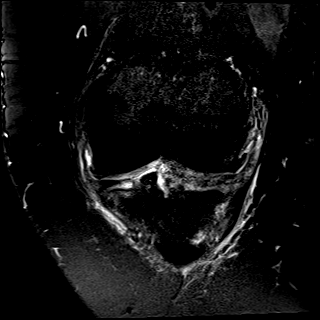

[40 of 40 positions shown; findings below may reference images not displayed]

FINDINGS: MENISCI

Medial: No definitive medial meniscus tear.

Lateral: Complex degenerative tear ring of the anterior horn and
body of the lateral meniscus with a macerated appearance. The
anterior root is torn. There is 6 mm lateral extrusion. There is an
adjacent parameniscal cyst at the anterolateral aspect of the knee
measuring 1.2 x 1.1 cm (sagittal PD image 27).

LIGAMENTS

Cruciates: ACL and PCL are intact.

Collaterals: Medial collateral ligament is intact. Lateral
collateral ligament complex is intact.

CARTILAGE

Patellofemoral:  Moderate chondrosis.

Medial: There is a 7 x 9 mm higher chondral defect along the
weight-bearing medial femoral condyle (coronal T2 image 16, sagittal
T2 image 8).

Lateral: Severe chondrosis with near full-thickness cartilage loss
along the weight-bearing surface.

JOINT: Moderate size joint effusion.

POPLITEAL FOSSA: Miniscule Baker cyst.

EXTENSOR MECHANISM: Intact quadriceps tendon. Intact patellar
tendon.

BONES: Tricompartment osteophyte formation. No acute fracture or
dislocation. No aggressive osseous lesion.

Other: No fluid collection or hematoma. Muscles are normal.
IMPRESSION: Complex degenerative tearing of the anterior horn and body of the
lateral meniscus with a macerated appearance, torn anterior root,
and lateral extrusion. Adjacent parameniscal cyst.

Tricompartment osteoarthritis, worst in the lateral compartment,
cartilaginous abnormalities as described above.

Moderate-sized joint effusion.

## 2021-06-30 ENCOUNTER — Ambulatory Visit: Payer: Medicaid Other | Admitting: Orthopaedic Surgery

## 2021-06-30 ENCOUNTER — Telehealth: Payer: Self-pay | Admitting: Orthopaedic Surgery

## 2021-06-30 DIAGNOSIS — L814 Other melanin hyperpigmentation: Secondary | ICD-10-CM | POA: Diagnosis not present

## 2021-06-30 DIAGNOSIS — D2239 Melanocytic nevi of other parts of face: Secondary | ICD-10-CM | POA: Diagnosis not present

## 2021-06-30 DIAGNOSIS — L821 Other seborrheic keratosis: Secondary | ICD-10-CM | POA: Diagnosis not present

## 2021-06-30 MED ORDER — CYCLOBENZAPRINE HCL 10 MG PO TABS: 10.0000 mg | ORAL_TABLET | Freq: Every day | ORAL | 0 refills | Status: DC

## 2021-06-30 MED ORDER — HYDROCODONE-ACETAMINOPHEN 5-325 MG PO TABS: ORAL_TABLET | ORAL | 0 refills | Status: DC

## 2021-07-02 ENCOUNTER — Ambulatory Visit (INDEPENDENT_AMBULATORY_CARE_PROVIDER_SITE_OTHER): Payer: Medicaid Other | Admitting: Orthopaedic Surgery

## 2021-07-02 ENCOUNTER — Encounter: Payer: Self-pay | Admitting: Orthopaedic Surgery

## 2021-07-02 ENCOUNTER — Other Ambulatory Visit: Payer: Self-pay

## 2021-07-02 DIAGNOSIS — M5440 Lumbago with sciatica, unspecified side: Secondary | ICD-10-CM | POA: Diagnosis not present

## 2021-07-02 DIAGNOSIS — M7061 Trochanteric bursitis, right hip: Secondary | ICD-10-CM | POA: Diagnosis not present

## 2021-07-02 DIAGNOSIS — G8929 Other chronic pain: Secondary | ICD-10-CM

## 2021-07-02 DIAGNOSIS — M25562 Pain in left knee: Secondary | ICD-10-CM

## 2021-07-02 DIAGNOSIS — Z6841 Body Mass Index (BMI) 40.0 and over, adult: Secondary | ICD-10-CM | POA: Diagnosis not present

## 2021-07-02 NOTE — Progress Notes (Signed)
My left knee is hurting more. ? ?She had MRI of the left knee showing: ?IMPRESSION: ?Complex degenerative tearing of the anterior horn and body of the ?lateral meniscus with a macerated appearance, torn anterior root, ?and lateral extrusion. Adjacent parameniscal cyst. ?  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 ?  ?Moderate-sized joint effusion. ? ?I have explained the findings to her.  I will have her see Dr. Romeo Apple for possible arthroscopy of the knee.  She agrees. ? ?I have independently reviewed the MRI.   ? ?She looked at the MRI herself per her request. ? ?Her right hip is tender over the trochanteric area.  She has pain laterally but no redness or swelling.  ROM of the right hip is full. ? ?Encounter Diagnoses  ?Name Primary?  ? Chronic pain of left knee Yes  ? Chronic left-sided low back pain with sciatica, sciatica laterality unspecified   ? Trochanteric bursitis, right hip   ? Morbid obesity (HCC)   ? Body mass index 45.0-49.9, adult (HCC)   ? ?PROCEDURE NOTE: ? ?The patient requests injections of the left knee , verbal consent was obtained. ? ?The left knee was prepped appropriately after time out was performed.  ? ?Sterile technique was observed and injection of 1 cc of DepoMedrol 40 mg with several cc's of plain xylocaine. Anesthesia was provided by ethyl chloride and a 20-gauge needle was used to inject the knee area. The injection was tolerated well.  A band aid dressing was applied. ? ?The patient was advised to apply ice later today and tomorrow to the injection sight as needed. ? ?PROCEDURE NOTE: ? ?The patient request injection, verbal consent was obtained. ? ?The right trochanteric area of the hip was prepped appropriately after time out was performed.  ? ?Sterile technique was observed and injection of 1 cc of DepoMedrol 40 mg with several cc's of plain xylocaine. Anesthesia was provided by ethyl chloride and a 20-gauge  needle was used to inject the hip area. The injection was tolerated well. ? ?A band aid dressing was applied. ? ?The patient was advised to apply ice later today and tomorrow to the injection sight as needed. ? ?I will see in six weeks for her hip. ? ?She is to see Dr. Romeo Apple for the left knee. ? ?Call if any problem. ? ?Precautions discussed. ? ?Electronically Signed ?Darreld Mclean, MD ?3/16/20238:33 AM ? ?

## 2021-07-08 ENCOUNTER — Ambulatory Visit: Payer: Medicaid Other | Admitting: Urology

## 2021-07-08 ENCOUNTER — Other Ambulatory Visit: Payer: Self-pay

## 2021-07-08 ENCOUNTER — Encounter: Payer: Self-pay | Admitting: Urology

## 2021-07-08 VITALS — BP 121/80 | HR 73 | Ht 66.0 in | Wt 302.0 lb

## 2021-07-08 DIAGNOSIS — N390 Urinary tract infection, site not specified: Secondary | ICD-10-CM | POA: Diagnosis not present

## 2021-07-08 DIAGNOSIS — R8290 Unspecified abnormal findings in urine: Secondary | ICD-10-CM | POA: Diagnosis not present

## 2021-07-08 DIAGNOSIS — R339 Retention of urine, unspecified: Secondary | ICD-10-CM | POA: Diagnosis not present

## 2021-07-08 DIAGNOSIS — Z87718 Personal history of other specified (corrected) congenital malformations of genitourinary system: Secondary | ICD-10-CM

## 2021-07-08 DIAGNOSIS — N200 Calculus of kidney: Secondary | ICD-10-CM | POA: Diagnosis not present

## 2021-07-08 LAB — MICROSCOPIC EXAMINATION: Renal Epithel, UA: NONE SEEN /hpf

## 2021-07-08 LAB — URINALYSIS, ROUTINE W REFLEX MICROSCOPIC
Bilirubin, UA: NEGATIVE
Glucose, UA: NEGATIVE
Ketones, UA: NEGATIVE
Nitrite, UA: NEGATIVE
Protein,UA: NEGATIVE
Specific Gravity, UA: 1.025 (ref 1.005–1.030)
Urobilinogen, Ur: 0.2 mg/dL (ref 0.2–1.0)
pH, UA: 7 (ref 5.0–7.5)

## 2021-07-08 LAB — BLADDER SCAN AMB NON-IMAGING: Scan Result: 282

## 2021-07-08 NOTE — Progress Notes (Signed)
Mdx urine culture  ?Tracking [id number] [phone] [id number] ? ?Pick up number [id number] ?

## 2021-07-08 NOTE — Progress Notes (Signed)
? ?Assessment: ?1. Frequent UTI   ?2. History of exstrophy of bladder   ?3. Incomplete bladder emptying   ?4. Nephrolithiasis   ?5. Abnormal urine findings   ? ? ?Plan: ?Request records from [facility] regarding prior bladder surgery. ?Methods to reduce the risk of UTIs discussed with the patient including increased fluid intake, timed and double voiding, daily cranberry supplement, and daily probiotic. ?Resolve Mdx urine culture today ?Recommend further evaluation with CT imaging given history of bladder exstrophy, frequent UTIs, and kidney stones. ?Schedule for cystoscopy after CT. ? ?Chief Complaint:  ?Chief Complaint  ?Patient presents with  ? Recurrent UTI  ? ? ?History of Present Illness: ? ?[name] is a 36 y.o. year old female who is seen in consultation from Melody Comas, NP for evaluation of recurrent UTI's.  She reports a history of UTIs approximately every 3 months for the past several years.  Typical UTI symptoms include frequency, dysuria, bladder pressure.  Her symptoms typically resolve when she is treated with antibiotics.  She was last treated with Macrodantin approximately 1 week ago.  No current UTI symptoms.  She does have baseline frequency, some urgency, and nocturia 2-3 times.  She reports occasional incontinence associated with her UTIs.  No stress incontinence.  She does have a history of kidney stones.  No recent imaging available.  She underwent bladder reconstruction at at age 44-1 years for apparent bladder exstrophy.  This was done at [facility].  No records available. ? ?Urine culture results: ?11/22 No growth ?3/23 No growth ? ?She reports some mild right-sided back/flank pain. ? ?Past Medical History:  ?Past Medical History:  ?Diagnosis Date  ? Arthritis   ? Hypertension   ? Kidney infection   ? UTI (urinary tract infection)   ? Vaginal Pap smear, abnormal   ? ? ?Past Surgical History:  ?Past Surgical History:  ?Procedure Laterality Date  ? BLADDER SURGERY    ? bladder extrophy  surgery as a child  ? CESAREAN SECTION    ? COLPOSCOPY    ? ? ?Allergies:  ?Allergies  ?Allergen Reactions  ? Bee Venom Swelling  ? ? ?Family History:  ?Family History  ?Problem Relation Age of Onset  ? Cancer Other   ? Diabetes Other   ? Thyroid disease Mother   ? Hypertension Mother   ? Heart attack Mother   ? Cancer Maternal Grandmother   ? Asthma Maternal Grandmother   ? Arthritis Maternal Grandmother   ? Cancer Maternal Grandfather   ? Diabetes Maternal Grandfather   ? Thyroid disease Maternal Aunt   ? ? ?Social History:  ?Social History  ? ?Tobacco Use  ? Smoking status: Former  ?  Packs/day: 0.25  ?  Years: 2.00  ?  Pack years: 0.50  ?  Types: Cigarettes  ?  Quit date: 12/09/2020  ?  Years since quitting: 0.5  ? Smokeless tobacco: Never  ? Tobacco comments:  ?  "2-3 cigs per day"  ?Vaping Use  ? Vaping Use: Never used  ?Substance Use Topics  ? Alcohol use: No  ? Drug use: No  ? ? ?Review of symptoms:  ?Constitutional:  Negative for unexplained weight loss, night sweats, fever, chills ?ENT:  Negative for nose bleeds, sinus pain, painful swallowing ?CV:  Negative for chest pain, shortness of breath, exercise intolerance, palpitations, loss of consciousness ?Resp:  Negative for cough, wheezing, shortness of breath ?GI:  Negative for nausea, vomiting, diarrhea, bloody stools ?GU:  Positives noted in HPI; otherwise  negative for gross hematuria ?Neuro:  Negative for seizures, poor balance, limb weakness, slurred speech ?Psych:  Negative for lack of energy, depression, anxiety ?Endocrine:  Negative for polydipsia, polyuria, symptoms of hypoglycemia (dizziness, hunger, sweating) ?Hematologic:  Negative for anemia, purpura, petechia, prolonged or excessive bleeding, use of anticoagulants  ?Allergic:  Negative for difficulty breathing or choking as a result of exposure to anything; no shellfish allergy; no allergic response (rash/itch) to materials, foods ? ?Physical exam: ?BP 121/80   Pulse 73   Ht 5\' 6"  (1.676 m)    Wt (!) 302 lb (137 kg)   BMI 48.74 kg/m?  ?GENERAL APPEARANCE:  Well appearing, well developed, well nourished, NAD ?HEENT: Atraumatic, Normocephalic, oropharynx clear. ?NECK: Supple without lymphadenopathy or thyromegaly. ?LUNGS: Clear to auscultation bilaterally. ?HEART: Regular Rate and Rhythm without murmurs, gallops, or rubs. ?ABDOMEN: Soft, non-tender, No Masses. ?EXTREMITIES: Moves all extremities well.  Without clubbing, cyanosis, or edema. ?NEUROLOGIC:  Alert and oriented x 3, normal gait, CN II-XII grossly intact.  ?MENTAL STATUS:  Appropriate. ?BACK:  Non-tender to palpation.  No CVAT ?SKIN:  Warm, dry and intact.   ? ?Results: ?U/A:  6-10 WBC, 11-30 RBC, mod bacteria ? ?Results for orders placed or performed in visit on 07/08/21 (from the past 24 hour(s))  ?BLADDER SCAN AMB NON-IMAGING  ? Collection Time: 07/08/21  8:49 AM  ?Result Value Ref Range  ? Scan Result 282   ? ? ?

## 2021-07-10 ENCOUNTER — Encounter: Payer: Self-pay | Admitting: Urology

## 2021-07-10 ENCOUNTER — Other Ambulatory Visit: Payer: Self-pay | Admitting: Urology

## 2021-07-15 ENCOUNTER — Ambulatory Visit: Payer: Medicaid Other | Admitting: Orthopedic Surgery

## 2021-07-17 ENCOUNTER — Telehealth: Payer: Self-pay | Admitting: Orthopaedic Surgery

## 2021-07-29 ENCOUNTER — Ambulatory Visit: Payer: Medicaid Other | Admitting: Orthopedic Surgery

## 2021-07-29 ENCOUNTER — Encounter: Payer: Self-pay | Admitting: Orthopedic Surgery

## 2021-07-29 VITALS — BP 163/103 | HR 74 | Ht 66.0 in | Wt 311.2 lb

## 2021-07-29 DIAGNOSIS — M1712 Unilateral primary osteoarthritis, left knee: Secondary | ICD-10-CM

## 2021-07-29 DIAGNOSIS — G8929 Other chronic pain: Secondary | ICD-10-CM | POA: Diagnosis not present

## 2021-07-29 DIAGNOSIS — S83282A Other tear of lateral meniscus, current injury, left knee, initial encounter: Secondary | ICD-10-CM | POA: Diagnosis not present

## 2021-07-29 DIAGNOSIS — M7061 Trochanteric bursitis, right hip: Secondary | ICD-10-CM | POA: Diagnosis not present

## 2021-07-29 NOTE — Patient Instructions (Addendum)
GLUCOSAMINE CHONDROITIN ARTHRITIS SUPPLEMENT  ? ? ?We will send your records to [facility], please give them a call to schedule your appointment: ?[facility] ph: [phone] ?

## 2021-07-29 NOTE — Progress Notes (Signed)
Chief Complaint  ?Patient presents with  ? consultation  ?  LT knee/ referred by Dr. Luna Glasgow  ? ? ?HPI: 36 year old female presents for evaluation of possible knee arthroscopy ? ?She complains of diffuse pain in her left knee ? ?She is already had an MRI the findings are noted below. ?I also have an MRI reading of my own ? 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  I do not think arthroscopy can help her at this time.  Although it would address the torn meniscal tissue it would not address the arthritis and the progressively worsening valgus deformity although she says she has been not need her whole life ? ?Her plain films show grade 3 disease if not grade 4 disease so I have advised her to go to [facility] for possible treatment intervention which could include weight loss management with a BMI of 50 ? ?She is not a surgical candidate for knee replacement based on age or weight ? ?She may be a candidate for hyaluronic acid injection but that of course would be temporary and the grade of disease that she has may not even be effective ? ?Very difficult situation for this nice 36 year old lady ? ?Past Medical History:  ?Diagnosis Date  ? Arthritis   ? Hypertension   ? Kidney infection   ? UTI (urinary tract infection)   ? Vaginal Pap smear, abnormal   ? ? ?BP (!) 163/103   Pulse 74   Ht 5\' 6"  (1.676 m)   Wt (!) 311 lb 3.2 oz (141.2 kg)   BMI 50.23 kg/m?  ? ? ?General appearance: Well-developed well-nourished no gross deformities obesity noted ?The patient meets the AMA guidelines for Morbid (severe) obesity with a BMI > 40.0 and I have recommended weight loss. ?Cardiovascular normal pulse and perfusion normal color without edema ?Neurologically no sensation loss or deficits or pathologic reflexes ?Psychological: Awake alert and oriented x3 mood and affect normal ?Skin no lacerations or ulcerations no nodularity no palpable masses, no erythema or nodularity ? ?Imaging my interpretation of the MRI is  that she has extensive cartilage loss in the lateral compartment there is an extruded lateral meniscus with what appears to be complex degenerative tear anterior horn and body although anterior horn meniscal tears are over read on MRI's. ? ?A/P ? ?Again my recommendation is for her to go to a tertiary care center where she can perhaps go into some type of study program maybe get the hyaluronic acid medication from them maybe there is a program she can be started and she is on Medicaid for insurance ? ?I really have no treatments to offer her other than advise weight loss ? ?Tylenol ? ?Anti-inflammatories ?

## 2021-08-03 DIAGNOSIS — M7061 Trochanteric bursitis, right hip: Secondary | ICD-10-CM | POA: Diagnosis not present

## 2021-08-03 DIAGNOSIS — G8929 Other chronic pain: Secondary | ICD-10-CM | POA: Diagnosis not present

## 2021-08-03 DIAGNOSIS — M25561 Pain in right knee: Secondary | ICD-10-CM | POA: Diagnosis not present

## 2021-08-03 DIAGNOSIS — S83282A Other tear of lateral meniscus, current injury, left knee, initial encounter: Secondary | ICD-10-CM | POA: Diagnosis not present

## 2021-08-03 DIAGNOSIS — M25562 Pain in left knee: Secondary | ICD-10-CM | POA: Diagnosis not present

## 2021-08-06 ENCOUNTER — Other Ambulatory Visit: Payer: Medicaid Other | Admitting: Urology

## 2021-08-10 DIAGNOSIS — M25562 Pain in left knee: Secondary | ICD-10-CM | POA: Diagnosis not present

## 2021-08-10 DIAGNOSIS — G8929 Other chronic pain: Secondary | ICD-10-CM | POA: Diagnosis not present

## 2021-08-11 DIAGNOSIS — E119 Type 2 diabetes mellitus without complications: Secondary | ICD-10-CM | POA: Insufficient documentation

## 2021-08-11 DIAGNOSIS — I10 Essential (primary) hypertension: Secondary | ICD-10-CM | POA: Insufficient documentation

## 2021-08-11 DIAGNOSIS — M1990 Unspecified osteoarthritis, unspecified site: Secondary | ICD-10-CM | POA: Insufficient documentation

## 2021-08-13 ENCOUNTER — Ambulatory Visit (INDEPENDENT_AMBULATORY_CARE_PROVIDER_SITE_OTHER): Payer: Medicaid Other

## 2021-08-13 ENCOUNTER — Ambulatory Visit: Payer: Medicaid Other | Admitting: Orthopaedic Surgery

## 2021-08-13 ENCOUNTER — Encounter: Payer: Self-pay | Admitting: Orthopaedic Surgery

## 2021-08-13 DIAGNOSIS — M25551 Pain in right hip: Secondary | ICD-10-CM

## 2021-08-13 DIAGNOSIS — M7061 Trochanteric bursitis, right hip: Secondary | ICD-10-CM

## 2021-08-13 DIAGNOSIS — M5440 Lumbago with sciatica, unspecified side: Secondary | ICD-10-CM

## 2021-08-13 DIAGNOSIS — M25562 Pain in left knee: Secondary | ICD-10-CM | POA: Diagnosis not present

## 2021-08-13 DIAGNOSIS — G8929 Other chronic pain: Secondary | ICD-10-CM

## 2021-08-13 DIAGNOSIS — Z6841 Body Mass Index (BMI) 40.0 and over, adult: Secondary | ICD-10-CM | POA: Diagnosis not present

## 2021-08-13 DIAGNOSIS — M25561 Pain in right knee: Secondary | ICD-10-CM | POA: Diagnosis not present

## 2021-08-13 MED ORDER — HYDROCODONE-ACETAMINOPHEN 5-325 MG PO TABS: ORAL_TABLET | ORAL | 0 refills | Status: DC

## 2021-08-13 NOTE — Progress Notes (Signed)
My hip and back are hurting. ? ?She saw Dr. Romeo Apple for her knees and then was seen at [facility] for further evaluations.  She was seen there twice.  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  They told her she was too young for total knees.  She has a new appointment with another doctor there soon for further evaluation. ? ?She has right hip pain and lower back pain.  Pain in back goes to the right hip.  She has no new trauma.  Her pain is more in groin area and not laterally like it was before. ? ?ROM of hip on right is good.  NV intact.  She has no tenderness laterally. ? ?Spine/Pelvis examination: ? Inspection:  Overall, sacoiliac joint benign and hips nontender; without crepitus or defects. ? ? Thoracic spine inspection: Alignment normal without kyphosis present ? ? Lumbar spine inspection:  Alignment  with normal lumbar lordosis, without scoliosis apparent. ? ? Thoracic spine palpation:  without tenderness of spinal processes ? ? Lumbar spine palpation: without tenderness of lumbar area; without tightness of lumbar muscles  ? ? Range of Motion: ?  Lumbar flexion, forward flexion is normal without pain or tenderness  ?  Lumbar extension is full without pain or tenderness ?  Left lateral bend is normal without pain or tenderness ?  Right lateral bend is normal without pain or tenderness ?  Straight leg raising is normal ? Strength & tone: normal ? ? Stability overall normal stability ? ?Knees have bilateral effusion, crepitus, ROM of both is 0 to 110.  Knees are stable.  NV intact.  No distal edema. ? ?X-rays were done of the right hip and lower back, reported separately. ? 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 ? ?Encounter Diagnoses  ?Name Primary?  ? Trochanteric bursitis, right hip Yes  ? Chronic left-sided low back pain with sciatica, sciatica laterality unspecified   ? Chronic right hip pain   ? Morbid obesity (HCC)   ? Body mass  index 45.0-49.9, adult (HCC)   ? ?I have talked about X-rays of hip and recommended MRI. ? ?I will be out of town most likely when the results are done, and she can wait or see Dr. Romeo Apple in my absence. ? ?PROCEDURE NOTE: ? ?The patient requests injections of the left knee , verbal consent was obtained. ? ?The left knee was prepped appropriately after time out was performed.  ? ?Sterile technique was observed and injection of 1 cc of DepoMedrol 40 mg with several cc's of plain xylocaine. Anesthesia was provided by ethyl chloride and a 20-gauge needle was used to inject the knee area. The injection was tolerated well.  A band aid dressing was applied. ? ?The patient was advised to apply ice later today and tomorrow to the injection sight as needed. ?PROCEDURE NOTE: ? ?The patient requests injections of the right knee , verbal consent was obtained. ? ?The right knee was prepped appropriately after time out was performed.  ? ?Sterile technique was observed and injection of 1 cc of DepoMedrol 40mg  with several cc's of plain xylocaine. Anesthesia was provided by ethyl chloride and a 20-gauge needle was used to inject the knee area. The injection was tolerated well.  A band aid dressing was applied. ? ?The patient was advised to apply ice later today and tomorrow to the injection sight as needed. ? ?Return in three to six weeks. ? ?Get the MRI. ? ?  I have reviewed the [place] Controlled Substance Reporting System web site prior to prescribing narcotic medicine for this patient. ? ? ? ?Call if any problem. ? ?Precautions discussed. ? ?Electronically Signed ?Darreld Mclean, MD ?4/27/202310:46 AM ? ?

## 2021-08-14 ENCOUNTER — Ambulatory Visit (HOSPITAL_BASED_OUTPATIENT_CLINIC_OR_DEPARTMENT_OTHER)
Admission: RE | Admit: 2021-08-14 | Discharge: 2021-08-14 | Disposition: A | Discharge status: Home / Self Care | Payer: Medicaid Other | Source: Ambulatory Visit | Attending: Urology | Admitting: Urology

## 2021-08-14 DIAGNOSIS — K8020 Calculus of gallbladder without cholecystitis without obstruction: Secondary | ICD-10-CM | POA: Diagnosis not present

## 2021-08-14 DIAGNOSIS — N390 Urinary tract infection, site not specified: Secondary | ICD-10-CM | POA: Insufficient documentation

## 2021-08-14 DIAGNOSIS — N200 Calculus of kidney: Secondary | ICD-10-CM | POA: Diagnosis not present

## 2021-08-14 DIAGNOSIS — Z87718 Personal history of other specified (corrected) congenital malformations of genitourinary system: Secondary | ICD-10-CM

## 2021-08-14 DIAGNOSIS — K760 Fatty (change of) liver, not elsewhere classified: Secondary | ICD-10-CM | POA: Diagnosis not present

## 2021-08-14 IMAGING — CT CT RENAL STONE PROTOCOL
2 of 4 series · 16 of 46 positions shown, 18 images · non-contrast
Comparison: None.

CLINICAL DATA: Right flank pain, history of kidney stones. Bladder
reconstruction due to a birth defect. Frequent urinary tract
infections.



[Series 2: stone full · axial · 0.98mm/px · z∈[-672,-242]mm · 13 of 94 slices shown, 15 images]
[im 4/94  soft-tissue]
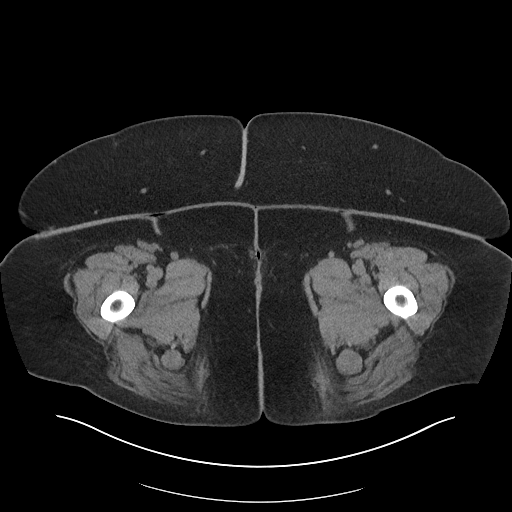
[im 4/94  bone]
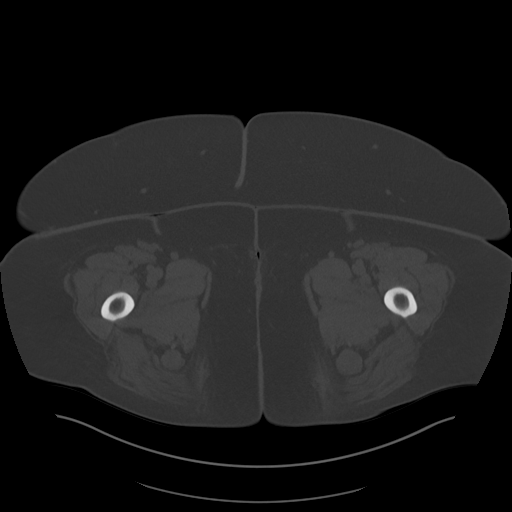
[im 12/94  soft-tissue]
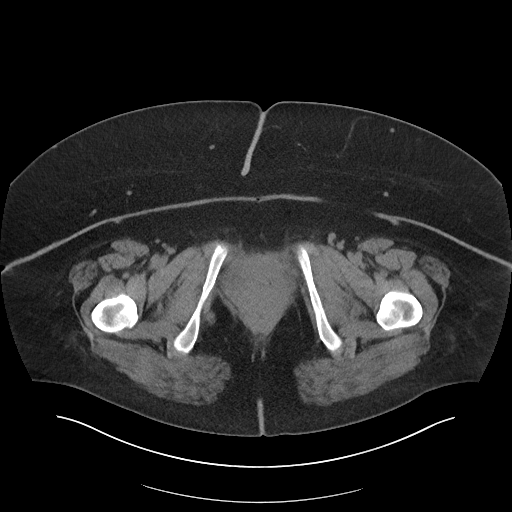
[im 19/94  soft-tissue]
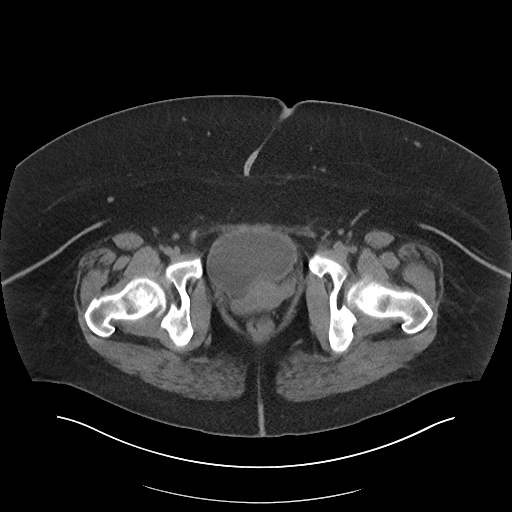
[im 27/94  soft-tissue]
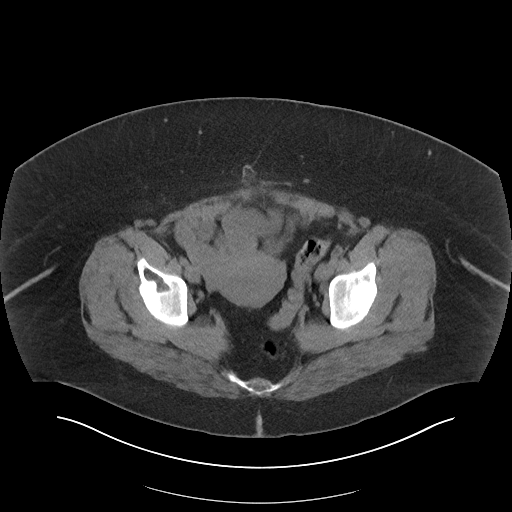
[im 34/94  soft-tissue]
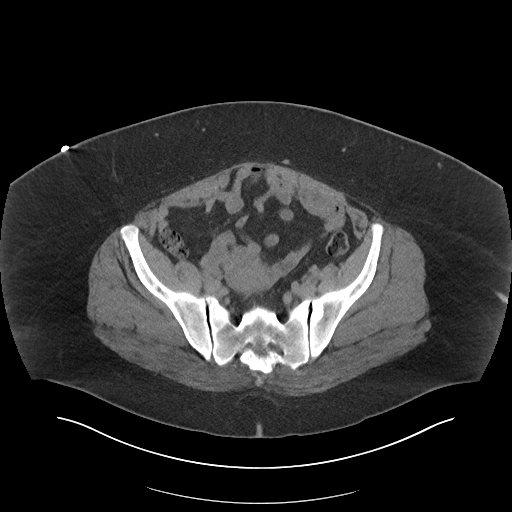
[im 41/94  soft-tissue]
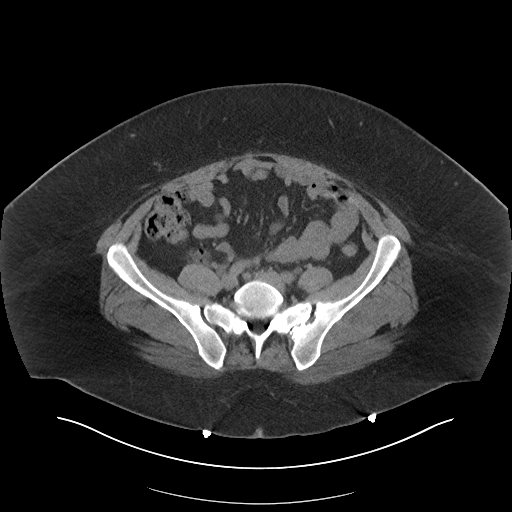
[im 49/94  soft-tissue]
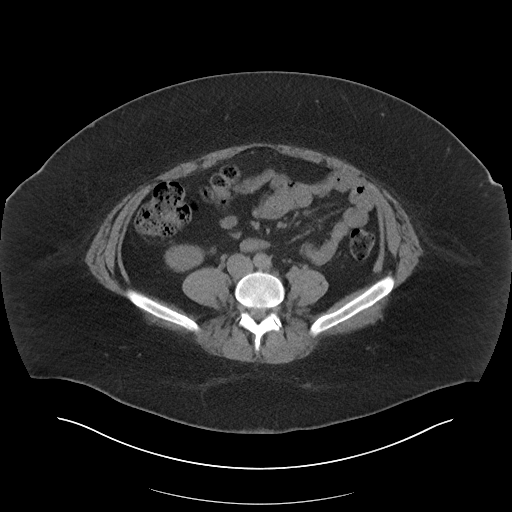
[im 53/94  soft-tissue]
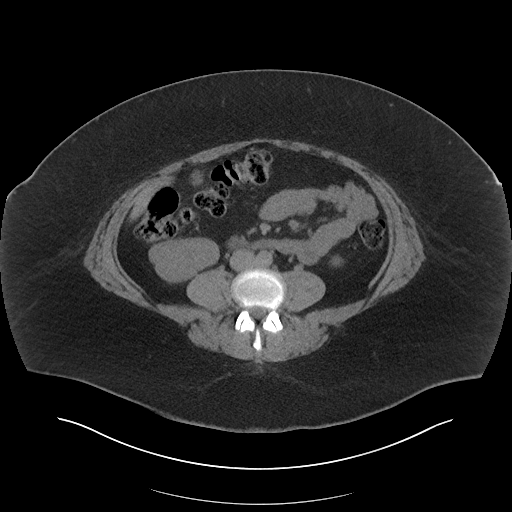
[im 60/94  soft-tissue]
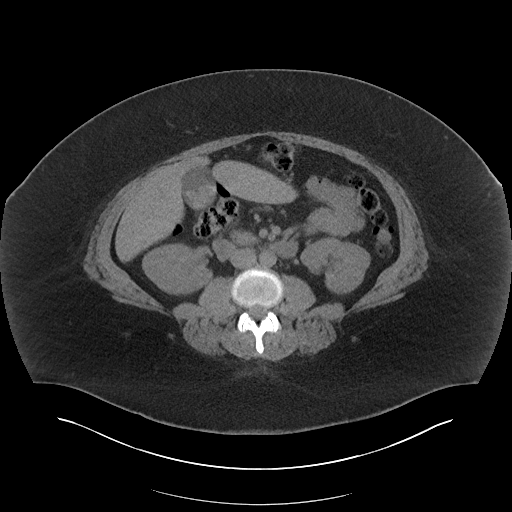
[im 60/94  bone]
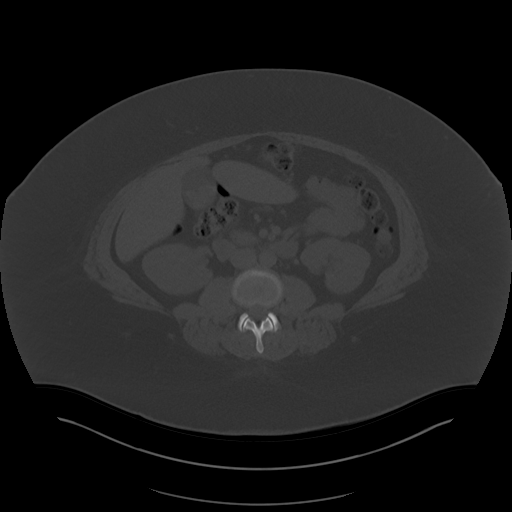
[im 67/94  soft-tissue]
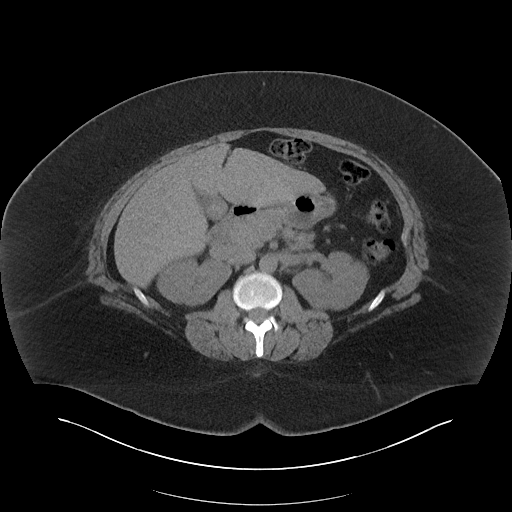
[im 75/94  soft-tissue]
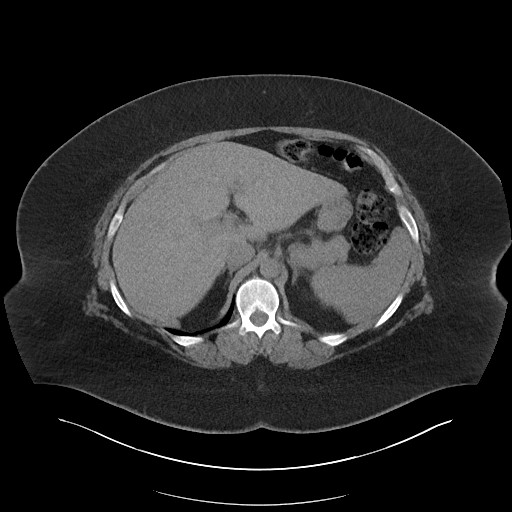
[im 82/94  soft-tissue]
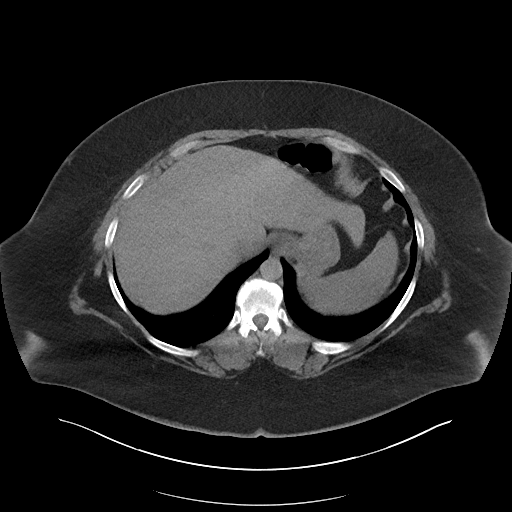
[im 90/94  soft-tissue]
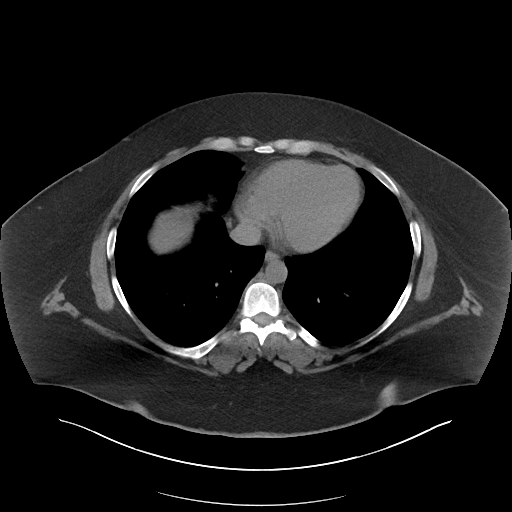

[Series 5: coronal · coronal · 0.91mm/px · 3 of 121 slices shown]
[im 41/121  soft-tissue]
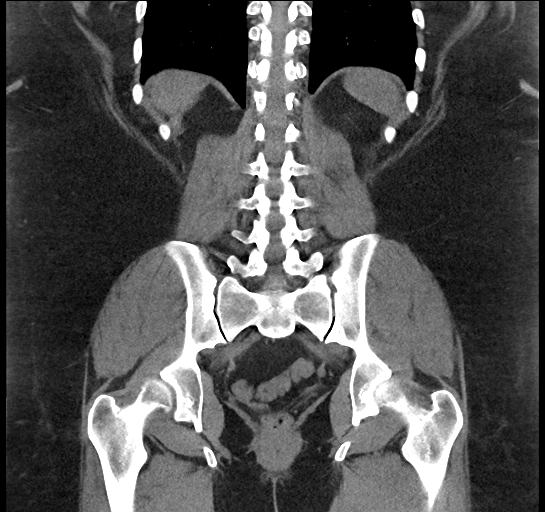
[im 54/121  soft-tissue]
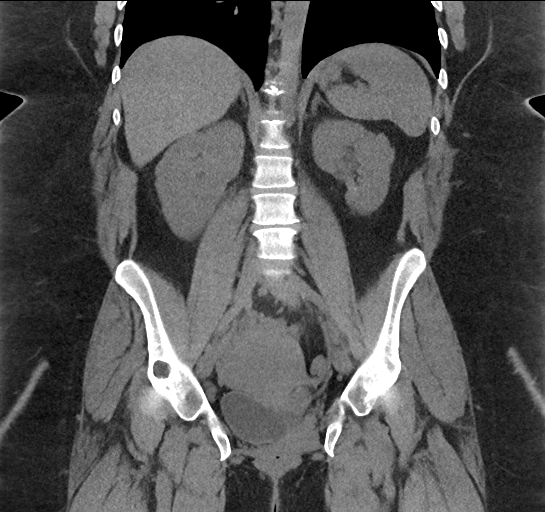
[im 67/121  soft-tissue]
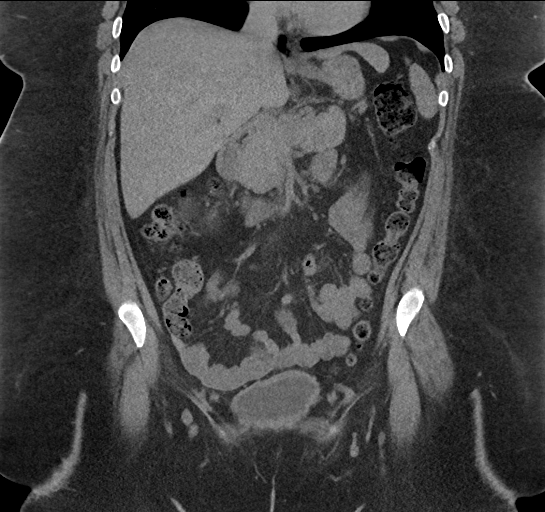

[16 of 46 positions shown; findings below may reference images not displayed]

FINDINGS: Lower chest: Lung bases are clear. Heart size normal. No pericardial
or pleural effusion. Distal esophagus is unremarkable.

Hepatobiliary: Liver is enlarged, 18.7 cm. Stones in the
gallbladder. No biliary ductal dilatation.

Pancreas: Negative.

Spleen: Negative.

Adrenals/Urinary Tract: Adrenal glands and right kidney are
unremarkable. Scarring in the left kidney. Punctate stone in the
upper pole left kidney. Ureters are decompressed. Bladder is grossly
unremarkable.

Stomach/Bowel: Stomach, small bowel, appendix and colon are
unremarkable.

Vascular/Lymphatic: Vascular structures are unremarkable. No
pathologically enlarged lymph nodes.

Reproductive: Uterus is visualized.  No adnexal mass.

Other: No free fluid.  Mesenteries and peritoneum are unremarkable.

Musculoskeletal: Well-circumscribed lucency in the right acetabulum
appears benign and may be degenerative in etiology. Degenerative
changes the lower thoracic spine. Right femoral head is slightly
laterally subluxed with collar osteophytosis. No worrisome lytic or
sclerotic lesions.
IMPRESSION: 1. No acute findings.
2. Hepatomegaly.
3. Punctate left renal stone with left renal scarring.
4. Cholelithiasis.
5. Slightly laterally subluxed right femoral head with associated
collar osteophytosis.

## 2021-08-17 ENCOUNTER — Ambulatory Visit (INDEPENDENT_AMBULATORY_CARE_PROVIDER_SITE_OTHER): Payer: Medicaid Other | Admitting: Urology

## 2021-08-17 ENCOUNTER — Encounter: Payer: Self-pay | Admitting: Urology

## 2021-08-17 VITALS — BP 135/87 | HR 93 | Ht 66.0 in | Wt 302.0 lb

## 2021-08-17 DIAGNOSIS — Z87440 Personal history of urinary (tract) infections: Secondary | ICD-10-CM

## 2021-08-17 DIAGNOSIS — N200 Calculus of kidney: Secondary | ICD-10-CM | POA: Diagnosis not present

## 2021-08-17 DIAGNOSIS — N390 Urinary tract infection, site not specified: Secondary | ICD-10-CM

## 2021-08-17 LAB — URINALYSIS, ROUTINE W REFLEX MICROSCOPIC
Bilirubin, UA: NEGATIVE
Glucose, UA: NEGATIVE
Ketones, UA: NEGATIVE
Nitrite, UA: NEGATIVE
Protein,UA: NEGATIVE
Specific Gravity, UA: 1.025 (ref 1.005–1.030)
Urobilinogen, Ur: 0.2 mg/dL (ref 0.2–1.0)
pH, UA: 6.5 (ref 5.0–7.5)

## 2021-08-17 LAB — MICROSCOPIC EXAMINATION
Bacteria, UA: NONE SEEN
RBC, Urine: >30 /hpf — AB (ref 0–2)
Renal Epithel, UA: NONE SEEN /hpf

## 2021-08-17 MED ORDER — CIPROFLOXACIN HCL 500 MG PO TABS: 500.0000 mg | ORAL_TABLET | Freq: Once | ORAL | Status: DC

## 2021-08-17 NOTE — Progress Notes (Signed)
? ?Assessment: ?1. Frequent UTI   ?2. Nephrolithiasis   ? ? ?Plan: ?Continue methods to reduce the risk of UTIs discussed with the patient including increased fluid intake, timed and double voiding, daily cranberry supplement, and daily probiotic. ?I personally reviewed the CT study from 08/14/2021 with findings as noted below. ?Stone prevention discussed with the patient. ?She does not wish to proceed with cystoscopy at this time. ?I again discussed the importance of obtaining a urine culture prior to any treatment for UTI symptoms. ?Return to office in 2 months. ? ?Chief Complaint:  ?Chief Complaint  ?Patient presents with  ? Recurrent UTI  ? ? ?History of Present Illness: ? ?[name] is a 36 y.o. year old female who is seen for further evaluation of recurrent UTI's.  She reports a history of UTIs approximately every 3 months for the past several years.  Typical UTI symptoms include frequency, dysuria, bladder pressure.  Her symptoms typically resolve when she is treated with antibiotics.  She was last treated with Macrodantin in March 2023.  She was not having any UTI symptoms at the time of her visit on 07/08/2021.  She does have baseline frequency, some urgency, and nocturia 2-3 times.  She reports occasional incontinence associated with her UTIs.  No stress incontinence.  She does have a history of kidney stones.  She underwent bladder reconstruction at at age 57-2 years for apparent bladder exstrophy.  This was done at [facility].  No records available. ? ?Urine culture results: ?11/22 No growth ?3/23 No growth ?3/23 No growth on Mdx culture ? ?CT abdomen and pelvis without contrast from 08/14/2021 showed a punctate stone in the upper pole of the left kidney, no evidence of renal mass or obstruction and a grossly unremarkable bladder. ? ?She returns today for follow-up.  She has not had any recent UTI symptoms.  No dysuria or gross hematuria.  She is starting her menstrual cycle. ? ?Portions of the above  documentation were copied from a prior visit for review purposes only. ? ?Past Medical History:  ?Past Medical History:  ?Diagnosis Date  ? Arthritis   ? Hypertension   ? Kidney infection   ? UTI (urinary tract infection)   ? Vaginal Pap smear, abnormal   ? ? ?Past Surgical History:  ?Past Surgical History:  ?Procedure Laterality Date  ? BLADDER SURGERY    ? 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  ? CESAREAN SECTION    ? COLPOSCOPY    ? ? ?Allergies:  ?Allergies  ?Allergen Reactions  ? Bee Venom Swelling  ? ? ?Family History:  ?Family History  ?Problem Relation Age of Onset  ? Cancer Other   ? Diabetes Other   ? Thyroid disease Mother   ? Hypertension Mother   ? Heart attack Mother   ? Cancer Maternal Grandmother   ? Asthma Maternal Grandmother   ? Arthritis Maternal Grandmother   ? Cancer Maternal Grandfather   ? Diabetes Maternal Grandfather   ? Thyroid disease Maternal Aunt   ? ? ?Social History:  ?Social History  ? ?Tobacco Use  ? Smoking status: Former  ?  Packs/day: 0.25  ?  Years: 2.00  ?  Pack years: 0.50  ?  Types: Cigarettes  ?  Quit date: 12/09/2020  ?  Years since quitting: 0.6  ? Smokeless tobacco: Never  ? Tobacco comments:  ?  "2-3 cigs per day"  ?Vaping Use  ? Vaping Use: Never used  ?Substance Use Topics  ? Alcohol  use: No  ? Drug use: No  ? ? ?ROS: ?Constitutional:  Negative for fever, chills, weight loss ?CV: Negative for chest pain, previous MI, hypertension ?Respiratory:  Negative for shortness of breath, wheezing, sleep apnea, frequent cough ?GI:  Negative for nausea, vomiting, bloody stool, GERD ? ?Physical exam: ?BP 135/87   Pulse 93   Ht 5\' 6"  (1.676 m)   Wt (!) 302 lb (137 kg)   BMI 48.74 kg/m?  ?GENERAL APPEARANCE:  Well appearing, well developed, well nourished, NAD ?HEENT:  Atraumatic, normocephalic, oropharynx clear ?NECK:  Supple without lymphadenopathy or thyromegaly ?ABDOMEN:  Soft, non-tender, no masses ?EXTREMITIES:  Moves all extremities well, without clubbing, cyanosis, or  edema ?NEUROLOGIC:  Alert and oriented x 3, normal gait, CN II-XII grossly intact ?MENTAL STATUS:  appropriate ?BACK:  Non-tender to palpation, No CVAT ?SKIN:  Warm, dry, and intact ? ?Results: ?U/A: 6-10 WBCs, >30 RBCs ? ?

## 2021-08-18 DIAGNOSIS — M21961 Unspecified acquired deformity of right lower leg: Secondary | ICD-10-CM | POA: Diagnosis not present

## 2021-08-18 DIAGNOSIS — M21062 Valgus deformity, not elsewhere classified, left knee: Secondary | ICD-10-CM | POA: Diagnosis not present

## 2021-08-18 DIAGNOSIS — M21061 Valgus deformity, not elsewhere classified, right knee: Secondary | ICD-10-CM | POA: Diagnosis not present

## 2021-08-18 DIAGNOSIS — M21962 Unspecified acquired deformity of left lower leg: Secondary | ICD-10-CM | POA: Diagnosis not present

## 2021-08-19 ENCOUNTER — Telehealth: Payer: Self-pay | Admitting: Orthopaedic Surgery

## 2021-08-20 MED ORDER — CYCLOBENZAPRINE HCL 10 MG PO TABS: 10.0000 mg | ORAL_TABLET | Freq: Every day | ORAL | 1 refills | Status: DC

## 2021-08-24 ENCOUNTER — Telehealth: Payer: Self-pay | Admitting: Orthopaedic Surgery

## 2021-08-24 DIAGNOSIS — M21969 Unspecified acquired deformity of unspecified lower leg: Secondary | ICD-10-CM | POA: Insufficient documentation

## 2021-08-24 DIAGNOSIS — M2180 Other specified acquired deformities of unspecified limb: Secondary | ICD-10-CM | POA: Insufficient documentation

## 2021-08-28 DIAGNOSIS — M21061 Valgus deformity, not elsewhere classified, right knee: Secondary | ICD-10-CM | POA: Diagnosis not present

## 2021-08-28 DIAGNOSIS — M21961 Unspecified acquired deformity of right lower leg: Secondary | ICD-10-CM | POA: Diagnosis not present

## 2021-08-28 DIAGNOSIS — Z01818 Encounter for other preprocedural examination: Secondary | ICD-10-CM | POA: Diagnosis not present

## 2021-08-28 DIAGNOSIS — M21062 Valgus deformity, not elsewhere classified, left knee: Secondary | ICD-10-CM | POA: Diagnosis not present

## 2021-08-28 DIAGNOSIS — M21962 Unspecified acquired deformity of left lower leg: Secondary | ICD-10-CM | POA: Diagnosis not present

## 2021-09-07 ENCOUNTER — Ambulatory Visit (HOSPITAL_COMMUNITY)
Admission: RE | Admit: 2021-09-07 | Discharge: 2021-09-07 | Disposition: A | Discharge status: Home / Self Care | Payer: Medicaid Other | Source: Ambulatory Visit | Attending: Orthopaedic Surgery | Admitting: Orthopaedic Surgery

## 2021-09-07 DIAGNOSIS — M25551 Pain in right hip: Secondary | ICD-10-CM | POA: Diagnosis not present

## 2021-09-07 DIAGNOSIS — Z6841 Body Mass Index (BMI) 40.0 and over, adult: Secondary | ICD-10-CM | POA: Insufficient documentation

## 2021-09-07 DIAGNOSIS — S73004A Unspecified dislocation of right hip, initial encounter: Secondary | ICD-10-CM | POA: Diagnosis not present

## 2021-09-07 DIAGNOSIS — M7061 Trochanteric bursitis, right hip: Secondary | ICD-10-CM | POA: Diagnosis not present

## 2021-09-07 DIAGNOSIS — Q6589 Other specified congenital deformities of hip: Secondary | ICD-10-CM | POA: Diagnosis not present

## 2021-09-07 DIAGNOSIS — G8929 Other chronic pain: Secondary | ICD-10-CM | POA: Diagnosis not present

## 2021-09-07 DIAGNOSIS — M1611 Unilateral primary osteoarthritis, right hip: Secondary | ICD-10-CM | POA: Diagnosis not present

## 2021-09-07 DIAGNOSIS — S73001A Unspecified subluxation of right hip, initial encounter: Secondary | ICD-10-CM | POA: Diagnosis not present

## 2021-09-07 IMAGING — MR MR HIP*R* W/O CM
5 series · 40 of 40 positions shown · non-contrast
Comparison: X-ray [DATE], CT [DATE]

CLINICAL DATA: Right hip pain for 2 years. History of bladder
exstrophy

EXAM:
MR OF THE RIGHT HIP WITHOUT CONTRAST
TECHNIQUE: Multiplanar, multisequence MR imaging was performed. No intravenous
contrast was administered.

[Series 4: T1 · coronal · right · 4.0mm · 1.05mm/px · 9 of 33 slices shown]
[im 1/33]
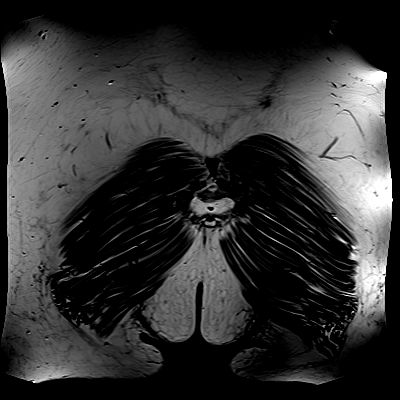
[im 5/33]
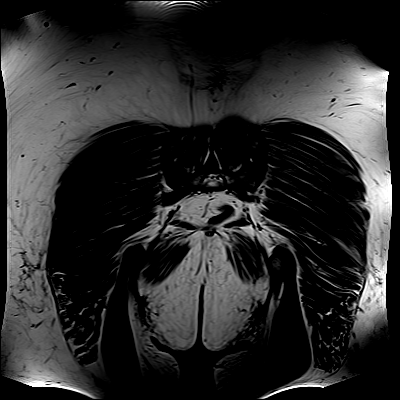
[im 9/33]
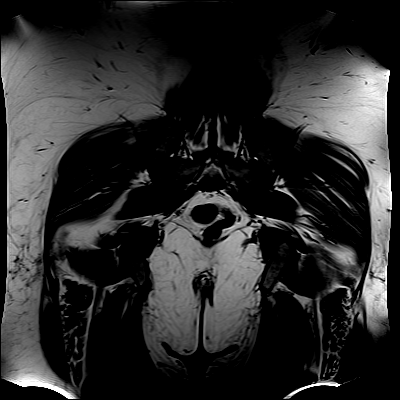
[im 13/33]
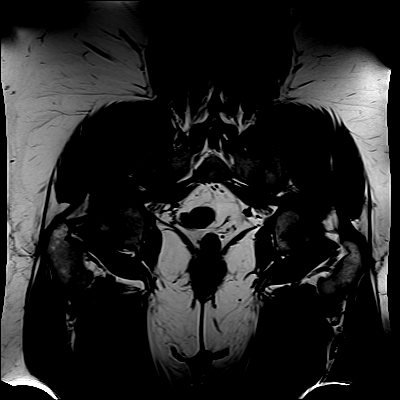
[im 17/33]
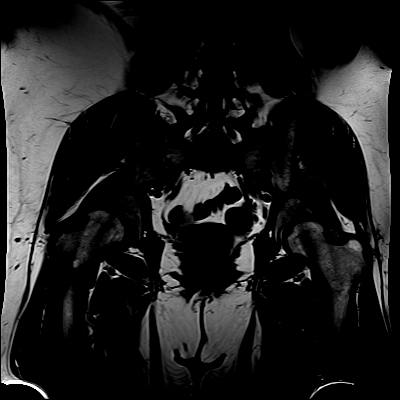
[im 21/33]
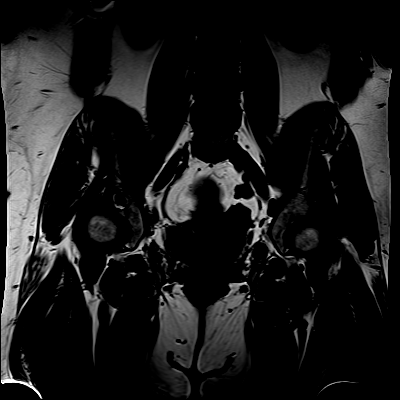
[im 25/33]
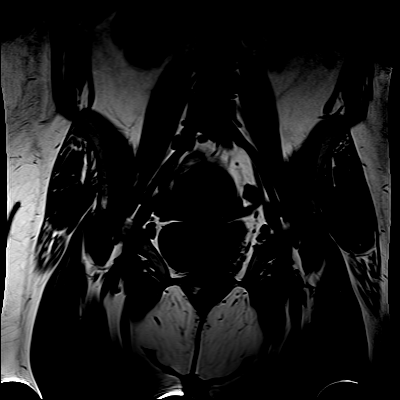
[im 29/33]
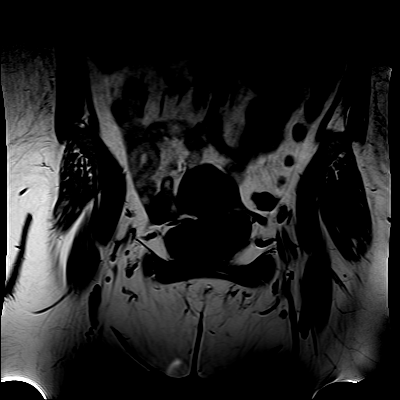
[im 33/33]
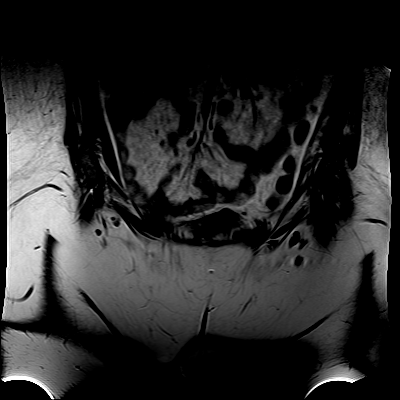

[Series 5: STIR · coronal · right · 4.0mm · 1.04mm/px · 9 of 33 slices shown]
[im 1/33]
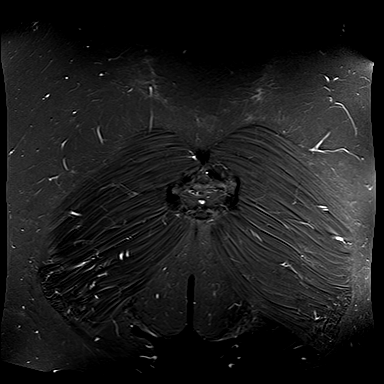
[im 5/33]
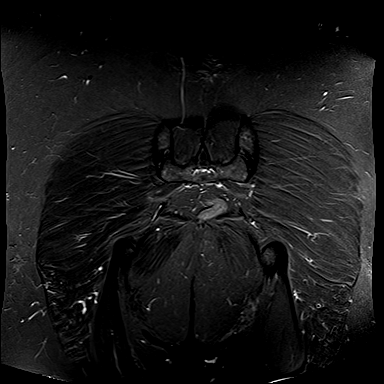
[im 9/33]
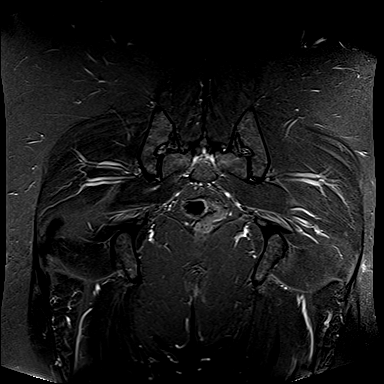
[im 13/33]
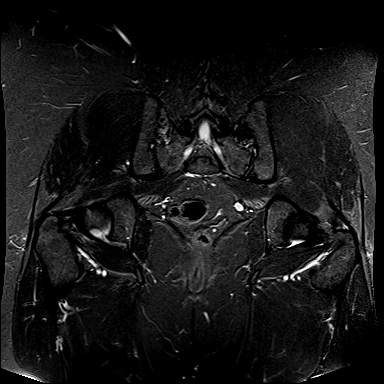
[im 17/33]
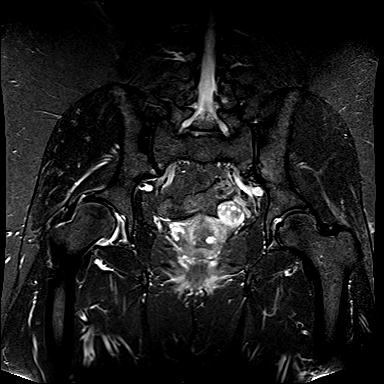
[im 21/33]
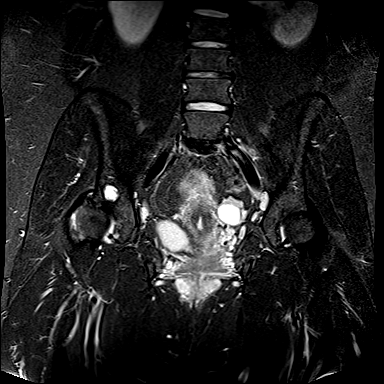
[im 25/33]
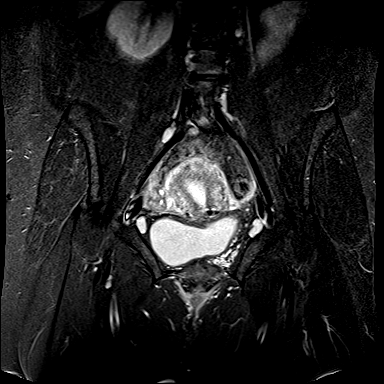
[im 29/33]
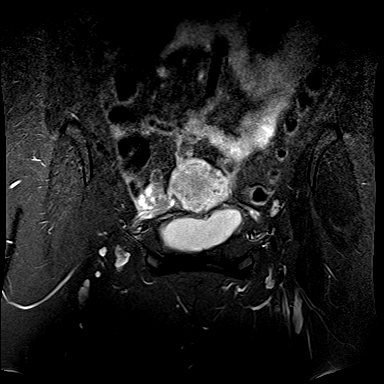
[im 33/33]
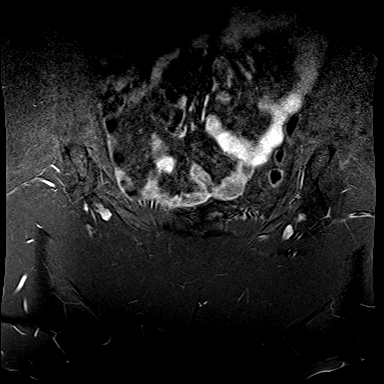

[Series 6: T2 fat-sat · axial · right · 4.0mm · 1.79mm/px · z∈[+21,+171]mm · 9 of 31 slices shown]
[im 1/31]
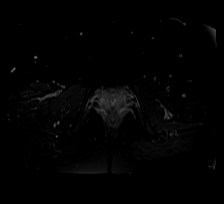
[im 4/31]
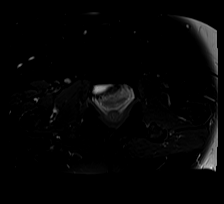
[im 8/31]
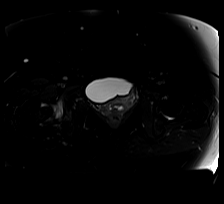
[im 12/31]
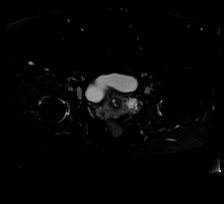
[im 16/31]
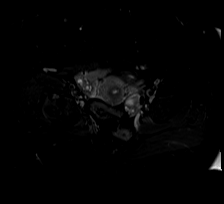
[im 19/31]
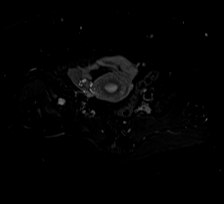
[im 23/31]
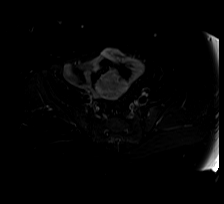
[im 27/31]
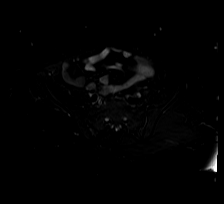
[im 31/31]
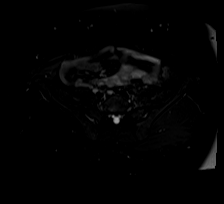

[Series 8: PD fat-sat · sagittal · right · 4.0mm · 0.70mm/px · 7 of 25 slices shown (1 of 2)]
[im 1/25]
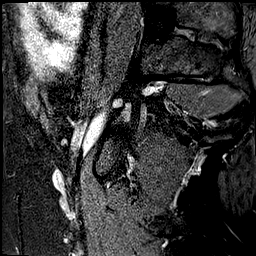
[im 5/25]
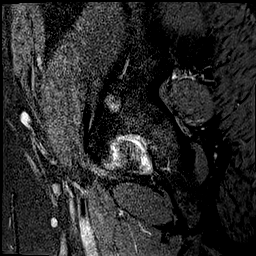
[im 9/25]
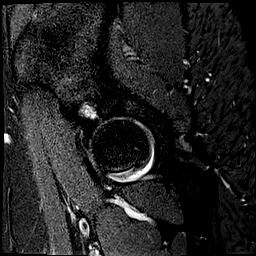
[im 13/25]
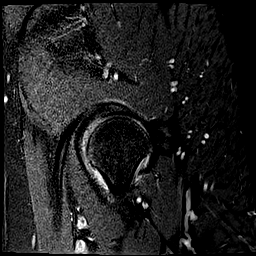
[im 17/25]
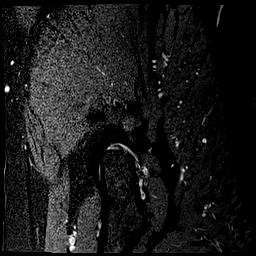
[im 21/25]
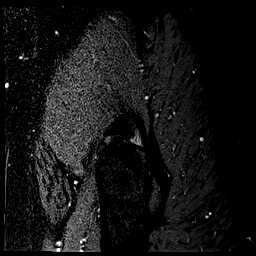
[im 25/25]
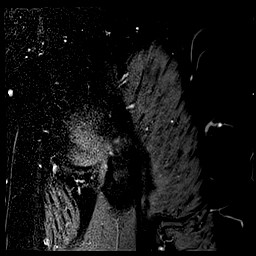

[Series 9: PD fat-sat · coronal · right · 4.0mm · 0.78mm/px · 6 of 20 slices shown (2 of 2)]
[im 1/20]
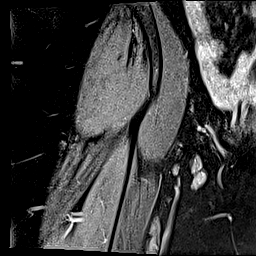
[im 4/20]
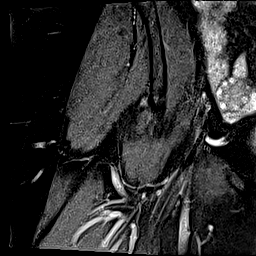
[im 8/20]
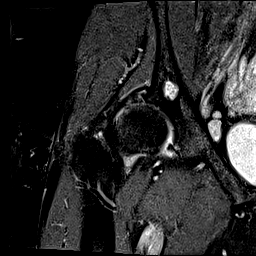
[im 12/20]
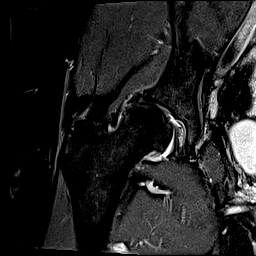
[im 16/20]
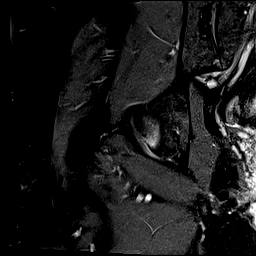
[im 20/20]
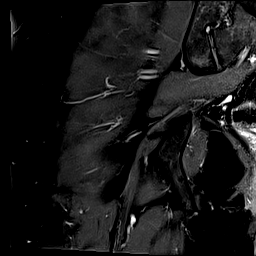

[40 of 40 positions shown; findings below may reference images not displayed]

FINDINGS: Bones: No acute fracture. No dislocation. Abnormally widened pubic
symphysis measuring greater than 5 cm in diameter compatible with
history of congenital bladder exstrophy. No femoral head avascular
necrosis. SI joints intact without diastasis. No significant
arthropathy of the left hip. SI joints and pubic symphysis within
normal limits. No bone marrow edema. No marrow replacing bone
lesion.

Articular cartilage and labrum

Articular cartilage: Shallow right acetabulum with high-grade
cartilage loss of the superior femoral head and superior acetabulum.
Large subchondral cysts within the superior and anterosuperior
acetabulum. Small femoral head marginal osteophytes. Femoral head is
laterally subluxed relative to the acetabulum.

Labrum: Complex tearing of the superior to anterosuperior labrum. No
paralabral cyst.

Joint or bursal effusion

Joint effusion:  Small right hip joint effusion.

Bursae: No abnormal bursal fluid collection.

Muscles and tendons

Muscles and tendons: Mild tendinosis of the bilateral gluteus medius
tendons. The gluteus minimus, hamstring, iliopsoas, rectus femoris,
and adductor tendons appear intact without tear or significant
tendinosis. Normal muscle bulk and signal intensity without edema,
atrophy, or fatty infiltration.

Other findings

Miscellaneous: No soft tissue edema or fluid collection. No inguinal
lymphadenopathy.
IMPRESSION: 1. Congenital right hip dysplasia with moderate-severe
osteoarthritis of the right hip. Femoral head is laterally subluxed
without dislocation. Small right hip joint effusion.
2. Complex tearing of the superior to anterosuperior labrum.
3. Mild tendinosis of the bilateral gluteus medius tendons.
4. Abnormally widened pubic symphysis compatible with history of
congenital bladder exstrophy.

## 2021-09-09 ENCOUNTER — Other Ambulatory Visit: Payer: Self-pay | Admitting: "Endocrinology

## 2021-09-11 ENCOUNTER — Ambulatory Visit: Payer: Medicaid Other | Admitting: Orthopedic Surgery

## 2021-09-11 ENCOUNTER — Encounter: Payer: Self-pay | Admitting: Orthopedic Surgery

## 2021-09-11 VITALS — Ht 66.0 in | Wt 302.0 lb

## 2021-09-11 DIAGNOSIS — Q6589 Other specified congenital deformities of hip: Secondary | ICD-10-CM

## 2021-09-11 NOTE — Patient Instructions (Signed)
I will send your referral to [facility] you can call them to schedule the number is [phone]. They will need you to get a CD of the xray images from [facility]. The number to call for the CD is [phone] ask for [facility]   

## 2021-09-11 NOTE — Progress Notes (Signed)
FOLLOW UP   Encounter Diagnosis  Name Primary?   Congenital dysplasia of right hip Yes     Chief Complaint  Patient presents with   Results    Review MRI right hip      This 36 year old female presents for results of her MRI right hip  She tells me she has had some right hip pain for some time thought to initially be related to lumbar disc pathology but eventually symptoms prove themselves to be more hip related  The MRI shows congenital hip dysplasia.  I have reviewed the report  IMPRESSION: 1. 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 Femoral head is laterally subluxed without dislocation. Small right hip joint effusion. 2. Complex tearing of the superior to anterosuperior labrum. 3. Mild tendinosis of the bilateral gluteus medius tendons. 4. 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     Electronically Signed  As can be seen in the impression she has hip dysplasia with severe arthritis of the right hip and lateral subluxation of the femoral head there is also a labral tear.  There are some other issues.  There is some pelvic deformity that she reports as well and this is seen with a widened pubic symphysis, requiring surgery for her bladder exstrophy  As this is outside of the scope of the practice of Dr. Hilda Lias or myself I have referred the patient back to [facility] where she received some of her care.  Treatment options would include but are not limited to  Pelvic osteotomy although this is probably unlikely due to the severity of the arthritis  Hip dislocation with labral repair and debridement also unlikely  Right total hip even at age 60.  However there may be some issues with her BMI

## 2021-09-24 ENCOUNTER — Other Ambulatory Visit: Payer: Self-pay

## 2021-09-24 DIAGNOSIS — Q6589 Other specified congenital deformities of hip: Secondary | ICD-10-CM

## 2021-09-24 DIAGNOSIS — M7061 Trochanteric bursitis, right hip: Secondary | ICD-10-CM

## 2021-09-24 NOTE — Progress Notes (Signed)
MRI was reviewed by Dr. Hilda Lias, told to send referral to [facility] for further evaluation/treatment.

## 2021-09-27 ENCOUNTER — Telehealth: Payer: Self-pay | Admitting: Orthopaedic Surgery

## 2021-09-29 MED ORDER — HYDROCODONE-ACETAMINOPHEN 5-325 MG PO TABS
ORAL_TABLET | ORAL | 0 refills | Status: DC
Start: 2021-09-29 — End: 2021-12-22

## 2021-10-08 DIAGNOSIS — M21961 Unspecified acquired deformity of right lower leg: Secondary | ICD-10-CM | POA: Diagnosis not present

## 2021-10-08 DIAGNOSIS — M21962 Unspecified acquired deformity of left lower leg: Secondary | ICD-10-CM | POA: Diagnosis not present

## 2021-10-09 DIAGNOSIS — M21861 Other specified acquired deformities of right lower leg: Secondary | ICD-10-CM | POA: Diagnosis not present

## 2021-10-09 DIAGNOSIS — E119 Type 2 diabetes mellitus without complications: Secondary | ICD-10-CM | POA: Diagnosis not present

## 2021-10-09 DIAGNOSIS — I10 Essential (primary) hypertension: Secondary | ICD-10-CM | POA: Diagnosis not present

## 2021-10-09 DIAGNOSIS — M84662A Pathological fracture in other disease, left tibia, initial encounter for fracture: Secondary | ICD-10-CM | POA: Diagnosis not present

## 2021-10-09 DIAGNOSIS — Z87891 Personal history of nicotine dependence: Secondary | ICD-10-CM | POA: Diagnosis not present

## 2021-10-09 DIAGNOSIS — D62 Acute posthemorrhagic anemia: Secondary | ICD-10-CM | POA: Diagnosis not present

## 2021-10-09 DIAGNOSIS — Z6841 Body Mass Index (BMI) 40.0 and over, adult: Secondary | ICD-10-CM | POA: Diagnosis not present

## 2021-10-09 DIAGNOSIS — E669 Obesity, unspecified: Secondary | ICD-10-CM | POA: Diagnosis not present

## 2021-10-09 DIAGNOSIS — M62838 Other muscle spasm: Secondary | ICD-10-CM | POA: Diagnosis not present

## 2021-10-09 DIAGNOSIS — M21862 Other specified acquired deformities of left lower leg: Secondary | ICD-10-CM | POA: Diagnosis not present

## 2021-10-12 ENCOUNTER — Ambulatory Visit: Payer: Medicaid Other | Admitting: Urology

## 2021-10-12 NOTE — Progress Notes (Deleted)
Assessment: 1. Frequent UTI   2. Nephrolithiasis   3. History of exstrophy of bladder      Plan: Continue methods to reduce the risk of UTIs discussed with the patient including increased fluid intake, timed and double voiding, daily cranberry supplement, and daily probiotic. Stone prevention discussed with the patient. I again discussed the importance of obtaining a urine culture prior to any treatment for UTI symptoms. Return to office in 2 months.  Chief Complaint:  No chief complaint on file.   History of Present Illness:  [name] is a 36 y.o. year old female who is seen for further evaluation of recurrent UTI's.  She reports a history of UTIs approximately every 3 months for the past several years.  Typical UTI symptoms include frequency, dysuria, bladder pressure.  Her symptoms typically resolve when she is treated with antibiotics.  She was last treated with Macrodantin in March 2023.  She was not having any UTI symptoms at the time of her visit on 07/08/2021.  She does have baseline frequency, some urgency, and nocturia 2-3 times.  She reports occasional incontinence associated with her UTIs.  No stress incontinence.  She does have a history of kidney stones.  She underwent bladder reconstruction at at age 35-2 years for apparent bladder exstrophy.  This was done at [facility].  No records available.  Urine culture results: 11/22 No growth 3/23 No growth 3/23 No growth on Mdx culture  CT abdomen and pelvis without contrast from 08/14/2021 showed a punctate stone in the upper pole of the left kidney, no evidence of renal mass or obstruction and a grossly unremarkable bladder.  She returns today for follow-up.  She has not had any recent UTI symptoms.  No dysuria or gross hematuria.  She is starting her menstrual cycle.  Portions of the above documentation were copied from a prior visit for review purposes only.  Past Medical History:  Past Medical History:  Diagnosis Date    Arthritis    Hypertension    Kidney infection    UTI (urinary tract infection)    Vaginal Pap smear, abnormal     Past Surgical History:  Past Surgical History:  Procedure Laterality Date   BLADDER SURGERY     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   CESAREAN SECTION     COLPOSCOPY      Allergies:  Allergies  Allergen Reactions   Bee Venom Swelling    Family History:  Family History  Problem Relation Age of Onset   Cancer Other    Diabetes Other    Thyroid disease Mother    Hypertension Mother    Heart attack Mother    Cancer Maternal Grandmother    Asthma Maternal Grandmother    Arthritis Maternal Grandmother    Cancer Maternal Grandfather    Diabetes Maternal Grandfather    Thyroid disease Maternal Aunt     Social History:  Social History   Tobacco Use   Smoking status: Former    Packs/day: 0.25    Years: 2.00    Total pack years: 0.50    Types: Cigarettes    Quit date: 12/09/2020    Years since quitting: 0.8   Smokeless tobacco: Never   Tobacco comments:    "2-3 cigs per day"  Vaping Use   Vaping Use: Never used  Substance Use Topics   Alcohol use: No   Drug use: No    ROS: Constitutional:  Negative for fever, chills, weight loss CV:  Negative for chest pain, previous MI, hypertension Respiratory:  Negative for shortness of breath, wheezing, sleep apnea, frequent cough GI:  Negative for nausea, vomiting, bloody stool, GERD  Physical exam: There were no vitals taken for this visit. GENERAL APPEARANCE:  Well appearing, well developed, well nourished, NAD HEENT:  Atraumatic, normocephalic, oropharynx clear NECK:  Supple without lymphadenopathy or thyromegaly ABDOMEN:  Soft, non-tender, no masses EXTREMITIES:  Moves all extremities well, without clubbing, cyanosis, or edema NEUROLOGIC:  Alert and oriented x 3, normal gait, CN II-XII grossly intact MENTAL STATUS:  appropriate BACK:  Non-tender to palpation, No CVAT SKIN:  Warm, dry, and  intact  Results: U/A:

## 2021-11-22 ENCOUNTER — Other Ambulatory Visit: Payer: Self-pay | Admitting: "Endocrinology

## 2021-11-30 ENCOUNTER — Other Ambulatory Visit: Payer: Self-pay | Admitting: Orthopaedic Surgery

## 2021-12-01 DIAGNOSIS — Z4789 Encounter for other orthopedic aftercare: Secondary | ICD-10-CM | POA: Diagnosis not present

## 2021-12-01 DIAGNOSIS — M21962 Unspecified acquired deformity of left lower leg: Secondary | ICD-10-CM | POA: Diagnosis not present

## 2021-12-14 ENCOUNTER — Ambulatory Visit: Payer: Medicaid Other | Admitting: "Endocrinology

## 2021-12-15 DIAGNOSIS — D2239 Melanocytic nevi of other parts of face: Secondary | ICD-10-CM | POA: Diagnosis not present

## 2021-12-22 ENCOUNTER — Ambulatory Visit: Payer: Medicaid Other | Admitting: Orthopaedic Surgery

## 2021-12-22 ENCOUNTER — Encounter: Payer: Self-pay | Admitting: Orthopaedic Surgery

## 2021-12-22 VITALS — Ht 66.0 in | Wt 302.0 lb

## 2021-12-22 DIAGNOSIS — G8929 Other chronic pain: Secondary | ICD-10-CM | POA: Diagnosis not present

## 2021-12-22 DIAGNOSIS — M25561 Pain in right knee: Secondary | ICD-10-CM

## 2021-12-22 MED ORDER — METHYLPREDNISOLONE ACETATE 40 MG/ML IJ SUSP
40.0000 mg | Freq: Once | INTRAMUSCULAR | Status: AC
  Administered 2021-12-22: 40 mg via INTRA_ARTICULAR

## 2021-12-22 MED ORDER — CYCLOBENZAPRINE HCL 10 MG PO TABS: 10.0000 mg | ORAL_TABLET | Freq: Every day | ORAL | 1 refills | Status: DC

## 2021-12-22 MED ORDER — HYDROCODONE-ACETAMINOPHEN 5-325 MG PO TABS: ORAL_TABLET | ORAL | 0 refills | Status: DC

## 2021-12-22 NOTE — Addendum Note (Signed)
Addended by: Recardo Evangelist A on: 12/22/2021 11:21 AM   Modules accepted: Orders

## 2021-12-22 NOTE — Progress Notes (Signed)
My right knee hurts.  She had recent left lower leg tibial osteotomy at [facility] in June.  She has done well from that.  She has chronic pain of the right knee.  She has some giving way at times, effusion.  PROCEDURE NOTE:  The patient requests injections of the right knee , verbal consent was obtained.  The right knee was prepped appropriately after time out was performed.   Sterile technique was observed and injection of 1 cc of DepoMedrol 40mg  with several cc's of plain xylocaine. Anesthesia was provided by ethyl chloride and a 20-gauge needle was used to inject the knee area. The injection was tolerated well.  A band aid dressing was applied.  The patient was advised to apply ice later today and tomorrow to the injection sight as needed.  Encounter Diagnosis  Name Primary?   Chronic pain of right knee Yes   I have reviewed the Controlled Substance Reporting System web site prior to prescribing narcotic medicine for this patient.  Return in six weeks.  Call if any problem.  Precautions discussed.  Electronically Signed [place], MD 9/5/20239:20 AM

## 2022-01-13 ENCOUNTER — Encounter (HOSPITAL_COMMUNITY): Payer: Self-pay | Admitting: Physical Therapy

## 2022-01-13 ENCOUNTER — Ambulatory Visit (HOSPITAL_COMMUNITY): Payer: Medicaid Other | Attending: General Practice | Admitting: Physical Therapy

## 2022-01-13 DIAGNOSIS — M6281 Muscle weakness (generalized): Secondary | ICD-10-CM | POA: Insufficient documentation

## 2022-01-13 DIAGNOSIS — R2689 Other abnormalities of gait and mobility: Secondary | ICD-10-CM | POA: Insufficient documentation

## 2022-01-13 DIAGNOSIS — R262 Difficulty in walking, not elsewhere classified: Secondary | ICD-10-CM | POA: Insufficient documentation

## 2022-01-13 DIAGNOSIS — M79605 Pain in left leg: Secondary | ICD-10-CM | POA: Diagnosis not present

## 2022-01-13 NOTE — Therapy (Signed)
OUTPATIENT PHYSICAL THERAPY LOWER EXTREMITY EVALUATION   Patient Name: [name] MRN: [id number] DOB:1985/08/26, 36 y.o., female Today's Date: 01/13/2022   PT End of Session - 01/13/22 1347     Visit Number 1    Number of Visits 12    Date for PT Re-Evaluation 02/24/22    Authorization Type Medicaid [employer]    Authorization Time Period 12 visits requested - check auth    PT Start Time 1347    PT Stop Time 1426    PT Time Calculation (min) 39 min    Activity Tolerance Patient tolerated treatment well    Behavior During Therapy WFL for tasks assessed/performed             Past Medical History:  Diagnosis Date   Arthritis    Hypertension    Kidney infection    UTI (urinary tract infection)    Vaginal Pap smear, abnormal    Past Surgical History:  Procedure Laterality Date   BLADDER SURGERY     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   CESAREAN SECTION     COLPOSCOPY     Patient Active Problem List   Diagnosis Date Noted   Acquired deformity of parts of limb 08/24/2021   DM (diabetes mellitus) (HCC) 08/11/2021   HTN (hypertension) 08/11/2021   Osteoarthritis 08/11/2021   Frequent UTI 07/08/2021   History of exstrophy of bladder 07/08/2021   Incomplete bladder emptying 07/08/2021   Nephrolithiasis 07/08/2021   Prediabetes 06/15/2021   Morbid obesity (HCC) 08/25/2020   Essential hypertension, benign 11/08/2019   History of cesarean delivery 08/01/2019   BMI 50.0-59.9, adult (HCC) 08/01/2019   IUFD at less than 20 weeks of gestation 07/31/2019   Marijuana use 06/21/2019   Smoker 06/20/2019   Abnormal Pap smear of cervix 06/20/2019   Depression with anxiety 06/20/2019    PCP: Benetta Spar MD  REFERRING PROVIDER: Forest Becker, MD   REFERRING DIAG: tibial deformity, acquired, 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 R leg feels weak too. She was having trouble walking before this and then continued PT exercises and it helped. She then quit due to increased pain after almost most falling. She recently began doing exercises again and they're helping. She is having trouble walking, weightbearing, standing. She doesn't have as much stamina.   PERTINENT HISTORY: s/p L IMN Proximal tibial with osteotomy dos: 10/08/2021, chronic B knee pain, R hip pain, back pain, obesity   PAIN:  Are you having pain? Yes: NPRS scale: 3-4/10 Pain location: L shin/knee Pain description: discomfort Aggravating factors: weightbearing, walking Relieving factors: rest  PRECAUTIONS: None  WEIGHT BEARING RESTRICTIONS No  FALLS:  Has patient fallen in last 6 months? No  LIVING ENVIRONMENT: Lives with: lives with their family Lives in: Mobile home Stairs: Yes: External: 6 steps; can reach both Has following equipment at home: Single point cane, Walker - 2 wheeled, Wheelchair (manual), and bed side commode  OCCUPATION: [employer]  PLOF: Independent  PATIENT GOALS improve mobility   OBJECTIVE:   PATIENT SURVEYS:  LEFS 36/80  COGNITION:  Overall cognitive status: Within functional limits for tasks assessed     SENSATION: WFL   POSTURE: rounded shoulders and forward head  PALPATION: No TTP  LOWER EXTREMITY ROM:  Active ROM Right eval Left eval  Hip flexion    Hip extension    Hip abduction    Hip adduction    Hip internal rotation    Hip external rotation    Knee flexion 115 104  Knee extension  4 hyperextension  Ankle dorsiflexion    Ankle plantarflexion    Ankle inversion    Ankle eversion     (Blank rows = not tested)  LOWER EXTREMITY MMT:  MMT Right eval Left eval  Hip flexion 4+ 4+  Hip extension 4+ 4+  Hip  abduction 3+ 4  Hip adduction    Hip internal rotation    Hip external rotation    Knee flexion 5 4  Knee extension 5 4-*  Ankle dorsiflexion 5 5  Ankle plantarflexion    Ankle inversion    Ankle eversion     (Blank rows = not tested)   FUNCTIONAL TESTS:  5 times sit to stand: 15.70 seconds 2 minute walk test: 230 feet  GAIT: Distance walked: 230 feet Assistive device utilized: None Level of assistance: Complete Independence Comments: , antalgic, limited knee flexion/extension ROM bilateral, severe valgus fatigued during/following in LEs    TODAY'S TREATMENT: 01/13/22 LAQ x 10 5 second holds Bridge x 10 - educated for HEP Hip abduction x 10  - educated for HEP   PATIENT EDUCATION:  Education details: Patient educated on exam findings, POC, scope of PT, HEP, and avoiding hyperextension. Person educated: Patient Education method: Explanation, Demonstration, and Handouts Education comprehension: verbalized understanding, returned demonstration, verbal cues required, and tactile cues required  HOME EXERCISE PROGRAM: Access Code: [email or web address]  Date: 01/13/2022 - Seated Long Arc Quad (Mirrored)  - 3 x daily - 7 x weekly - 2 sets - 10 reps - 5 second hold - Supine Bridge  - 3 x daily - 7 x weekly - 2 sets - 10 reps - Sidelying Hip Abduction (Mirrored)  - 3 x daily - 7 x weekly - 2 sets - 10 reps  ASSESSMENT:  CLINICAL IMPRESSION: Patient a 36 y.o. y.o. female who was seen today for physical therapy evaluation and treatment for s/p L IMN Proximal tibial with osteotomy dos: 10/08/2021 . Patient presents with pain limited deficits in LLE strength, ROM, endurance, activity tolerance, gait, balance, and functional mobility with ADL. Patient is having to modify and restrict ADL as indicated by outcome measure score as well as subjective information and objective measures which is affecting overall participation. Patient will benefit from skilled physical therapy in order to improve  function and reduce impairment.  OBJECTIVE IMPAIRMENTS Abnormal gait, decreased activity tolerance, decreased balance, decreased endurance, decreased mobility, difficulty walking, decreased ROM, decreased strength, increased edema, increased fascial restrictions, increased muscle spasms, impaired flexibility, improper body mechanics, obesity, and pain.   ACTIVITY LIMITATIONS carrying, lifting, bending, standing, squatting, stairs, transfers, locomotion level, and caring for others  PARTICIPATION LIMITATIONS: meal prep, cleaning, laundry, shopping, community activity, and yard work  PERSONAL FACTORS Fitness, Time since onset of injury/illness/exacerbation, and 3+ comorbidities: chronic B knee pain, R hip pain, back pain, obesity, HTN, DM  are also affecting patient's functional outcome.   REHAB POTENTIAL: Good  CLINICAL DECISION MAKING: Evolving/moderate complexity  EVALUATION COMPLEXITY: Moderate   GOALS: Goals reviewed with patient? Yes  SHORT TERM GOALS: Target date: 02/03/2022  Patient will be independent with HEP in order to improve functional outcomes. Baseline:  Goal status: INITIAL  2.  Patient will report  at least 25% improvement in symptoms for improved quality of life. Baseline:  Goal status: INITIAL    LONG TERM GOALS: Target date: 02/24/2022  Patient will report at least 75% improvement in symptoms for improved quality of life. Baseline:  Goal status: INITIAL  2.  Patient will improve LEFS score by at least 12 points in order to indicate improved tolerance to activity. Baseline: 36/80 Goal status: INITIAL  3.  Patient will be able to navigate stairs with reciprocal pattern without compensation in order to demonstrate improved LE strength. Baseline: step too using RLE Goal status: INITIAL  4.  Patient will be able to ambulate at least 300 feet in 2MWT in order to demonstrate improved tolerance to activity. Baseline: 230 feet Goal status: INITIAL  5.   Patient will improve ROM for L knee extension/flexion to 0-115 degrees to improve squatting, and other functional mobility. Baseline: 4 hyperextension to 104 Goal status: INITIAL  6.  Patient will demonstrate grade of 5/5 MMT grade in all tested musculature as evidence of improved strength to assist with stair ambulation and gait.   Baseline: see MMT Goal status: INITIAL    PLAN: PT FREQUENCY: 2x/week  PT DURATION: 6 weeks  PLANNED INTERVENTIONS: Therapeutic exercises, Therapeutic activity, Neuromuscular re-education, Balance training, Gait training, Patient/Family education, Joint manipulation, Joint mobilization, Stair training, Orthotic/Fit training, DME instructions, Aquatic Therapy, Dry Needling, Electrical stimulation, Spinal manipulation, Spinal mobilization, Cryotherapy, Moist heat, Compression bandaging, scar mobilization, Splintting, Taping, Traction, Ultrasound, Ionotophoresis 4mg /ml Dexamethasone, and Manual therapy   PLAN FOR NEXT SESSION: quad strength, functional strength, L quad motor control to avoid hyperextension, bilateral hip strength, gait and balance training   Mearl Latin, PT 01/13/2022, 2:31 PM

## 2022-01-15 ENCOUNTER — Encounter (HOSPITAL_COMMUNITY): Payer: Self-pay | Admitting: Physical Therapy

## 2022-01-15 ENCOUNTER — Ambulatory Visit (HOSPITAL_COMMUNITY): Payer: Medicaid Other | Admitting: Physical Therapy

## 2022-01-15 DIAGNOSIS — R262 Difficulty in walking, not elsewhere classified: Secondary | ICD-10-CM

## 2022-01-15 DIAGNOSIS — R2689 Other abnormalities of gait and mobility: Secondary | ICD-10-CM

## 2022-01-15 DIAGNOSIS — M79605 Pain in left leg: Secondary | ICD-10-CM | POA: Diagnosis not present

## 2022-01-15 DIAGNOSIS — M6281 Muscle weakness (generalized): Secondary | ICD-10-CM

## 2022-01-15 NOTE — Therapy (Signed)
OUTPATIENT PHYSICAL THERAPY LOWER EXTREMITY TREATMENT   Patient Name: [name] MRN: [id number] DOB:[birth year], 36 y.o., female Today's Date: 01/15/2022   PT End of Session - 01/15/22 0842     Visit Number 2    Number of Visits 12    Date for PT Re-Evaluation 02/24/22    Authorization Type Medicaid [employer]    Authorization Time Period Approved 16 visits (9/27-12/26)    Authorization - Visit Number 1    Authorization - Number of Visits 16    Progress Note Due on Visit 10    PT Start Time 0845    PT Stop Time 0930    PT Time Calculation (min) 45 min    Activity Tolerance Patient tolerated treatment well    Behavior During Therapy WFL for tasks assessed/performed             Past Medical History:  Diagnosis Date   Arthritis    Hypertension    Kidney infection    UTI (urinary tract infection)    Vaginal Pap smear, abnormal    Past Surgical History:  Procedure Laterality Date   BLADDER SURGERY     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   CESAREAN SECTION     COLPOSCOPY     Patient Active Problem List   Diagnosis Date Noted   Acquired deformity of parts of limb 08/24/2021   DM (diabetes mellitus) (HCC) 08/11/2021   HTN (hypertension) 08/11/2021   Osteoarthritis 08/11/2021   Frequent UTI 07/08/2021   History of exstrophy of bladder 07/08/2021   Incomplete bladder emptying 07/08/2021   Nephrolithiasis 07/08/2021   Prediabetes 06/15/2021   Morbid obesity (HCC) 08/25/2020   Essential hypertension, benign 11/08/2019   History of cesarean delivery 08/01/2019   BMI 50.0-59.9, adult (HCC) 08/01/2019   IUFD at less than 20 weeks of gestation 07/31/2019   Marijuana use 06/21/2019   Smoker 06/20/2019   Abnormal Pap smear of cervix 06/20/2019   Depression with anxiety 06/20/2019    PCP: Benetta Spar MD  REFERRING PROVIDER: Forest Becker, MD   REFERRING DIAG: tibial deformity, acquired, left (X73.532)   THERAPY DIAG:  Muscle weakness  (generalized)  Difficulty in walking, not elsewhere classified  Other abnormalities of gait and mobility  Rationale for Evaluation and Treatment Rehabilitation  ONSET DATE: 10/08/2021   SUBJECTIVE:   SUBJECTIVE STATEMENT: Reports she did a lot of work around the house yesterday, was pretty stiff and sore this morning. No issues with HEP.  PERTINENT HISTORY: s/p L IMN Proximal tibial with osteotomy dos: 10/08/2021, chronic B knee pain, R hip pain, back pain, obesity   PAIN:  Are you having pain? Yes: NPRS scale: 4-5/10 Pain location: L shin/knee Pain description: discomfort Aggravating factors: weightbearing, walking Relieving factors: rest  PRECAUTIONS: None  WEIGHT BEARING RESTRICTIONS No  FALLS:  Has patient fallen in last 6 months? No  LIVING ENVIRONMENT: Lives with: lives with their family Lives in: Mobile home Stairs: Yes: External: 6 steps; can reach both Has following equipment at home: Single point cane, Walker - 2 wheeled, Wheelchair (manual), and bed side commode  OCCUPATION: [employer]  PLOF: Independent  PATIENT GOALS improve mobility   OBJECTIVE:   PATIENT SURVEYS:  LEFS 36/80  COGNITION:  Overall cognitive status: Within functional limits for tasks assessed     SENSATION: WFL   POSTURE: rounded shoulders and forward head  PALPATION: No TTP  LOWER EXTREMITY ROM:  Active ROM Right eval Left eval  Hip flexion  Hip extension    Hip abduction    Hip adduction    Hip internal rotation    Hip external rotation    Knee flexion 115 104  Knee extension  4 hyperextension  Ankle dorsiflexion    Ankle plantarflexion    Ankle inversion    Ankle eversion     (Blank rows = not tested)  LOWER EXTREMITY MMT:  MMT Right eval Left eval  Hip flexion 4+ 4+  Hip extension 4+ 4+  Hip abduction 3+ 4  Hip adduction    Hip internal rotation    Hip external rotation    Knee flexion 5 4  Knee extension 5 4-*  Ankle dorsiflexion 5 5   Ankle plantarflexion    Ankle inversion    Ankle eversion     (Blank rows = not tested)   FUNCTIONAL TESTS:  5 times sit to stand: 15.70 seconds 2 minute walk test: 230 feet  GAIT: Distance walked: 230 feet Assistive device utilized: None Level of assistance: Complete Independence Comments: 2MWT, antalgic, limited knee flexion/extension ROM bilateral, severe valgus fatigued during/following in LEs    TODAY'S TREATMENT: 01/15/22 NuStep lvl 1 x5 min, warm-up, strength, ROM, while reviewing outcomes from initial visit. LAQ 2# 2x10 Standing HS curl 2# 2x10 Sidelying hip abduction 2x10 Bridge 2x10 SLR 2x10 Hooklying isometric hip abduction with ball 2x10 3" Hooklying BTB hip abduction 2x10 Mini squats with bil hand support 2x10 Semi-tandem stance fading hand support 2x30" ea Gait training with SPC, 100' demonstrating and teaching back SPC use, 2pt gait pattern.   01/13/22 LAQ x 10 5 second holds Bridge x 10 - educated for HEP Hip abduction x 10  - educated for HEP   PATIENT EDUCATION:  Education details: Patient educated on exam findings, POC, scope of PT, HEP, and avoiding hyperextension. Person educated: Patient Education method: Explanation, Demonstration, and Handouts Education comprehension: verbalized understanding, returned demonstration, verbal cues required, and tactile cues required  HOME EXERCISE PROGRAM: Access Code: [email or web address]  Date: 01/13/2022 - Seated Long Arc Quad (Mirrored)  - 3 x daily - 7 x weekly - 2 sets - 10 reps - 5 second hold - Supine Bridge  - 3 x daily - 7 x weekly - 2 sets - 10 reps - Sidelying Hip Abduction (Mirrored)  - 3 x daily - 7 x weekly - 2 sets - 10 reps  ASSESSMENT:  CLINICAL IMPRESSION: Performed gradual progression of LE strengthening exercises, stabilization techniques for Lt knee, and functional mobility tasks focusing on motor control and symmetry. Cues for alignment, preventing genuvalgus collapse with WB activities. Greatly  improved gait symmetry with SPC education. Tolerated without increase in symptoms reported. Patient will continue to benefit from skilled physical therapy services to further improve independence with functional mobility.   OBJECTIVE IMPAIRMENTS Abnormal gait, decreased activity tolerance, decreased balance, decreased endurance, decreased mobility, difficulty walking, decreased ROM, decreased strength, increased edema, increased fascial restrictions, increased muscle spasms, impaired flexibility, improper body mechanics, obesity, and pain.   ACTIVITY LIMITATIONS carrying, lifting, bending, standing, squatting, stairs, transfers, locomotion level, and caring for others  PARTICIPATION LIMITATIONS: meal prep, cleaning, laundry, shopping, community activity, and yard work  Gray, Time since onset of injury/illness/exacerbation, and 3+ comorbidities: chronic B knee pain, R hip pain, back pain, obesity, HTN, DM  are also affecting patient's functional outcome.   REHAB POTENTIAL: Good  CLINICAL DECISION MAKING: Evolving/moderate complexity  EVALUATION COMPLEXITY: Moderate   GOALS: Goals reviewed with patient? Yes  SHORT TERM GOALS:  Target date: 02/03/2022  Patient will be independent with HEP in order to improve functional outcomes. Baseline:  Goal status: INITIAL  2.  Patient will report at least 25% improvement in symptoms for improved quality of life. Baseline:  Goal status: INITIAL    LONG TERM GOALS: Target date: 02/24/2022  Patient will report at least 75% improvement in symptoms for improved quality of life. Baseline:  Goal status: INITIAL  2.  Patient will improve LEFS score by at least 12 points in order to indicate improved tolerance to activity. Baseline: 36/80 Goal status: INITIAL  3.  Patient will be able to navigate stairs with reciprocal pattern without compensation in order to demonstrate improved LE strength. Baseline: step too using RLE Goal  status: INITIAL  4.  Patient will be able to ambulate at least 300 feet in in order to demonstrate improved tolerance to activity. Baseline: 230 feet Goal status: INITIAL  5.  Patient will improve ROM for L knee extension/flexion to 0-115 degrees to improve squatting, and other functional mobility. Baseline: 4 hyperextension to 104 Goal status: INITIAL  6.  Patient will demonstrate grade of 5/5 MMT grade in all tested musculature as evidence of improved strength to assist with stair ambulation and gait.   Baseline: see MMT Goal status: INITIAL    PLAN: PT FREQUENCY: 2x/week  PT DURATION: 6 weeks  PLANNED INTERVENTIONS: Therapeutic exercises, Therapeutic activity, Neuromuscular re-education, Balance training, Gait training, Patient/Family education, Joint manipulation, Joint mobilization, Stair training, Orthotic/Fit training, DME instructions, Aquatic Therapy, Dry Needling, Electrical stimulation, Spinal manipulation, Spinal mobilization, Cryotherapy, Moist heat, Compression bandaging, scar mobilization, Splintting, Taping, Traction, Ultrasound, Ionotophoresis 4mg /ml Dexamethasone, and Manual therapy   PLAN FOR NEXT SESSION: quad strength, functional strength, L quad motor control to avoid hyperextension, bilateral hip strength, gait and balance training  , PT, DPT Physical Therapist [facility]  01/15/2022, 9:57 AM

## 2022-01-18 ENCOUNTER — Ambulatory Visit (HOSPITAL_COMMUNITY): Payer: Medicaid Other | Attending: Internal Medicine | Admitting: Physical Therapy

## 2022-01-18 DIAGNOSIS — M6281 Muscle weakness (generalized): Secondary | ICD-10-CM | POA: Diagnosis not present

## 2022-01-18 DIAGNOSIS — R262 Difficulty in walking, not elsewhere classified: Secondary | ICD-10-CM | POA: Insufficient documentation

## 2022-01-18 DIAGNOSIS — R2689 Other abnormalities of gait and mobility: Secondary | ICD-10-CM | POA: Insufficient documentation

## 2022-01-18 DIAGNOSIS — M79605 Pain in left leg: Secondary | ICD-10-CM | POA: Diagnosis not present

## 2022-01-18 DIAGNOSIS — M25562 Pain in left knee: Secondary | ICD-10-CM | POA: Diagnosis not present

## 2022-01-18 DIAGNOSIS — M25561 Pain in right knee: Secondary | ICD-10-CM | POA: Diagnosis not present

## 2022-01-18 DIAGNOSIS — G8929 Other chronic pain: Secondary | ICD-10-CM | POA: Insufficient documentation

## 2022-01-18 NOTE — Therapy (Signed)
OUTPATIENT PHYSICAL THERAPY LOWER EXTREMITY TREATMENT   Patient Name: [name] MRN: [id number] DOB:1985/07/06, 36 y.o., female Today's Date: 01/18/2022   PT End of Session - 01/18/22 1306     Visit Number 3    Number of Visits 12    Date for PT Re-Evaluation 02/24/22    Authorization Type Medicaid [employer]    Authorization Time Period Approved 16 visits (9/27-12/26)    Authorization - Visit Number 2    Authorization - Number of Visits 16    Progress Note Due on Visit 10    PT Start Time 1305    PT Stop Time 1343    PT Time Calculation (min) 38 min    Activity Tolerance Patient tolerated treatment well    Behavior During Therapy WFL for tasks assessed/performed             Past Medical History:  Diagnosis Date   Arthritis    Hypertension    Kidney infection    UTI (urinary tract infection)    Vaginal Pap smear, abnormal    Past Surgical History:  Procedure Laterality Date   BLADDER SURGERY     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   CESAREAN SECTION     COLPOSCOPY     Patient Active Problem List   Diagnosis Date Noted   Acquired deformity of parts of limb 08/24/2021   DM (diabetes mellitus) (HCC) 08/11/2021   HTN (hypertension) 08/11/2021   Osteoarthritis 08/11/2021   Frequent UTI 07/08/2021   History of exstrophy of bladder 07/08/2021   Incomplete bladder emptying 07/08/2021   Nephrolithiasis 07/08/2021   Prediabetes 06/15/2021   Morbid obesity (HCC) 08/25/2020   Essential hypertension, benign 11/08/2019   History of cesarean delivery 08/01/2019   BMI 50.0-59.9, adult (HCC) 08/01/2019   IUFD at less than 20 weeks of gestation 07/31/2019   Marijuana use 06/21/2019   Smoker 06/20/2019   Abnormal Pap smear of cervix 06/20/2019   Depression with anxiety 06/20/2019    PCP: Benetta Spar MD  REFERRING PROVIDER: Forest Becker, MD   REFERRING DIAG: tibial deformity, acquired, left (O87.867)   THERAPY DIAG:  Muscle weakness  (generalized)  Difficulty in walking, not elsewhere classified  Other abnormalities of gait and mobility  Pain in left leg  Rationale for Evaluation and Treatment Rehabilitation  ONSET DATE: 10/08/2021   SUBJECTIVE:   SUBJECTIVE STATEMENT: Still having some pressure win surgical area. Working HEP without issues.   PERTINENT HISTORY: s/p L IMN Proximal tibial with osteotomy dos: 10/08/2021, chronic B knee pain, R hip pain, back pain, obesity   PAIN:  Are you having pain? Yes: NPRS scale: 3-4/10 Pain location: L shin/knee Pain description: discomfort Aggravating factors: weightbearing, walking Relieving factors: rest  PRECAUTIONS: None  WEIGHT BEARING RESTRICTIONS No  FALLS:  Has patient fallen in last 6 months? No  LIVING ENVIRONMENT: Lives with: lives with their family Lives in: Mobile home Stairs: Yes: External: 6 steps; can reach both Has following equipment at home: Single point cane, Walker - 2 wheeled, Wheelchair (manual), and bed side commode  OCCUPATION: [employer]  PLOF: Independent  PATIENT GOALS improve mobility   OBJECTIVE:   PATIENT SURVEYS:  LEFS 36/80  COGNITION:  Overall cognitive status: Within functional limits for tasks assessed     SENSATION: WFL   POSTURE: rounded shoulders and forward head  PALPATION: No TTP  LOWER EXTREMITY ROM:  Active ROM Right eval Left eval  Hip flexion    Hip extension  Hip abduction    Hip adduction    Hip internal rotation    Hip external rotation    Knee flexion 115 104  Knee extension  4 hyperextension  Ankle dorsiflexion    Ankle plantarflexion    Ankle inversion    Ankle eversion     (Blank rows = not tested)  LOWER EXTREMITY MMT:  MMT Right eval Left eval  Hip flexion 4+ 4+  Hip extension 4+ 4+  Hip abduction 3+ 4  Hip adduction    Hip internal rotation    Hip external rotation    Knee flexion 5 4  Knee extension 5 4-*  Ankle dorsiflexion 5 5  Ankle plantarflexion     Ankle inversion    Ankle eversion     (Blank rows = not tested)   FUNCTIONAL TESTS:  5 times sit to stand: 15.70 seconds 2 minute walk test: 230 feet  GAIT: Distance walked: 230 feet Assistive device utilized: None Level of assistance: Complete Independence Comments: 2MWT, antalgic, limited knee flexion/extension ROM bilateral, severe valgus fatigued during/following in LEs    TODAY'S TREATMENT: 01/18/22 Bridge 2 x 10 SLR x 10 each (Increased difficulty on LLE)  Sidelying hip abduction 2 x 10   Mini squat 2 x 10 Wall sit (approx 45 deg bend) 5 x 10" TKE 15 x 3" each 3 plates  Band sidestepping RTB 3 RT in bars    01/15/22 NuStep lvl 1 x5 min, warm-up, strength, ROM, while reviewing outcomes from initial visit. LAQ 2# 2x10 Standing HS curl 2# 2x10 Sidelying hip abduction 2x10 Bridge 2x10 SLR 2x10 Hooklying isometric hip abduction with ball 2x10 3" Hooklying BTB hip abduction 2x10 Mini squats with bil hand support 2x10 Semi-tandem stance fading hand support 2x30" ea Gait training with SPC, 100' demonstrating and teaching back SPC use, 2pt gait pattern.    01/13/22 LAQ x 10 5 second holds Bridge x 10 - educated for HEP Hip abduction x 10  - educated for HEP   PATIENT EDUCATION:  Education details: Patient educated on exam findings, POC, scope of PT, HEP, and avoiding hyperextension. Person educated: Patient Education method: Explanation, Demonstration, and Handouts Education comprehension: verbalized understanding, returned demonstration, verbal cues required, and tactile cues required  HOME EXERCISE PROGRAM: Access Code: [email or web address]  01/18/22 - Mini Squat with Counter Support  - 3 x daily - 7 x weekly - 2 sets - 10 reps - Heel Raises with Counter Support  - 3 x daily - 7 x weekly - 2 sets - 10 reps - Side Stepping with Resistance at Ankles and Counter Support  - 1 x daily - 7 x weekly - 1 sets - 10 reps  Date: 01/13/2022 - Seated Long Arc Quad (Mirrored)  - 3  x daily - 7 x weekly - 2 sets - 10 reps - 5 second hold - Supine Bridge  - 3 x daily - 7 x weekly - 2 sets - 10 reps - Sidelying Hip Abduction (Mirrored)  - 3 x daily - 7 x weekly - 2 sets - 10 reps  ASSESSMENT:  CLINICAL IMPRESSION: Patient tolerates session well. Continues to demo glute weakness and significant gait deviations. Progressed glute and quad strengthening activity as indicated in flow sheet. Patient educated on purpose and function of all added exercises. Noting increased muscle fatigue end of session. Patient will continue to benefit from skilled therapy services to reduce remaining deficits and improve functional ability.    OBJECTIVE IMPAIRMENTS Abnormal gait, decreased activity  tolerance, decreased balance, decreased endurance, decreased mobility, difficulty walking, decreased ROM, decreased strength, increased edema, increased fascial restrictions, increased muscle spasms, impaired flexibility, improper body mechanics, obesity, and pain.   ACTIVITY LIMITATIONS carrying, lifting, bending, standing, squatting, stairs, transfers, locomotion level, and caring for others  PARTICIPATION LIMITATIONS: meal prep, cleaning, laundry, shopping, community activity, and yard work  PERSONAL FACTORS Fitness, Time since onset of injury/illness/exacerbation, and 3+ comorbidities: chronic B knee pain, R hip pain, back pain, obesity, HTN, DM  are also affecting patient's functional outcome.   REHAB POTENTIAL: Good  CLINICAL DECISION MAKING: Evolving/moderate complexity  EVALUATION COMPLEXITY: Moderate   GOALS: Goals reviewed with patient? Yes  SHORT TERM GOALS: Target date: 02/03/2022  Patient will be independent with HEP in order to improve functional outcomes. Baseline:  Goal status: INITIAL  2.  Patient will report at least 25% improvement in symptoms for improved quality of life. Baseline:  Goal status: INITIAL    LONG TERM GOALS: Target date: 02/24/2022  Patient will  report at least 75% improvement in symptoms for improved quality of life. Baseline:  Goal status: INITIAL  2.  Patient will improve LEFS score by at least 12 points in order to indicate improved tolerance to activity. Baseline: 36/80 Goal status: INITIAL  3.  Patient will be able to navigate stairs with reciprocal pattern without compensation in order to demonstrate improved LE strength. Baseline: step too using RLE Goal status: INITIAL  4.  Patient will be able to ambulate at least 300 feet in in order to demonstrate improved tolerance to activity. Baseline: 230 feet Goal status: INITIAL  5.  Patient will improve ROM for L knee extension/flexion to 0-115 degrees to improve squatting, and other functional mobility. Baseline: 4 hyperextension to 104 Goal status: INITIAL  6.  Patient will demonstrate grade of 5/5 MMT grade in all tested musculature as evidence of improved strength to assist with stair ambulation and gait.   Baseline: see MMT Goal status: INITIAL    PLAN: PT FREQUENCY: 2x/week  PT DURATION: 6 weeks  PLANNED INTERVENTIONS: Therapeutic exercises, Therapeutic activity, Neuromuscular re-education, Balance training, Gait training, Patient/Family education, Joint manipulation, Joint mobilization, Stair training, Orthotic/Fit training, DME instructions, Aquatic Therapy, Dry Needling, Electrical stimulation, Spinal manipulation, Spinal mobilization, Cryotherapy, Moist heat, Compression bandaging, scar mobilization, Splintting, Taping, Traction, Ultrasound, Ionotophoresis 4mg /ml Dexamethasone, and Manual therapy   PLAN FOR NEXT SESSION: quad strength, functional strength, L quad motor control to avoid hyperextension, bilateral hip strength, gait and balance training  1:06 PM, 01/18/22 03/20/22 PT DPT  Physical Therapist with [place]  [facility]  [phone]

## 2022-01-28 ENCOUNTER — Ambulatory Visit (HOSPITAL_COMMUNITY): Payer: Medicaid Other | Admitting: Physical Therapy

## 2022-01-28 DIAGNOSIS — R2689 Other abnormalities of gait and mobility: Secondary | ICD-10-CM | POA: Diagnosis not present

## 2022-01-28 DIAGNOSIS — R262 Difficulty in walking, not elsewhere classified: Secondary | ICD-10-CM

## 2022-01-28 DIAGNOSIS — M25562 Pain in left knee: Secondary | ICD-10-CM | POA: Diagnosis not present

## 2022-01-28 DIAGNOSIS — M6281 Muscle weakness (generalized): Secondary | ICD-10-CM

## 2022-01-28 DIAGNOSIS — M79605 Pain in left leg: Secondary | ICD-10-CM

## 2022-01-28 DIAGNOSIS — M25561 Pain in right knee: Secondary | ICD-10-CM | POA: Diagnosis not present

## 2022-01-28 DIAGNOSIS — G8929 Other chronic pain: Secondary | ICD-10-CM | POA: Diagnosis not present

## 2022-01-28 NOTE — Therapy (Signed)
OUTPATIENT PHYSICAL THERAPY LOWER EXTREMITY TREATMENT   Patient Name: [name] MRN: [id number] DOB:30-Aug-1985, 36 y.o., female Today's Date: 01/28/2022   PT End of Session - 01/28/22 0904     Visit Number 4    Number of Visits 12    Date for PT Re-Evaluation 02/24/22    Authorization Type Medicaid [employer]    Authorization Time Period Approved 16 visits (9/27-12/26)    Authorization - Visit Number 3    Authorization - Number of Visits 16    Progress Note Due on Visit 10    PT Start Time 0815    PT Stop Time 2025    PT Time Calculation (min) 40 min    Activity Tolerance Patient tolerated treatment well    Behavior During Therapy WFL for tasks assessed/performed              Past Medical History:  Diagnosis Date   Arthritis    Hypertension    Kidney infection    UTI (urinary tract infection)    Vaginal Pap smear, abnormal    Past Surgical History:  Procedure Laterality Date   BLADDER SURGERY     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   CESAREAN SECTION     COLPOSCOPY     Patient Active Problem List   Diagnosis Date Noted   Acquired deformity of parts of limb 08/24/2021   DM (diabetes mellitus) ([place]) 08/11/2021   HTN (hypertension) 08/11/2021   Osteoarthritis 08/11/2021   Frequent UTI 07/08/2021   History of exstrophy of bladder 07/08/2021   Incomplete bladder emptying 07/08/2021   Nephrolithiasis 07/08/2021   Prediabetes 06/15/2021   Morbid obesity ([place]) 08/25/2020   Essential hypertension, benign 11/08/2019   History of cesarean delivery 08/01/2019   BMI 50.0-59.9, adult ([place]) 08/01/2019   IUFD at less than 20 weeks of gestation 07/31/2019   Marijuana use 06/21/2019   Smoker 06/20/2019   Abnormal Pap smear of cervix 06/20/2019   Depression with anxiety 06/20/2019    PCP: Carrolyn Meiers MD  REFERRING PROVIDER: Val Eagle, MD   REFERRING DIAG: tibial deformity, acquired, left (K27.062)   THERAPY DIAG:  Muscle weakness  (generalized)  Difficulty in walking, not elsewhere classified  Other abnormalities of gait and mobility  Pain in left leg  Rationale for Evaluation and Treatment Rehabilitation  ONSET DATE: 10/08/2021   SUBJECTIVE:   SUBJECTIVE STATEMENT: Pt states that after last session and a walking about 25 minutes she was so sore she could barely walk.   PERTINENT HISTORY: s/p L IMN Proximal tibial with osteotomy dos: 10/08/2021, chronic B knee pain, R hip pain, back pain, obesity   PAIN:  Are you having pain? Yes: NPRS scale: 5/10 Pain location: L shin/knee Pain description: discomfort Aggravating factors: weightbearing, walking Relieving factors: rest  PRECAUTIONS: None  WEIGHT BEARING RESTRICTIONS No  FALLS:  Has patient fallen in last 6 months? No  LIVING ENVIRONMENT: Lives with: lives with their family Lives in: Mobile home Stairs: Yes: External: 6 steps; can reach both Has following equipment at home: Single point cane, Walker - 2 wheeled, Wheelchair (manual), and bed side commode  OCCUPATION: [employer]  PLOF: Independent  PATIENT GOALS improve mobility   OBJECTIVE:   PATIENT SURVEYS:  LEFS 36/80  COGNITION:  Overall cognitive status: Within functional limits for tasks assessed     SENSATION: WFL   POSTURE: rounded shoulders and forward head  PALPATION: No TTP  LOWER EXTREMITY ROM:  Active ROM Right eval  Left eval  Hip flexion    Hip extension    Hip abduction    Hip adduction    Hip internal rotation    Hip external rotation    Knee flexion 115 104  Knee extension  4 hyperextension  Ankle dorsiflexion    Ankle plantarflexion    Ankle inversion    Ankle eversion     (Blank rows = not tested)  LOWER EXTREMITY MMT:  MMT Right eval Left eval  Hip flexion 4+ 4+  Hip extension 4+ 4+  Hip abduction 3+ 4  Hip adduction    Hip internal rotation    Hip external rotation    Knee flexion 5 4  Knee extension 5 4-*  Ankle dorsiflexion 5  5  Ankle plantarflexion    Ankle inversion    Ankle eversion     (Blank rows = not tested)   FUNCTIONAL TESTS:  5 times sit to stand: 15.70 seconds 2 minute walk test: 230 feet  GAIT: Distance walked: 230 feet Assistive device utilized: None Level of assistance: Complete Independence Comments: 2MWT, antalgic, limited knee flexion/extension ROM bilateral, severe valgus fatigued during/following in LEs    TODAY'S TREATMENT:  01/28/22             Standing:             Rocker board x 2'             Heel raises x 10              Minisquat x 10              Semi tandem stance x 3 B              Slant board 30" x 2            Terminal knee extension 4 pl x 10 each              Side stepping red thera-band down long counter in front x 1 RT             Hip extension with red thera-band x 10 Each             Knee flexion with red thera-band x 10 each              Leg press 3 Pl x 15      01/18/22 Bridge 2 x 10 SLR x 10 each (Increased difficulty on LLE)  Sidelying hip abduction 2 x 10   Mini squat 2 x 10 Wall sit (approx 45 deg bend) 5 x 10" TKE 15 x 3" each 3 plates  Band sidestepping RTB 3 RT in bars    01/15/22 NuStep lvl 1 x5 min, warm-up, strength, ROM, while reviewing outcomes from initial visit. LAQ 2# 2x10 Standing HS curl 2# 2x10 Sidelying hip abduction 2x10 Bridge 2x10 SLR 2x10 Hooklying isometric hip abduction with ball 2x10 3" Hooklying BTB hip abduction 2x10 Mini squats with bil hand support 2x10 Semi-tandem stance fading hand support 2x30" ea Gait training with SPC, 100' demonstrating and teaching back SPC use, 2pt gait pattern.    01/13/22 LAQ x 10 5 second holds Bridge x 10 - educated for HEP Hip abduction x 10  - educated for HEP   PATIENT EDUCATION:  Education details: Patient educated on exam findings, POC, scope of PT, HEP, and avoiding hyperextension. Person educated: Patient Education method: Explanation, Demonstration, and  Handouts Education comprehension: verbalized understanding, returned demonstration,  verbal cues required, and tactile cues required  HOME EXERCISE PROGRAM:  01/28/22: tadem stance Access Code: [email or web address] Exercises - Standing Tandem Balance with Counter Support  - 1 x daily - 7 x weekly - 1 sets - 3 reps - as long as possible  hold Exercises - Standing Tandem Balance with Counter Support  - 1 x daily - 7 x weekly - 1 sets - 3 reps - as long as possible  hold 01/18/22 - Mini Squat with Counter Support  - 3 x daily - 7 x weekly - 2 sets - 10 reps - Heel Raises with Counter Support  - 3 x daily - 7 x weekly - 2 sets - 10 reps - Side Stepping with Resistance at Ankles and Counter Support  - 1 x daily - 7 x weekly - 1 sets - 10 reps  Date: 01/13/2022 - Seated Long Arc Quad (Mirrored)  - 3 x daily - 7 x weekly - 2 sets - 10 reps - 5 second hold - Supine Bridge  - 3 x daily - 7 x weekly - 2 sets - 10 reps - Sidelying Hip Abduction (Mirrored)  - 3 x daily - 7 x weekly - 2 sets - 10 reps  ASSESSMENT:  CLINICAL IMPRESSION: Pt needs to be monitored as while the pt is completing the exercise she voices no complaint but then makes verbal grunting as if the exercise was painful for her; when asked she states the pain just came on at the end. Therapist recommended pt completing 100 ab sets a day to improve core strength.  Patient will continue to benefit from skilled therapy services to reduce remaining deficits and improve functional ability.    OBJECTIVE IMPAIRMENTS Abnormal gait, decreased activity tolerance, decreased balance, decreased endurance, decreased mobility, difficulty walking, decreased ROM, decreased strength, increased edema, increased fascial restrictions, increased muscle spasms, impaired flexibility, improper body mechanics, obesity, and pain.   ACTIVITY LIMITATIONS carrying, lifting, bending, standing, squatting, stairs, transfers, locomotion level, and caring for  others  PARTICIPATION LIMITATIONS: meal prep, cleaning, laundry, shopping, community activity, and yard work  [place], Time since onset of injury/illness/exacerbation, and 3+ comorbidities: chronic B knee pain, R hip pain, back pain, obesity, HTN, DM  are also affecting patient's functional outcome.   REHAB POTENTIAL: Good  CLINICAL DECISION MAKING: Evolving/moderate complexity  EVALUATION COMPLEXITY: Moderate   GOALS: Goals reviewed with patient? Yes  SHORT TERM GOALS: Target date: 02/03/2022  Patient will be independent with HEP in order to improve functional outcomes. Baseline:  Goal status: MET  2.  Patient will report at least 25% improvement in symptoms for improved quality of life. Baseline:  Goal status: IN PROGRESS    LONG TERM GOALS: Target date: 02/24/2022  Patient will report at least 75% improvement in symptoms for improved quality of life. Baseline:  Goal status: IN PROGRESS  2.  Patient will improve LEFS score by at least 12 points in order to indicate improved tolerance to activity. Baseline: 36/80 Goal status: IN PROGRESS  3.  Patient will be able to navigate stairs with reciprocal pattern without compensation in order to demonstrate improved LE strength. Baseline: step too using RLE Goal status: IN PROGRESS  4.  Patient will be able to ambulate at least 300 feet in 2MWT in order to demonstrate improved tolerance to activity. Baseline: 230 feet Goal status: IN PROGRESS  5.  Patient will improve ROM for L knee extension/flexion to 0-115 degrees to improve squatting, and other functional mobility.  Baseline: 4 hyperextension to 104 Goal status: IN PROGRESS  6.  Patient will demonstrate grade of 5/5 MMT grade in all tested musculature as evidence of improved strength to assist with stair ambulation and gait.   Baseline: see MMT Goal status: IN PROGRESS    PLAN: PT FREQUENCY: 2x/week  PT DURATION: 6 weeks  PLANNED INTERVENTIONS:  Therapeutic exercises, Therapeutic activity, Neuromuscular re-education, Balance training, Gait training, Patient/Family education, Joint manipulation, Joint mobilization, Stair training, Orthotic/Fit training, DME instructions, Aquatic Therapy, Dry Needling, Electrical stimulation, Spinal manipulation, Spinal mobilization, Cryotherapy, Moist heat, Compression bandaging, scar mobilization, Splintting, Taping, Traction, Ultrasound, Ionotophoresis 4mg /ml Dexamethasone, and Manual therapy   PLAN FOR NEXT SESSION: quad strength, functional strength, L quad motor control to avoid hyperextension, bilateral hip strength, gait and balance training Rayetta Humphrey, PT CLT [phone]  900AM

## 2022-01-29 ENCOUNTER — Encounter (HOSPITAL_COMMUNITY): Payer: Medicaid Other

## 2022-02-02 ENCOUNTER — Encounter: Payer: Self-pay | Admitting: Orthopaedic Surgery

## 2022-02-02 ENCOUNTER — Ambulatory Visit: Payer: Medicaid Other | Admitting: Orthopaedic Surgery

## 2022-02-02 DIAGNOSIS — Q6589 Other specified congenital deformities of hip: Secondary | ICD-10-CM | POA: Diagnosis not present

## 2022-02-02 DIAGNOSIS — G8929 Other chronic pain: Secondary | ICD-10-CM

## 2022-02-02 DIAGNOSIS — M25561 Pain in right knee: Secondary | ICD-10-CM

## 2022-02-02 DIAGNOSIS — M129 Arthropathy, unspecified: Secondary | ICD-10-CM | POA: Diagnosis not present

## 2022-02-02 DIAGNOSIS — M7061 Trochanteric bursitis, right hip: Secondary | ICD-10-CM

## 2022-02-02 DIAGNOSIS — I10 Essential (primary) hypertension: Secondary | ICD-10-CM | POA: Diagnosis not present

## 2022-02-02 MED ORDER — METHYLPREDNISOLONE ACETATE 40 MG/ML IJ SUSP
40.0000 mg | Freq: Once | INTRAMUSCULAR | Status: AC
  Administered 2022-02-02: 40 mg via INTRA_ARTICULAR

## 2022-02-02 MED ORDER — HYDROCODONE-ACETAMINOPHEN 5-325 MG PO TABS
ORAL_TABLET | ORAL | 0 refills | Status: DC
Start: 2022-02-02 — End: 2022-03-09

## 2022-02-02 NOTE — Progress Notes (Signed)
PROCEDURE NOTE:  The patient requests injections of the right knee , verbal consent was obtained.  The right knee was prepped appropriately after time out was performed.   Sterile technique was observed and injection of 1 cc of DepoMedrol 40mg  with several cc's of plain xylocaine. Anesthesia was provided by ethyl chloride and a 20-gauge needle was used to inject the knee area. The injection was tolerated well.  A band aid dressing was applied.  The patient was advised to apply ice later today and tomorrow to the injection sight as needed.  PROCEDURE NOTE:  The patient request injection, verbal consent was obtained.  The right trochanteric area of the hip was prepped appropriately after time out was performed.   Sterile technique was observed and injection of 1 cc of DepoMedrol 40 mg with several cc's of plain xylocaine. Anesthesia was provided by ethyl chloride and a 20-gauge needle was used to inject the hip area. The injection was tolerated well.  A band aid dressing was applied.  The patient was advised to apply ice later today and tomorrow to the injection sight as needed.  Encounter Diagnoses  Name Primary?   Chronic pain of right knee Yes   Trochanteric bursitis, right hip    I have reviewed the [place] web site prior to prescribing narcotic medicine for this patient.  Return in six weeks.  Call if any problem.  Precautions discussed.  Electronically Signed Sanjuana Kava, MD 10/17/20238:22 AM

## 2022-02-02 NOTE — Addendum Note (Signed)
Addended by: Obie Dredge A on: 02/02/2022 08:48 AM   Modules accepted: Orders

## 2022-02-03 ENCOUNTER — Telehealth (HOSPITAL_COMMUNITY): Payer: Self-pay | Admitting: Physical Therapy

## 2022-02-03 ENCOUNTER — Encounter (HOSPITAL_COMMUNITY): Payer: Medicaid Other | Admitting: Physical Therapy

## 2022-02-03 NOTE — Telephone Encounter (Signed)
Patient no show, left message for patient to make her aware of missed appointment and reminded her of next.  12:02 PM, 02/03/22 Mearl Latin PT, DPT Physical Therapist at [facility]

## 2022-02-04 ENCOUNTER — Encounter (HOSPITAL_COMMUNITY): Payer: Self-pay

## 2022-02-04 ENCOUNTER — Ambulatory Visit (HOSPITAL_COMMUNITY): Payer: Medicaid Other

## 2022-02-04 DIAGNOSIS — M6281 Muscle weakness (generalized): Secondary | ICD-10-CM | POA: Diagnosis not present

## 2022-02-04 DIAGNOSIS — R2689 Other abnormalities of gait and mobility: Secondary | ICD-10-CM

## 2022-02-04 DIAGNOSIS — M25561 Pain in right knee: Secondary | ICD-10-CM | POA: Diagnosis not present

## 2022-02-04 DIAGNOSIS — G8929 Other chronic pain: Secondary | ICD-10-CM | POA: Diagnosis not present

## 2022-02-04 DIAGNOSIS — M79605 Pain in left leg: Secondary | ICD-10-CM

## 2022-02-04 DIAGNOSIS — R262 Difficulty in walking, not elsewhere classified: Secondary | ICD-10-CM

## 2022-02-04 DIAGNOSIS — M25562 Pain in left knee: Secondary | ICD-10-CM | POA: Diagnosis not present

## 2022-02-04 NOTE — Therapy (Signed)
OUTPATIENT PHYSICAL THERAPY LOWER EXTREMITY TREATMENT   Patient Name: [name] MRN: [id number] DOB:10/21/1985, 36 y.o., female Today's Date: 02/04/2022   PT End of Session - 02/04/22 1300     Visit Number 5    Number of Visits 12    Date for PT Re-Evaluation 02/24/22    Authorization Type Medicaid [employer]    Authorization Time Period Approved 16 visits (9/27-12/26)    Authorization - Visit Number 4    Authorization - Number of Visits 16    Progress Note Due on Visit 10    PT Start Time 1301    PT Stop Time 1340    PT Time Calculation (min) 39 min    Activity Tolerance Patient tolerated treatment well    Behavior During Therapy WFL for tasks assessed/performed              Past Medical History:  Diagnosis Date   Arthritis    Hypertension    Kidney infection    UTI (urinary tract infection)    Vaginal Pap smear, abnormal    Past Surgical History:  Procedure Laterality Date   BLADDER SURGERY     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   CESAREAN SECTION     COLPOSCOPY     Patient Active Problem List   Diagnosis Date Noted   Acquired deformity of parts of limb 08/24/2021   DM (diabetes mellitus) ([place]) 08/11/2021   HTN (hypertension) 08/11/2021   Osteoarthritis 08/11/2021   Frequent UTI 07/08/2021   History of exstrophy of bladder 07/08/2021   Incomplete bladder emptying 07/08/2021   Nephrolithiasis 07/08/2021   Prediabetes 06/15/2021   Morbid obesity ([place]) 08/25/2020   Essential hypertension, benign 11/08/2019   History of cesarean delivery 08/01/2019   BMI 50.0-59.9, adult ([place]) 08/01/2019   IUFD at less than 20 weeks of gestation 07/31/2019   Marijuana use 06/21/2019   Smoker 06/20/2019   Abnormal Pap smear of cervix 06/20/2019   Depression with anxiety 06/20/2019    PCP: Carrolyn Meiers MD  REFERRING PROVIDER: Val Eagle, MD   REFERRING DIAG: tibial deformity, acquired, 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  Reports she has been compliant with HEP, does a lot of seated or bed exercise.  PERTINENT HISTORY: s/p L IMN Proximal tibial with osteotomy dos: 10/08/2021, chronic B knee pain, R hip pain, back pain, obesity   PAIN:  Are you having pain? Yes: NPRS scale: 3-4/10 Pain location: L shin/knee and Rt hip Pain description: discomfort Aggravating factors: weightbearing, walking Relieving factors: rest  PRECAUTIONS: None  WEIGHT BEARING RESTRICTIONS No  FALLS:  Has patient fallen in last 6 months? No  LIVING ENVIRONMENT: Lives with: lives with their family Lives in: Mobile home Stairs: Yes: External: 6 steps; can reach both Has following equipment at home: Single point cane, Walker - 2 wheeled, Wheelchair (manual), and bed side commode  OCCUPATION: [employer]  PLOF: Independent  PATIENT GOALS improve mobility   OBJECTIVE:   PATIENT SURVEYS:  LEFS 36/80  COGNITION:  Overall cognitive status: Within functional limits for tasks assessed     SENSATION: WFL   POSTURE: rounded shoulders and forward  head  PALPATION: No TTP  LOWER EXTREMITY ROM:  Active ROM Right eval Left eval  Hip flexion    Hip extension    Hip abduction    Hip adduction    Hip internal rotation    Hip external rotation    Knee flexion 115 104  Knee extension  4 hyperextension  Ankle dorsiflexion    Ankle plantarflexion    Ankle inversion    Ankle eversion     (Blank rows = not tested)  LOWER EXTREMITY MMT:  MMT Right eval Left eval  Hip flexion 4+ 4+  Hip extension 4+ 4+  Hip abduction 3+ 4  Hip adduction    Hip internal rotation    Hip external rotation     Knee flexion 5 4  Knee extension 5 4-*  Ankle dorsiflexion 5 5  Ankle plantarflexion    Ankle inversion    Ankle eversion     (Blank rows = not tested)   FUNCTIONAL TESTS:  5 times sit to stand: 15.70 seconds 2 minute walk test: 230 feet  GAIT: Distance walked: 230 feet Assistive device utilized: None Level of assistance: Complete Independence Comments: 2MWT, antalgic, limited knee flexion/extension ROM bilateral, severe valgus fatigued during/following in LEs    TODAY'S TREATMENT: 02/04/22 Standing:  Heel raises 10x 5"   Sidestep 2RT RTB around thigh (12 ft)  Retro gait 1RT RTB around thigh (58ft)   Minisquat 10x front of chair   Hamstring curls 3# 10x 3" with HHA   Pallof in partial tandem stance with GTB 4x 10 reps    LAQ 3# 10x   01/28/22             Standing:             Rocker board x 2'             Heel raises x 10              Minisquat x 10              Semi tandem stance x 3 B              Slant board 30" x 2            Terminal knee extension 4 pl x 10 each              Side stepping red thera-band down long counter in front x 1 RT             Hip extension with red thera-band x 10 Each             Knee flexion with red thera-band x 10 each              Leg press 3 Pl x 15      01/18/22 Bridge 2 x 10 SLR x 10 each (Increased difficulty on LLE)  Sidelying hip abduction 2 x 10   Mini squat 2 x 10 Wall sit (approx 45 deg bend) 5 x 10" TKE 15 x 3" each 3 plates  Band sidestepping RTB 3 RT in bars    01/15/22 NuStep lvl 1 x5 min, warm-up, strength, ROM, while reviewing outcomes from initial visit. LAQ 2# 2x10 Standing HS curl 2# 2x10 Sidelying hip abduction 2x10 Bridge 2x10 SLR 2x10 Hooklying isometric hip abduction with ball 2x10 3" Hooklying BTB hip abduction 2x10 Mini squats with bil hand support 2x10 Semi-tandem stance fading hand support 2x30" ea Gait training with  SPC, 100' demonstrating and teaching back SPC use, 2pt gait pattern.     01/13/22 LAQ x 10 5 second holds Bridge x 10 - educated for HEP Hip abduction x 10  - educated for HEP   PATIENT EDUCATION:  Education details: Patient educated on exam findings, POC, scope of PT, HEP, and avoiding hyperextension. Person educated: Patient Education method: Explanation, Demonstration, and Handouts Education comprehension: verbalized understanding, returned demonstration, verbal cues required, and tactile cues required  HOME EXERCISE PROGRAM:  01/28/22: tadem stance Access Code: [email or web address] Exercises - Standing Tandem Balance with Counter Support  - 1 x daily - 7 x weekly - 1 sets - 3 reps - as long as possible  hold Exercises - Standing Tandem Balance with Counter Support  - 1 x daily - 7 x weekly - 1 sets - 3 reps - as long as possible  hold 01/18/22 - Mini Squat with Counter Support  - 3 x daily - 7 x weekly - 2 sets - 10 reps - Heel Raises with Counter Support  - 3 x daily - 7 x weekly - 2 sets - 10 reps - Side Stepping with Resistance at Ankles and Counter Support  - 1 x daily - 7 x weekly - 1 sets - 10 reps  Date: 01/13/2022 - Seated Long Arc Quad (Mirrored)  - 3 x daily - 7 x weekly - 2 sets - 10 reps - 5 second hold - Supine Bridge  - 3 x daily - 7 x weekly - 2 sets - 10 reps - Sidelying Hip Abduction (Mirrored)  - 3 x daily - 7 x weekly - 2 sets - 10 reps  ASSESSMENT:  CLINICAL IMPRESSION: Session focus on LE strengthening, monitored pain through session.  Added retro walking for hip stability with some fatigue noted and progressed core and balance training with additional paloff exercise.  Pt cued through session to improve knee alignment and reduce hyperextension, tolerated well.  No reports of increased pain through session.  Discussed compliance with standing HEP, pt reports increased pain when completes too much, encouraged to continue with standing exercises for bone density but encouraged to reduce reps and sets and monitor accordingly.     OBJECTIVE  IMPAIRMENTS Abnormal gait, decreased activity tolerance, decreased balance, decreased endurance, decreased mobility, difficulty walking, decreased ROM, decreased strength, increased edema, increased fascial restrictions, increased muscle spasms, impaired flexibility, improper body mechanics, obesity, and pain.   ACTIVITY LIMITATIONS carrying, lifting, bending, standing, squatting, stairs, transfers, locomotion level, and caring for others  PARTICIPATION LIMITATIONS: meal prep, cleaning, laundry, shopping, community activity, and yard work  [place], Time since onset of injury/illness/exacerbation, and 3+ comorbidities: chronic B knee pain, R hip pain, back pain, obesity, HTN, DM  are also affecting patient's functional outcome.   REHAB POTENTIAL: Good  CLINICAL DECISION MAKING: Evolving/moderate complexity  EVALUATION COMPLEXITY: Moderate   GOALS: Goals reviewed with patient? Yes  SHORT TERM GOALS: Target date: 02/03/2022  Patient will be independent with HEP in order to improve functional outcomes. Baseline:  Goal status: MET  2.  Patient will report at least 25% improvement in symptoms for improved quality of life. Baseline:  Goal status: IN PROGRESS    LONG TERM GOALS: Target date: 02/24/2022  Patient will report at least 75% improvement in symptoms for improved quality of life. Baseline:  Goal status: IN PROGRESS  2.  Patient will improve LEFS score by at least 12 points in order to indicate improved tolerance to activity. Baseline: 36/80  Goal status: IN PROGRESS  3.  Patient will be able to navigate stairs with reciprocal pattern without compensation in order to demonstrate improved LE strength. Baseline: step too using RLE Goal status: IN PROGRESS  4.  Patient will be able to ambulate at least 300 feet in 2MWT in order to demonstrate improved tolerance to activity. Baseline: 230 feet Goal status: IN PROGRESS  5.  Patient will improve ROM for L knee  extension/flexion to 0-115 degrees to improve squatting, and other functional mobility. Baseline: 4 hyperextension to 104 Goal status: IN PROGRESS  6.  Patient will demonstrate grade of 5/5 MMT grade in all tested musculature as evidence of improved strength to assist with stair ambulation and gait.   Baseline: see MMT Goal status: IN PROGRESS    PLAN: PT FREQUENCY: 2x/week  PT DURATION: 6 weeks  PLANNED INTERVENTIONS: Therapeutic exercises, Therapeutic activity, Neuromuscular re-education, Balance training, Gait training, Patient/Family education, Joint manipulation, Joint mobilization, Stair training, Orthotic/Fit training, DME instructions, Aquatic Therapy, Dry Needling, Electrical stimulation, Spinal manipulation, Spinal mobilization, Cryotherapy, Moist heat, Compression bandaging, scar mobilization, Splintting, Taping, Traction, Ultrasound, Ionotophoresis 4mg /ml Dexamethasone, and Manual therapy   PLAN FOR NEXT SESSION: quad strength, functional strength, L quad motor control to avoid hyperextension, bilateral hip strength, gait and balance training  Ihor Austin, LPTA/CLT; Delana Meyer [phone]

## 2022-02-09 ENCOUNTER — Encounter (HOSPITAL_COMMUNITY): Payer: Medicaid Other | Admitting: Physical Therapy

## 2022-02-11 ENCOUNTER — Ambulatory Visit (HOSPITAL_COMMUNITY): Payer: Medicaid Other | Admitting: Physical Therapy

## 2022-02-11 DIAGNOSIS — R262 Difficulty in walking, not elsewhere classified: Secondary | ICD-10-CM

## 2022-02-11 DIAGNOSIS — M6281 Muscle weakness (generalized): Secondary | ICD-10-CM | POA: Diagnosis not present

## 2022-02-11 DIAGNOSIS — M25561 Pain in right knee: Secondary | ICD-10-CM | POA: Diagnosis not present

## 2022-02-11 DIAGNOSIS — M25562 Pain in left knee: Secondary | ICD-10-CM | POA: Diagnosis not present

## 2022-02-11 DIAGNOSIS — R2689 Other abnormalities of gait and mobility: Secondary | ICD-10-CM

## 2022-02-11 DIAGNOSIS — M79605 Pain in left leg: Secondary | ICD-10-CM

## 2022-02-11 DIAGNOSIS — G8929 Other chronic pain: Secondary | ICD-10-CM | POA: Diagnosis not present

## 2022-02-11 NOTE — Therapy (Signed)
OUTPATIENT PHYSICAL THERAPY LOWER EXTREMITY TREATMENT   Patient Name: [name] MRN: [id number] DOB:1986-01-12, 36 y.o., female Today's Date: 02/11/2022   PT End of Session - 02/11/22 1315     Visit Number 6    Number of Visits 12    Date for PT Re-Evaluation 02/24/22    Authorization Type Medicaid [employer]    Authorization Time Period Approved 16 visits (9/27-12/26)    Authorization - Visit Number 5    Authorization - Number of Visits 16    Progress Note Due on Visit 10    PT Start Time 1303    PT Stop Time 1345    PT Time Calculation (min) 42 min    Activity Tolerance Patient tolerated treatment well    Behavior During Therapy WFL for tasks assessed/performed               Past Medical History:  Diagnosis Date   Arthritis    Hypertension    Kidney infection    UTI (urinary tract infection)    Vaginal Pap smear, abnormal    Past Surgical History:  Procedure Laterality Date   BLADDER SURGERY     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   CESAREAN SECTION     COLPOSCOPY     Patient Active Problem List   Diagnosis Date Noted   Acquired deformity of parts of limb 08/24/2021   DM (diabetes mellitus) ([place]) 08/11/2021   HTN (hypertension) 08/11/2021   Osteoarthritis 08/11/2021   Frequent UTI 07/08/2021   History of exstrophy of bladder 07/08/2021   Incomplete bladder emptying 07/08/2021   Nephrolithiasis 07/08/2021   Prediabetes 06/15/2021   Morbid obesity ([place]) 08/25/2020   Essential hypertension, benign 11/08/2019   History of cesarean delivery 08/01/2019   BMI 50.0-59.9, adult ([place]) 08/01/2019   IUFD at less than 20 weeks of gestation 07/31/2019   Marijuana use 06/21/2019   Smoker 06/20/2019   Abnormal Pap smear of cervix 06/20/2019   Depression with anxiety 06/20/2019    PCP: Carrolyn Meiers MD  REFERRING PROVIDER: Val Eagle, MD   REFERRING DIAG: tibial deformity, acquired, left (A63.016)   THERAPY DIAG:  Muscle weakness  (generalized)  Difficulty in walking, not elsewhere classified  Other abnormalities of gait and mobility  Pain in left leg  Rationale for Evaluation and Treatment Rehabilitation  ONSET DATE: 10/08/2021   SUBJECTIVE:   SUBJECTIVE STATEMENT: Pt Came to clinic today walking without AD (last visit was rolled in). states she got shots in her Rt hip and knee and that has helped (10/17).  Still having pain in the LT knee, 3-4/10.    PERTINENT HISTORY: s/p L IMN Proximal tibial with osteotomy dos: 10/08/2021, chronic B knee pain, R hip pain, back pain, obesity   PAIN:  Are you having pain? Yes: NPRS scale: 3-4/10 Pain location: L shin/knee and Rt hip Pain description: discomfort Aggravating factors: weightbearing, walking Relieving factors: rest  PRECAUTIONS: None  WEIGHT BEARING RESTRICTIONS No  FALLS:  Has patient fallen in last 6 months? No  LIVING ENVIRONMENT: Lives with: lives with their family Lives in: Mobile home Stairs: Yes: External: 6 steps; can reach both Has following equipment at home: Single point cane, Walker - 2 wheeled, Wheelchair (manual), and bed side commode  OCCUPATION: [employer]  PLOF: Independent  PATIENT GOALS improve mobility   OBJECTIVE:   PATIENT SURVEYS:  LEFS 36/80  COGNITION:  Overall cognitive status: Within functional limits for tasks assessed     SENSATION:  WFL   POSTURE: rounded shoulders and forward head  PALPATION: No TTP  LOWER EXTREMITY ROM:  Active ROM Right eval Left eval  Hip flexion    Hip extension    Hip abduction    Hip adduction    Hip internal rotation    Hip external rotation    Knee flexion 115 104  Knee extension  4 hyperextension  Ankle dorsiflexion    Ankle plantarflexion    Ankle inversion    Ankle eversion     (Blank rows = not tested)  LOWER EXTREMITY MMT:  MMT Right eval Left eval  Hip flexion 4+ 4+  Hip extension 4+ 4+  Hip abduction 3+ 4  Hip adduction    Hip internal  rotation    Hip external rotation    Knee flexion 5 4  Knee extension 5 4-*  Ankle dorsiflexion 5 5  Ankle plantarflexion    Ankle inversion    Ankle eversion     (Blank rows = not tested)   FUNCTIONAL TESTS:  5 times sit to stand: 15.70 seconds 2 minute walk test: 230 feet  GAIT: Distance walked: 230 feet Assistive device utilized: None Level of assistance: Complete Independence Comments: 2MWT, antalgic, limited knee flexion/extension ROM bilateral, severe valgus fatigued during/following in LEs    TODAY'S TREATMENT: 02/11/22 Standing: Heel raises 20x 5"  Minisquat 20x front of chair  Hamstring curls 2# 10x 3" with HHA  Hip abduction 2# 2X10  Hip extension 2# 2X10  Vectors 10X5" with 1 UE assist  Pallof in partial tandem stance with GTB 15 reps each direction  02/04/22 Standing:  Heel raises 10x 5"   Sidestep 2RT RTB around thigh (12 ft)  Retro gait 1RT RTB around thigh (82ft)   Minisquat 10x front of chair   Hamstring curls 3# 10x 3" with HHA   Pallof in partial tandem stance with GTB 4x 10 reps   LAQ 3# 10x  01/28/22             Standing:             Rocker board x 2'             Heel raises x 10              Minisquat x 10              Semi tandem stance x 3 B              Slant board 30" x 2            Terminal knee extension 4 pl x 10 each              Side stepping red thera-band down long counter in front x 1 RT             Hip extension with red thera-band x 10 Each             Knee flexion with red thera-band x 10 each              Leg press 3 Pl x 15      01/18/22 Bridge 2 x 10 SLR x 10 each (Increased difficulty on LLE)  Sidelying hip abduction 2 x 10   Mini squat 2 x 10 Wall sit (approx 45 deg bend) 5 x 10" TKE 15 x 3" each 3 plates  Band sidestepping RTB 3 RT in bars    01/15/22 NuStep lvl 1 x5  min, warm-up, strength, ROM, while reviewing outcomes from initial visit. LAQ 2# 2x10 Standing HS curl 2# 2x10 Sidelying hip abduction  2x10 Bridge 2x10 SLR 2x10 Hooklying isometric hip abduction with ball 2x10 3" Hooklying BTB hip abduction 2x10 Mini squats with bil hand support 2x10 Semi-tandem stance fading hand support 2x30" ea Gait training with SPC, 100' demonstrating and teaching back SPC use, 2pt gait pattern.    01/13/22 LAQ x 10 5 second holds Bridge x 10 - educated for HEP Hip abduction x 10  - educated for HEP   PATIENT EDUCATION:  Education details: Patient educated on exam findings, POC, scope of PT, HEP, and avoiding hyperextension. Person educated: Patient Education method: Explanation, Demonstration, and Handouts Education comprehension: verbalized understanding, returned demonstration, verbal cues required, and tactile cues required  HOME EXERCISE PROGRAM:  01/28/22: tadem stance Access Code: [email or web address] Exercises - Standing Tandem Balance with Counter Support  - 1 x daily - 7 x weekly - 1 sets - 3 reps - as long as possible  hold Exercises - Standing Tandem Balance with Counter Support  - 1 x daily - 7 x weekly - 1 sets - 3 reps - as long as possible  hold 01/18/22 - Mini Squat with Counter Support  - 3 x daily - 7 x weekly - 2 sets - 10 reps - Heel Raises with Counter Support  - 3 x daily - 7 x weekly - 2 sets - 10 reps - Side Stepping with Resistance at Ankles and Counter Support  - 1 x daily - 7 x weekly - 1 sets - 10 reps  Date: 01/13/2022 - Seated Long Arc Quad (Mirrored)  - 3 x daily - 7 x weekly - 2 sets - 10 reps - 5 second hold - Supine Bridge  - 3 x daily - 7 x weekly - 2 sets - 10 reps - Sidelying Hip Abduction (Mirrored)  - 3 x daily - 7 x weekly - 2 sets - 10 reps  ASSESSMENT:  CLINICAL IMPRESSION: Continued focus on LE strengthening in standing.  Began hip abduction/extension with added 2# with cues for posturing.  Vector stance very challenging for pateint, especially with Lt LE stance. Pt only able to complete 5 reps on Lt but completed 2 sets of 5 on Rt LE.  Pt reported some  discomfort in lt shin region but limited by fatigue/weakness on Rt LE.  Continued with paloff to help improve core stability as well. Pt cued through session to improve knee alignment and reduce hyperextension, tolerated well.  Pt will continue to benefit from skilled therapy to improve strength and stability.    OBJECTIVE IMPAIRMENTS Abnormal gait, decreased activity tolerance, decreased balance, decreased endurance, decreased mobility, difficulty walking, decreased ROM, decreased strength, increased edema, increased fascial restrictions, increased muscle spasms, impaired flexibility, improper body mechanics, obesity, and pain.   ACTIVITY LIMITATIONS carrying, lifting, bending, standing, squatting, stairs, transfers, locomotion level, and caring for others  PARTICIPATION LIMITATIONS: meal prep, cleaning, laundry, shopping, community activity, and yard work  [place], Time since onset of injury/illness/exacerbation, and 3+ comorbidities: chronic B knee pain, R hip pain, back pain, obesity, HTN, DM  are also affecting patient's functional outcome.   REHAB POTENTIAL: Good  CLINICAL DECISION MAKING: Evolving/moderate complexity  EVALUATION COMPLEXITY: Moderate   GOALS: Goals reviewed with patient? Yes  SHORT TERM GOALS: Target date: 02/03/2022  Patient will be independent with HEP in order to improve functional outcomes. Baseline:  Goal status: MET  2.  Patient  will report at least 25% improvement in symptoms for improved quality of life. Baseline:  Goal status: IN PROGRESS    LONG TERM GOALS: Target date: 02/24/2022  Patient will report at least 75% improvement in symptoms for improved quality of life. Baseline:  Goal status: IN PROGRESS  2.  Patient will improve LEFS score by at least 12 points in order to indicate improved tolerance to activity. Baseline: 36/80 Goal status: IN PROGRESS  3.  Patient will be able to navigate stairs with reciprocal pattern  without compensation in order to demonstrate improved LE strength. Baseline: step too using RLE Goal status: IN PROGRESS  4.  Patient will be able to ambulate at least 300 feet in 2MWT in order to demonstrate improved tolerance to activity. Baseline: 230 feet Goal status: IN PROGRESS  5.  Patient will improve ROM for L knee extension/flexion to 0-115 degrees to improve squatting, and other functional mobility. Baseline: 4 hyperextension to 104 Goal status: IN PROGRESS  6.  Patient will demonstrate grade of 5/5 MMT grade in all tested musculature as evidence of improved strength to assist with stair ambulation and gait.   Baseline: see MMT Goal status: IN PROGRESS    PLAN: PT FREQUENCY: 2x/week  PT DURATION: 6 weeks  PLANNED INTERVENTIONS: Therapeutic exercises, Therapeutic activity, Neuromuscular re-education, Balance training, Gait training, Patient/Family education, Joint manipulation, Joint mobilization, Stair training, Orthotic/Fit training, DME instructions, Aquatic Therapy, Dry Needling, Electrical stimulation, Spinal manipulation, Spinal mobilization, Cryotherapy, Moist heat, Compression bandaging, scar mobilization, Splintting, Taping, Traction, Ultrasound, Ionotophoresis 22m/ml Dexamethasone, and Manual therapy   PLAN FOR NEXT SESSION: quad strength, functional strength, L quad motor control to avoid hyperextension, bilateral hip strength, gait and balance training  ATeena Irani PTA/CLT CTuskegeePh: [phone]  [phone]

## 2022-02-15 ENCOUNTER — Encounter (HOSPITAL_COMMUNITY): Payer: Medicaid Other | Admitting: Physical Therapy

## 2022-02-18 ENCOUNTER — Ambulatory Visit (HOSPITAL_COMMUNITY): Payer: Medicaid Other | Attending: Internal Medicine | Admitting: Physical Therapy

## 2022-02-18 DIAGNOSIS — R262 Difficulty in walking, not elsewhere classified: Secondary | ICD-10-CM | POA: Diagnosis not present

## 2022-02-18 DIAGNOSIS — M79605 Pain in left leg: Secondary | ICD-10-CM | POA: Diagnosis not present

## 2022-02-18 DIAGNOSIS — G8929 Other chronic pain: Secondary | ICD-10-CM | POA: Insufficient documentation

## 2022-02-18 DIAGNOSIS — R2689 Other abnormalities of gait and mobility: Secondary | ICD-10-CM | POA: Diagnosis not present

## 2022-02-18 DIAGNOSIS — M25562 Pain in left knee: Secondary | ICD-10-CM | POA: Diagnosis not present

## 2022-02-18 DIAGNOSIS — M6281 Muscle weakness (generalized): Secondary | ICD-10-CM | POA: Diagnosis not present

## 2022-02-18 DIAGNOSIS — M545 Low back pain, unspecified: Secondary | ICD-10-CM | POA: Diagnosis not present

## 2022-02-18 DIAGNOSIS — M25561 Pain in right knee: Secondary | ICD-10-CM | POA: Diagnosis not present

## 2022-02-18 NOTE — Therapy (Signed)
OUTPATIENT PHYSICAL THERAPY LOWER EXTREMITY TREATMENT   Patient Name: [name] MRN: [id number] DOB:11/30/1985, 36 y.o., female Today's Date: 02/18/2022  Progress Note Reporting Period 01/13/22 to 02/18/22  See note below for Objective Data and Assessment of Progress/Goals.      PT End of Session - 02/18/22 1305     Visit Number 7    Number of Visits 15    Date for PT Re-Evaluation 03/18/22    Authorization Type Medicaid [employer]    Authorization Time Period Approved 16 visits (9/27-12/26)    Authorization - Visit Number 6    Authorization - Number of Visits 16    Progress Note Due on Visit 10    PT Start Time 1303    PT Stop Time 1343    PT Time Calculation (min) 40 min    Activity Tolerance Patient tolerated treatment well    Behavior During Therapy WFL for tasks assessed/performed               Past Medical History:  Diagnosis Date   Arthritis    Hypertension    Kidney infection    UTI (urinary tract infection)    Vaginal Pap smear, abnormal    Past Surgical History:  Procedure Laterality Date   BLADDER SURGERY     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   CESAREAN SECTION     COLPOSCOPY     Patient Active Problem List   Diagnosis Date Noted   Acquired deformity of parts of limb 08/24/2021   DM (diabetes mellitus) ([place]) 08/11/2021   HTN (hypertension) 08/11/2021   Osteoarthritis 08/11/2021   Frequent UTI 07/08/2021   History of exstrophy of bladder 07/08/2021   Incomplete bladder emptying 07/08/2021   Nephrolithiasis 07/08/2021   Prediabetes 06/15/2021   Morbid obesity ([place]) 08/25/2020   Essential hypertension, benign 11/08/2019   History of cesarean delivery 08/01/2019   BMI 50.0-59.9, adult ([place]) 08/01/2019   IUFD at less than 20 weeks of gestation 07/31/2019   Marijuana use 06/21/2019   Smoker 06/20/2019   Abnormal Pap smear of cervix 06/20/2019   Depression with anxiety 06/20/2019    PCP: Carrolyn Meiers MD  REFERRING  PROVIDER: Val Eagle, MD   REFERRING DIAG: tibial deformity, acquired, 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 Had shots recenlty which did help some. She has pain in WB and sometimes is sharp/ stinging. Thinks she may have shin splints.   PERTINENT HISTORY: s/p L IMN Proximal tibial with osteotomy dos: 10/08/2021, chronic B knee pain, R hip pain, back pain, obesity   PAIN:  Are you having pain? Yes: NPRS scale: 2-3/10 Pain location: L shin/knee and Rt hip Pain description: discomfort Aggravating factors: weightbearing, walking Relieving factors: rest  PRECAUTIONS: None  WEIGHT BEARING RESTRICTIONS No  FALLS:  Has patient fallen in last 6 months? No  LIVING ENVIRONMENT: Lives with: lives with their family Lives in: Mobile home Stairs: Yes: External: 6 steps; can reach both Has following equipment at home: Single point cane, Walker - 2 wheeled, Wheelchair (manual), and bed side commode  OCCUPATION: [employer]  PLOF: Independent  PATIENT GOALS improve mobility   OBJECTIVE:   PATIENT SURVEYS:  LEFS 25/80 (was 36/80)  COGNITION:  Overall cognitive status: Within functional limits  for tasks assessed     SENSATION: WFL   POSTURE: rounded shoulders and forward head  PALPATION: No TTP  LOWER EXTREMITY ROM:  Active ROM Right eval Left eval Left  02/18/22  Hip flexion     Hip extension     Hip abduction     Hip adduction     Hip internal rotation     Hip external rotation     Knee flexion 115 104 115  Knee extension  4 hyperextension 0  Ankle dorsiflexion     Ankle plantarflexion     Ankle inversion     Ankle eversion      (Blank rows = not tested)  LOWER EXTREMITY MMT:  MMT Right eval  Left eval Right 02/18/22 Left 02/18/22  Hip flexion 4+ 4+ 4+ 4+  Hip extension 4+ 4+ 4 4+  Hip abduction 3+ 4 4- 4  Hip adduction      Hip internal rotation      Hip external rotation      Knee flexion _0 4+  Knee extension 5 4-* 5 5  Ankle dorsiflexion _1 Ankle plantarflexion      Ankle inversion      Ankle eversion       (Blank rows = not tested)   FUNCTIONAL TESTS:  5 times sit to stand: 15.70 seconds 2 minute walk test: 265 feet (was 230 feet)  GAIT: Distance walked: 265 feet Assistive device utilized: None Level of assistance: Complete Independence Comments: 2MWT, antalgic, limited knee flexion/extension ROM bilateral, severe valgus fatigued during/following in LEs    TODAY'S TREATMENT: 02/18/22 Reassess LEFS MMT AROM 2 MWT  Stairs (1 flight up/ down) hand rails x 2  02/11/22 Standing: Heel raises 20x 5"  Minisquat 20x front of chair  Hamstring curls 2# 10x 3" with HHA  Hip abduction 2# 2X10  Hip extension 2# 2X10  Vectors 10X5" with 1 UE assist  Pallof in partial tandem stance with GTB 15 reps each direction  02/04/22 Standing:  Heel raises 10x 5"   Sidestep 2RT RTB around thigh (12 ft)  Retro gait 1RT RTB around thigh (63f)   Minisquat 10x front of chair   Hamstring curls 3# 10x 3" with HHA   Pallof in partial tandem stance with GTB 4x 10 reps   LAQ 3# 10x  01/28/22             Standing:             Rocker board x 2'             Heel raises x 10              Minisquat x 10              Semi tandem stance x 3 B              Slant board 30" x 2            Terminal knee extension 4 pl x 10 each              Side stepping red thera-band down long counter in front x 1 RT             Hip extension with red thera-band x 10 Each             Knee flexion with red thera-band x 10 each  Leg press 3 Pl x 15   PATIENT EDUCATION:  Education details: Patient educated on exam findings, POC, scope of PT, HEP, and avoiding  hyperextension. Person educated: Patient Education method: Explanation, Demonstration, and Handouts Education comprehension: verbalized understanding, returned demonstration, verbal cues required, and tactile cues required  HOME EXERCISE PROGRAM:  01/28/22: tadem stance Access Code: [email or web address] Exercises - Standing Tandem Balance with Counter Support  - 1 x daily - 7 x weekly - 1 sets - 3 reps - as long as possible  hold Exercises - Standing Tandem Balance with Counter Support  - 1 x daily - 7 x weekly - 1 sets - 3 reps - as long as possible  hold 01/18/22 - Mini Squat with Counter Support  - 3 x daily - 7 x weekly - 2 sets - 10 reps - Heel Raises with Counter Support  - 3 x daily - 7 x weekly - 2 sets - 10 reps - Side Stepping with Resistance at Ankles and Counter Support  - 1 x daily - 7 x weekly - 1 sets - 10 reps  Date: 01/13/2022 - Seated Long Arc Quad (Mirrored)  - 3 x daily - 7 x weekly - 2 sets - 10 reps - 5 second hold - Supine Bridge  - 3 x daily - 7 x weekly - 2 sets - 10 reps - Sidelying Hip Abduction (Mirrored)  - 3 x daily - 7 x weekly - 2 sets - 10 reps  ASSESSMENT:  CLINICAL IMPRESSION: Patient making steady progress to therapy goals. Showing improved muscle strength and knee AROM. Ongoing weakness, balance and gait deficits which continue to contribute to pain and are limiting functional ability. Patient would continue to benefit from ongoing skilled therapy services to address these remaining deficits to reduce pain and improve LOF with ADLs and functional mobility.   OBJECTIVE IMPAIRMENTS Abnormal gait, decreased activity tolerance, decreased balance, decreased endurance, decreased mobility, difficulty walking, decreased ROM, decreased strength, increased edema, increased fascial restrictions, increased muscle spasms, impaired flexibility, improper body mechanics, obesity, and pain.   ACTIVITY LIMITATIONS carrying, lifting, bending, standing, squatting, stairs, transfers,  locomotion level, and caring for others  PARTICIPATION LIMITATIONS: meal prep, cleaning, laundry, shopping, community activity, and yard work  [place], Time since onset of injury/illness/exacerbation, and 3+ comorbidities: chronic B knee pain, R hip pain, back pain, obesity, HTN, DM  are also affecting patient's functional outcome.   REHAB POTENTIAL: Good  CLINICAL DECISION MAKING: Evolving/moderate complexity  EVALUATION COMPLEXITY: Moderate   GOALS: Goals reviewed with patient? Yes  SHORT TERM GOALS: Target date: 02/03/2022  Patient will be independent with HEP in order to improve functional outcomes. Baseline:  Goal status: MET  2.  Patient will report at least 25% improvement in symptoms for improved quality of life. Baseline: 50% Goal status: MET    LONG TERM GOALS: Target date: 02/24/2022  Patient will report at least 75% improvement in symptoms for improved quality of life. Baseline: at least 50% better Goal status: IN PROGRESS  2.  Patient will improve LEFS score by at least 12 points in order to indicate improved tolerance to activity. Baseline: 25/80 (decreased 11 points) Goal status: IN PROGRESS  3.  Patient will be able to navigate stairs with reciprocal pattern without compensation in order to demonstrate improved LE strength. Baseline: Can do reciprocal with bilat hand rails, but decreased control/ stability with descending  Goal status: IN PROGRESS  4.  Patient will be able to ambulate at least  300 feet in 2MWT in order to demonstrate improved tolerance to activity. Baseline: 265 feet Goal status: IN PROGRESS  5.  Patient will improve ROM for L knee extension/flexion to 0-115 degrees to improve squatting, and other functional mobility. Baseline: 0-115 Goal status: MET  6.  Patient will demonstrate grade of 5/5 MMT grade in all tested musculature as evidence of improved strength to assist with stair ambulation and gait.   Baseline: see  MMT Goal status: IN PROGRESS    PLAN: PT FREQUENCY: 2x/week  PT DURATION: 4 weeks  PLANNED INTERVENTIONS: Therapeutic exercises, Therapeutic activity, Neuromuscular re-education, Balance training, Gait training, Patient/Family education, Joint manipulation, Joint mobilization, Stair training, Orthotic/Fit training, DME instructions, Aquatic Therapy, Dry Needling, Electrical stimulation, Spinal manipulation, Spinal mobilization, Cryotherapy, Moist heat, Compression bandaging, scar mobilization, Splintting, Taping, Traction, Ultrasound, Ionotophoresis 50m/ml Dexamethasone, and Manual therapy   PLAN FOR NEXT SESSION: quad strength, functional strength, L quad motor control to avoid hyperextension, bilateral hip strength, gait and balance training  1:41 PM, 02/18/22 CJosue HectorPT DPT  [facility] ([phone]

## 2022-02-22 ENCOUNTER — Encounter (HOSPITAL_COMMUNITY): Payer: Medicaid Other

## 2022-02-24 ENCOUNTER — Ambulatory Visit (HOSPITAL_COMMUNITY): Payer: Medicaid Other

## 2022-02-24 ENCOUNTER — Encounter (HOSPITAL_COMMUNITY): Payer: Self-pay

## 2022-02-24 DIAGNOSIS — M545 Low back pain, unspecified: Secondary | ICD-10-CM | POA: Diagnosis not present

## 2022-02-24 DIAGNOSIS — R262 Difficulty in walking, not elsewhere classified: Secondary | ICD-10-CM | POA: Diagnosis not present

## 2022-02-24 DIAGNOSIS — M79605 Pain in left leg: Secondary | ICD-10-CM | POA: Diagnosis not present

## 2022-02-24 DIAGNOSIS — R2689 Other abnormalities of gait and mobility: Secondary | ICD-10-CM

## 2022-02-24 DIAGNOSIS — G8929 Other chronic pain: Secondary | ICD-10-CM | POA: Diagnosis not present

## 2022-02-24 DIAGNOSIS — M25561 Pain in right knee: Secondary | ICD-10-CM | POA: Diagnosis not present

## 2022-02-24 DIAGNOSIS — M6281 Muscle weakness (generalized): Secondary | ICD-10-CM

## 2022-02-24 DIAGNOSIS — M25562 Pain in left knee: Secondary | ICD-10-CM | POA: Diagnosis not present

## 2022-02-24 NOTE — Therapy (Signed)
OUTPATIENT PHYSICAL THERAPY LOWER EXTREMITY TREATMENT   Patient Name: [name] MRN: [id number] DOB:1986/02/01, 36 y.o., female Today's Date: 02/24/2022     PT End of Session - 02/24/22 0920     Visit Number 8    Number of Visits 15    Date for PT Re-Evaluation 03/18/22    Authorization Type Medicaid [employer]    Authorization Time Period Approved 16 visits (9/27-12/26)    Authorization - Visit Number 8    Authorization - Number of Visits 16    PT Start Time 0818    PT Stop Time 0900    PT Time Calculation (min) 42 min    Activity Tolerance Patient tolerated treatment well    Behavior During Therapy WFL for tasks assessed/performed                Past Medical History:  Diagnosis Date   Arthritis    Hypertension    Kidney infection    UTI (urinary tract infection)    Vaginal Pap smear, abnormal    Past Surgical History:  Procedure Laterality Date   BLADDER SURGERY     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   CESAREAN SECTION     COLPOSCOPY     Patient Active Problem List   Diagnosis Date Noted   Acquired deformity of parts of limb 08/24/2021   DM (diabetes mellitus) ([place]) 08/11/2021   HTN (hypertension) 08/11/2021   Osteoarthritis 08/11/2021   Frequent UTI 07/08/2021   History of exstrophy of bladder 07/08/2021   Incomplete bladder emptying 07/08/2021   Nephrolithiasis 07/08/2021   Prediabetes 06/15/2021   Morbid obesity ([place]) 08/25/2020   Essential hypertension, benign 11/08/2019   History of cesarean delivery 08/01/2019   BMI 50.0-59.9, adult ([place]) 08/01/2019   IUFD at less than 20 weeks of gestation 07/31/2019   Marijuana use 06/21/2019   Smoker 06/20/2019   Abnormal Pap smear of cervix 06/20/2019   Depression with anxiety 06/20/2019    PCP: Carrolyn Meiers MD  REFERRING PROVIDER: Val Eagle, MD  Next apt 05/04/21  REFERRING DIAG: tibial deformity, acquired, left (L89.373)   THERAPY DIAG:  Muscle weakness  (generalized)  Difficulty in walking, not elsewhere classified  Pain in left leg  Other abnormalities of gait and mobility  Chronic pain of both knees  Rationale for Evaluation and Treatment Rehabilitation  ONSET DATE: 10/08/2021   SUBJECTIVE:   SUBJECTIVE STATEMENT: Pt stated the shot seems to be wearing off after 3 weeks, has another apt later this month (03/16/22 MD Luna Glasgow).  Pain scale 5/10 tenderness, stiff and dull pain Bil knees and Rt hip.  Stated she is sore following exercises at home.     PERTINENT HISTORY: s/p L IMN Proximal tibial with osteotomy dos: 10/08/2021, chronic B knee pain, R hip pain, back pain, obesity   PAIN:  Are you having pain? Yes: NPRS scale: 2-3/10 Pain location: L shin/knee and Rt hip Pain description: discomfort Aggravating factors: weightbearing, walking Relieving factors: rest  PRECAUTIONS: None  WEIGHT BEARING RESTRICTIONS No  FALLS:  Has patient fallen in last 6 months? No  LIVING ENVIRONMENT: Lives with: lives with their family Lives in: Mobile home Stairs: Yes: External: 6 steps; can reach both Has following equipment at home: Single point cane, Walker - 2 wheeled, Wheelchair (manual), and bed side commode  OCCUPATION: [employer]  PLOF: Independent  PATIENT GOALS improve mobility   OBJECTIVE:   PATIENT SURVEYS:  LEFS 25/80 (was 36/80)  COGNITION:  Overall  cognitive status: Within functional limits for tasks assessed     SENSATION: WFL   POSTURE: rounded shoulders and forward head  PALPATION: No TTP  LOWER EXTREMITY ROM:  Active ROM Right eval Left eval Left  02/18/22  Hip flexion     Hip extension     Hip abduction     Hip adduction     Hip internal rotation     Hip external rotation     Knee flexion 115 104 115  Knee extension  4 hyperextension 0  Ankle dorsiflexion     Ankle plantarflexion     Ankle inversion     Ankle eversion      (Blank rows = not tested)  LOWER EXTREMITY MMT:  MMT  Right eval Left eval Right 02/18/22 Left 02/18/22  Hip flexion 4+ 4+ 4+ 4+  Hip extension 4+ 4+ 4 4+  Hip abduction 3+ 4 4- 4  Hip adduction      Hip internal rotation      Hip external rotation      Knee flexion _0 4+  Knee extension 5 4-* 5 5  Ankle dorsiflexion _1 Ankle plantarflexion      Ankle inversion      Ankle eversion       (Blank rows = not tested)   FUNCTIONAL TESTS:  5 times sit to stand: 15.70 seconds 2 minute walk test: 265 feet (was 230 feet)  GAIT: Distance walked: 265 feet Assistive device utilized: None Level of assistance: Complete Independence Comments: 2MWT, antalgic, limited knee flexion/extension ROM bilateral, severe valgus fatigued during/following in LEs    TODAY'S TREATMENT: 02/24/22 Standing: heel raise in mini squat position 2x 10  Squat with RTB around thigh  Seated: arch formation  Towel crunch   Great toe extension Standing arch formation 4in step up with cueing to reduce hyperextension 2x 10 4in step down with 1 HR A Sidestep minisquat position RTB around thighs 2RT in hallway  Seated: LAQ with IR then ER 10x BLE 02/18/22 Reassess LEFS MMT AROM 2 MWT  Stairs (1 flight up/ down) hand rails x 2  02/11/22 Standing: Heel raises 20x 5"  Minisquat 20x front of chair  Hamstring curls 2# 10x 3" with HHA  Hip abduction 2# 2X10  Hip extension 2# 2X10  Vectors 10X5" with 1 UE assist  Pallof in partial tandem stance with GTB 15 reps each direction  02/04/22 Standing:  Heel raises 10x 5"   Sidestep 2RT RTB around thigh (12 ft)  Retro gait 1RT RTB around thigh (10f)   Minisquat 10x front of chair   Hamstring curls 3# 10x 3" with HHA   Pallof in partial tandem stance with GTB 4x 10 reps   LAQ 3# 10x  01/28/22             Standing:             Rocker board x 2'             Heel raises x 10              Minisquat x 10              Semi tandem stance x 3 B              Slant board 30" x 2            Terminal knee  extension 4 pl x 10 each  Side stepping red thera-band down long counter in front x 1 RT             Hip extension with red thera-band x 10 Each             Knee flexion with red thera-band x 10 each              Leg press 3 Pl x 15   PATIENT EDUCATION:  Education details: Patient educated on exam findings, POC, scope of PT, HEP, and avoiding hyperextension. Person educated: Patient Education method: Explanation, Demonstration, and Handouts Education comprehension: verbalized understanding, returned demonstration, verbal cues required, and tactile cues required  HOME EXERCISE PROGRAM: 02/24/22: arch formation exercises 01/28/22: tadem stance Access Code: [email or web address] Exercises - Standing Tandem Balance with Counter Support  - 1 x daily - 7 x weekly - 1 sets - 3 reps - as long as possible  hold Exercises - Standing Tandem Balance with Counter Support  - 1 x daily - 7 x weekly - 1 sets - 3 reps - as long as possible  hold 01/18/22 - Mini Squat with Counter Support  - 3 x daily - 7 x weekly - 2 sets - 10 reps - Heel Raises with Counter Support  - 3 x daily - 7 x weekly - 2 sets - 10 reps - Side Stepping with Resistance at Ankles and Counter Support  - 1 x daily - 7 x weekly - 1 sets - 10 reps  Date: 01/13/2022 - Seated Long Arc Quad (Mirrored)  - 3 x daily - 7 x weekly - 2 sets - 10 reps - 5 second hold - Supine Bridge  - 3 x daily - 7 x weekly - 2 sets - 10 reps - Sidelying Hip Abduction (Mirrored)  - 3 x daily - 7 x weekly - 2 sets - 10 reps  ASSESSMENT:  CLINICAL IMPRESSION: Continued session focus with LE strengthening and education on improving alignment for pain control.  Verbal and tactile cueing to improve awareness of hyperextension during standing exercises.  Noted crepitus Rt knee with step up/down exercises.  Noted significant valgus with flat feet, educated importance of intrinsic strengthening to assist with knee alignment, add towel crunch and arch formation to  HEP.    Patient making steady progress to therapy goals. Showing improved muscle strength and knee AROM. Ongoing weakness, balance and gait deficits which continue to contribute to pain and are limiting functional ability. Patient would continue to benefit from ongoing skilled therapy services to address these remaining deficits to reduce pain and improve LOF with ADLs and functional mobility.   OBJECTIVE IMPAIRMENTS Abnormal gait, decreased activity tolerance, decreased balance, decreased endurance, decreased mobility, difficulty walking, decreased ROM, decreased strength, increased edema, increased fascial restrictions, increased muscle spasms, impaired flexibility, improper body mechanics, obesity, and pain.   ACTIVITY LIMITATIONS carrying, lifting, bending, standing, squatting, stairs, transfers, locomotion level, and caring for others  PARTICIPATION LIMITATIONS: meal prep, cleaning, laundry, shopping, community activity, and yard work  [place], Time since onset of injury/illness/exacerbation, and 3+ comorbidities: chronic B knee pain, R hip pain, back pain, obesity, HTN, DM  are also affecting patient's functional outcome.   REHAB POTENTIAL: Good  CLINICAL DECISION MAKING: Evolving/moderate complexity  EVALUATION COMPLEXITY: Moderate   GOALS: Goals reviewed with patient? Yes  SHORT TERM GOALS: Target date: 02/03/2022  Patient will be independent with HEP in order to improve functional outcomes. Baseline:  Goal status: MET  2.  Patient will report  at least 25% improvement in symptoms for improved quality of life. Baseline: 50% Goal status: MET    LONG TERM GOALS: Target date: 02/24/2022  Patient will report at least 75% improvement in symptoms for improved quality of life. Baseline: at least 50% better Goal status: IN PROGRESS  2.  Patient will improve LEFS score by at least 12 points in order to indicate improved tolerance to activity. Baseline: 25/80  (decreased 11 points) Goal status: IN PROGRESS  3.  Patient will be able to navigate stairs with reciprocal pattern without compensation in order to demonstrate improved LE strength. Baseline: Can do reciprocal with bilat hand rails, but decreased control/ stability with descending  Goal status: IN PROGRESS  4.  Patient will be able to ambulate at least 300 feet in 2MWT in order to demonstrate improved tolerance to activity. Baseline: 265 feet Goal status: IN PROGRESS  5.  Patient will improve ROM for L knee extension/flexion to 0-115 degrees to improve squatting, and other functional mobility. Baseline: 0-115 Goal status: MET  6.  Patient will demonstrate grade of 5/5 MMT grade in all tested musculature as evidence of improved strength to assist with stair ambulation and gait.   Baseline: see MMT Goal status: IN PROGRESS    PLAN: PT FREQUENCY: 2x/week  PT DURATION: 4 weeks  PLANNED INTERVENTIONS: Therapeutic exercises, Therapeutic activity, Neuromuscular re-education, Balance training, Gait training, Patient/Family education, Joint manipulation, Joint mobilization, Stair training, Orthotic/Fit training, DME instructions, Aquatic Therapy, Dry Needling, Electrical stimulation, Spinal manipulation, Spinal mobilization, Cryotherapy, Moist heat, Compression bandaging, scar mobilization, Splintting, Taping, Traction, Ultrasound, Ionotophoresis 72m/ml Dexamethasone, and Manual therapy   PLAN FOR NEXT SESSION: quad strength, functional strength, L quad motor control to avoid hyperextension, bilateral hip strength, gait and balance training  CIhor Austin LPTA/CLT; CBIS [phone] 9:21 AM, 02/24/22

## 2022-03-03 ENCOUNTER — Telehealth (HOSPITAL_COMMUNITY): Payer: Self-pay

## 2022-03-03 ENCOUNTER — Encounter (HOSPITAL_COMMUNITY): Payer: Medicaid Other

## 2022-03-03 NOTE — Telephone Encounter (Signed)
No show, attempted to call with no answer and mailbox is full, unable to leave message concerning missed apt today.   Becky Sax, LPTA/CLT; [place] [phone]

## 2022-03-05 ENCOUNTER — Encounter (HOSPITAL_COMMUNITY): Payer: Medicaid Other

## 2022-03-09 ENCOUNTER — Telehealth (HOSPITAL_COMMUNITY): Payer: Self-pay | Admitting: Physical Therapy

## 2022-03-09 ENCOUNTER — Telehealth: Payer: Self-pay | Admitting: Orthopaedic Surgery

## 2022-03-09 ENCOUNTER — Encounter (HOSPITAL_COMMUNITY): Payer: Medicaid Other | Admitting: Physical Therapy

## 2022-03-09 MED ORDER — HYDROCODONE-ACETAMINOPHEN 5-325 MG PO TABS: ORAL_TABLET | ORAL | 0 refills | Status: DC

## 2022-03-09 NOTE — Telephone Encounter (Signed)
Pt did not show for appt.  This is patients second NS.  Explained NS policy in voicemail and informed pt of next appt.  Lurena Nida, PTA/CLT [facility] Ph: [phone]

## 2022-03-16 ENCOUNTER — Ambulatory Visit (HOSPITAL_COMMUNITY): Payer: Medicaid Other | Admitting: Physical Therapy

## 2022-03-16 ENCOUNTER — Ambulatory Visit (INDEPENDENT_AMBULATORY_CARE_PROVIDER_SITE_OTHER): Payer: Medicaid Other | Admitting: Orthopaedic Surgery

## 2022-03-16 ENCOUNTER — Encounter: Payer: Self-pay | Admitting: Orthopaedic Surgery

## 2022-03-16 DIAGNOSIS — M25562 Pain in left knee: Secondary | ICD-10-CM | POA: Diagnosis not present

## 2022-03-16 DIAGNOSIS — M25561 Pain in right knee: Secondary | ICD-10-CM | POA: Diagnosis not present

## 2022-03-16 DIAGNOSIS — G8929 Other chronic pain: Secondary | ICD-10-CM | POA: Diagnosis not present

## 2022-03-16 DIAGNOSIS — M6281 Muscle weakness (generalized): Secondary | ICD-10-CM | POA: Diagnosis not present

## 2022-03-16 DIAGNOSIS — M79605 Pain in left leg: Secondary | ICD-10-CM | POA: Diagnosis not present

## 2022-03-16 DIAGNOSIS — R262 Difficulty in walking, not elsewhere classified: Secondary | ICD-10-CM

## 2022-03-16 DIAGNOSIS — M7061 Trochanteric bursitis, right hip: Secondary | ICD-10-CM

## 2022-03-16 DIAGNOSIS — R2689 Other abnormalities of gait and mobility: Secondary | ICD-10-CM | POA: Diagnosis not present

## 2022-03-16 DIAGNOSIS — M545 Low back pain, unspecified: Secondary | ICD-10-CM | POA: Diagnosis not present

## 2022-03-16 MED ORDER — METHYLPREDNISOLONE ACETATE 40 MG/ML IJ SUSP
40.0000 mg | Freq: Once | INTRAMUSCULAR | Status: AC
  Administered 2022-03-16: 40 mg via INTRA_ARTICULAR

## 2022-03-16 MED ORDER — CYCLOBENZAPRINE HCL 10 MG PO TABS: 10.0000 mg | ORAL_TABLET | Freq: Every day | ORAL | 1 refills | Status: DC

## 2022-03-16 NOTE — Therapy (Signed)
OUTPATIENT PHYSICAL THERAPY LOWER EXTREMITY TREATMENT   Patient Name: MIKEISHA LEMONDS MRN: [id number] DOB:1985-09-26, 36 y.o., female Today's Date: 03/16/2022     PT End of Session - 03/16/22 1305     Visit Number 9    Number of Visits 15    Date for PT Re-Evaluation 03/18/22    Authorization Type Medicaid [employer]    Authorization Time Period Approved 16 visits (9/27-12/26)    Authorization - Number of Visits 16    PT Start Time 1302    PT Stop Time 1345    PT Time Calculation (min) 43 min    Activity Tolerance Patient tolerated treatment well    Behavior During Therapy WFL for tasks assessed/performed                Past Medical History:  Diagnosis Date   Arthritis    Hypertension    Kidney infection    UTI (urinary tract infection)    Vaginal Pap smear, abnormal    Past Surgical History:  Procedure Laterality Date   BLADDER SURGERY     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   CESAREAN SECTION     COLPOSCOPY     Patient Active Problem List   Diagnosis Date Noted   Acquired deformity of parts of limb 08/24/2021   DM (diabetes mellitus) ([place]) 08/11/2021   HTN (hypertension) 08/11/2021   Osteoarthritis 08/11/2021   Frequent UTI 07/08/2021   History of exstrophy of bladder 07/08/2021   Incomplete bladder emptying 07/08/2021   Nephrolithiasis 07/08/2021   Prediabetes 06/15/2021   Morbid obesity ([place]) 08/25/2020   Essential hypertension, benign 11/08/2019   History of cesarean delivery 08/01/2019   BMI 50.0-59.9, adult ([place]) 08/01/2019   IUFD at less than 20 weeks of gestation 07/31/2019   Marijuana use 06/21/2019   Smoker 06/20/2019   Abnormal Pap smear of cervix 06/20/2019   Depression with anxiety 06/20/2019    PCP: Carrolyn Meiers MD  REFERRING PROVIDER: Val Eagle, MD  Next apt 05/04/21  REFERRING DIAG: tibial deformity, acquired, left (Q94.503)   THERAPY DIAG:  Muscle weakness (generalized)  Difficulty in walking, not  elsewhere classified  Pain in left leg  Other abnormalities of gait and mobility  Rationale for Evaluation and Treatment Rehabilitation  ONSET DATE: 10/08/2021   SUBJECTIVE:   SUBJECTIVE STATEMENT: Pt states she went to Dr Luna Glasgow this morning and got a shot in her Rt knee this morning.  States her rt is not hurting currently but her Lt lower leg is hurting 5/10 at rest, 7/10 with weight bearing.  States her hips also bother her.    States she is working 4-5 hours 2X a week at her job currently.  PERTINENT HISTORY: s/p L IMN Proximal tibial with osteotomy dos: 10/08/2021, chronic B knee pain, R hip pain, back pain, obesity   PAIN:  Are you having pain? Yes: NPRS scale: 2-3/10 Pain location: L shin/knee and Rt hip Pain description: discomfort Aggravating factors: weightbearing, walking Relieving factors: rest  PRECAUTIONS: None  WEIGHT BEARING RESTRICTIONS No  FALLS:  Has patient fallen in last 6 months? No  LIVING ENVIRONMENT: Lives with: lives with their family Lives in: Mobile home Stairs: Yes: External: 6 steps; can reach both Has following equipment at home: Single point cane, Walker - 2 wheeled, Wheelchair (manual), and bed side commode  OCCUPATION: [employer]  PLOF: Independent  PATIENT GOALS improve mobility   OBJECTIVE:   PATIENT SURVEYS:  LEFS 25/80 (was 36/80)  COGNITION:  Overall cognitive status: Within functional limits for tasks assessed     SENSATION: WFL   POSTURE: rounded shoulders and forward head  PALPATION: No TTP  LOWER EXTREMITY ROM:  Active ROM Right eval Left eval Left  02/18/22  Hip flexion     Hip extension     Hip abduction     Hip adduction     Hip internal rotation     Hip external rotation     Knee flexion 115 104 115  Knee extension  4 hyperextension 0  Ankle dorsiflexion     Ankle plantarflexion     Ankle inversion     Ankle eversion      (Blank rows = not tested)  LOWER EXTREMITY MMT:  MMT  Right eval Left eval Right 02/18/22 Left 02/18/22  Hip flexion 4+ 4+ 4+ 4+  Hip extension 4+ 4+ 4 4+  Hip abduction 3+ 4 4- 4  Hip adduction      Hip internal rotation      Hip external rotation      Knee flexion _0 4+  Knee extension 5 4-* 5 5  Ankle dorsiflexion _1 Ankle plantarflexion      Ankle inversion      Ankle eversion       (Blank rows = not tested)   FUNCTIONAL TESTS:  5 times sit to stand: 15.70 seconds 2 minute walk test: 265 feet (was 230 feet)  GAIT: Distance walked: 265 feet Assistive device utilized: None Level of assistance: Complete Independence Comments: 2MWT, antalgic, limited knee flexion/extension ROM bilateral, severe valgus fatigued during/following in LEs    TODAY'S TREATMENT: 03/16/22 Standing: Heelraises 15X  Squats with RTB around thighs 15X  Hip abduction 2X10 RTB  Hip extension 2X10 RTB  Sidestepping 15 feet 2Rt with RTB at thighs  4" lateral step ups X15 each with UE assist  4" forward step ups with power ups 15X each  Vectors with UE assist 5X3" each Seated:  Hip abduction with RTB 2X10   02/24/22 Standing: heel raise in mini squat position 2x 10  Squat with RTB around thigh Seated: arch formation  Towel crunch   Great toe extension Standing arch formation 4in step up with cueing to reduce hyperextension 2x 10 4in step down with 1 HR A Sidestep minisquat position RTB around thighs 2RT in hallway  Seated: LAQ with IR then ER 10x BLE 02/18/22 Reassess LEFS MMT AROM 2 MWT  Stairs (1 flight up/ down) hand rails x 2  02/11/22 Standing: Heel raises 20x 5"  Minisquat 20x front of chair  Hamstring curls 2# 10x 3" with HHA  Hip abduction 2# 2X10  Hip extension 2# 2X10  Vectors 10X5" with 1 UE assist  [place] in partial tandem stance with GTB 15 reps each direction  02/04/22 Standing:  Heel raises 10x 5"   Sidestep 2RT RTB around thigh (12 ft)  Retro gait 1RT RTB around thigh (41f)   Minisquat 10x front of  chair   Hamstring curls 3# 10x 3" with HHA   Pallof in partial tandem stance with GTB 4x 10 reps   LAQ 3# 10x  01/28/22             Standing:             Rocker board x 2'             Heel raises x 10  Minisquat x 10              Semi tandem stance x 3 B              Slant board 30" x 2            Terminal knee extension 4 pl x 10 each              Side stepping red thera-band down long counter in front x 1 RT             Hip extension with red thera-band x 10 Each             Knee flexion with red thera-band x 10 each              Leg press 3 Pl x 15   PATIENT EDUCATION:  Education details: Patient educated on exam findings, POC, scope of PT, HEP, and avoiding hyperextension. Person educated: Patient Education method: Explanation, Demonstration, and Handouts Education comprehension: verbalized understanding, returned demonstration, verbal cues required, and tactile cues required  HOME EXERCISE PROGRAM: 02/24/22: arch formation exercises 01/28/22: tadem stance Access Code: [email or web address] Exercises - Standing Tandem Balance with Counter Support  - 1 x daily - 7 x weekly - 1 sets - 3 reps - as long as possible  hold Exercises - Standing Tandem Balance with Counter Support  - 1 x daily - 7 x weekly - 1 sets - 3 reps - as long as possible  hold 01/18/22 - Mini Squat with Counter Support  - 3 x daily - 7 x weekly - 2 sets - 10 reps - Heel Raises with Counter Support  - 3 x daily - 7 x weekly - 2 sets - 10 reps - Side Stepping with Resistance at Ankles and Counter Support  - 1 x daily - 7 x weekly - 1 sets - 10 reps  Date: 01/13/2022 - Seated Long Arc Quad (Mirrored)  - 3 x daily - 7 x weekly - 2 sets - 10 reps - 5 second hold - Supine Bridge  - 3 x daily - 7 x weekly - 2 sets - 10 reps - Sidelying Hip Abduction (Mirrored)  - 3 x daily - 7 x weekly - 2 sets - 10 reps  ASSESSMENT:  CLINICAL IMPRESSION: Pt returns following 3 week absence.  Discussed NS/CX policy and pt  aware.  Went for injection in Rt knee this morning and seems to have helped some.  Lt LE with increased pain today.  Able to complete all exercises today with slight discomfort but no extreme pain.  Rt LE forward step ups most painful and vectors challenging, especially while stabilizing on Rt LE.  Cues for general form and posturing.  Reports continuation of HEP since she's been absent and can really tell an improvement, especially in the arch of her feet.  PT will continue to benefit from skilled therapy to improve strength and reduce pain in LE's.   OBJECTIVE IMPAIRMENTS Abnormal gait, decreased activity tolerance, decreased balance, decreased endurance, decreased mobility, difficulty walking, decreased ROM, decreased strength, increased edema, increased fascial restrictions, increased muscle spasms, impaired flexibility, improper body mechanics, obesity, and pain.   ACTIVITY LIMITATIONS carrying, lifting, bending, standing, squatting, stairs, transfers, locomotion level, and caring for others  PARTICIPATION LIMITATIONS: meal prep, cleaning, laundry, shopping, community activity, and yard work  Alpha, Time since onset of injury/illness/exacerbation, and 3+ comorbidities: chronic B knee pain, R hip pain, back pain, obesity,  HTN, DM  are also affecting patient's functional outcome.   REHAB POTENTIAL: Good  CLINICAL DECISION MAKING: Evolving/moderate complexity  EVALUATION COMPLEXITY: Moderate   GOALS: Goals reviewed with patient? Yes  SHORT TERM GOALS: Target date: 02/03/2022  Patient will be independent with HEP in order to improve functional outcomes. Baseline:  Goal status: MET  2.  Patient will report at least 25% improvement in symptoms for improved quality of life. Baseline: 50% Goal status: MET    LONG TERM GOALS: Target date: 02/24/2022  Patient will report at least 75% improvement in symptoms for improved quality of life. Baseline: at least 50%  better Goal status: IN PROGRESS  2.  Patient will improve LEFS score by at least 12 points in order to indicate improved tolerance to activity. Baseline: 25/80 (decreased 11 points) Goal status: IN PROGRESS  3.  Patient will be able to navigate stairs with reciprocal pattern without compensation in order to demonstrate improved LE strength. Baseline: Can do reciprocal with bilat hand rails, but decreased control/ stability with descending  Goal status: IN PROGRESS  4.  Patient will be able to ambulate at least 300 feet in 2MWT in order to demonstrate improved tolerance to activity. Baseline: 265 feet Goal status: IN PROGRESS  5.  Patient will improve ROM for L knee extension/flexion to 0-115 degrees to improve squatting, and other functional mobility. Baseline: 0-115 Goal status: MET  6.  Patient will demonstrate grade of 5/5 MMT grade in all tested musculature as evidence of improved strength to assist with stair ambulation and gait.   Baseline: see MMT Goal status: IN PROGRESS    PLAN: PT FREQUENCY: 2x/week  PT DURATION: 4 weeks  PLANNED INTERVENTIONS: Therapeutic exercises, Therapeutic activity, Neuromuscular re-education, Balance training, Gait training, Patient/Family education, Joint manipulation, Joint mobilization, Stair training, Orthotic/Fit training, DME instructions, Aquatic Therapy, Dry Needling, Electrical stimulation, Spinal manipulation, Spinal mobilization, Cryotherapy, Moist heat, Compression bandaging, scar mobilization, Splintting, Taping, Traction, Ultrasound, Ionotophoresis 74m/ml Dexamethasone, and Manual therapy   PLAN FOR NEXT SESSION: quad strength, functional strength, L quad motor control to avoid hyperextension, bilateral hip strength, gait and balance training.  Recert next visit.  ATeena Irani PTA/CLT CBentonPh: [phone] 1:07 PM, 03/16/22

## 2022-03-16 NOTE — Progress Notes (Signed)
PROCEDURE NOTE:  The patient requests injections of the right knee , verbal consent was obtained.  The right knee was prepped appropriately after time out was performed.   Sterile technique was observed and injection of 1 cc of DepoMedrol 40mg  with several cc's of plain xylocaine. Anesthesia was provided by ethyl chloride and a 20-gauge needle was used to inject the knee area. The injection was tolerated well.  A band aid dressing was applied.  The patient was advised to apply ice later today and tomorrow to the injection sight as needed.  PROCEDURE NOTE:  The patient request injection, verbal consent was obtained.  The right trochanteric area of the hip was prepped appropriately after time out was performed.   Sterile technique was observed and injection of 1 cc of DepoMedrol 40 mg with several cc's of plain xylocaine. Anesthesia was provided by ethyl chloride and a 20-gauge needle was used to inject the hip area. The injection was tolerated well.  A band aid dressing was applied.  The patient was advised to apply ice later today and tomorrow to the injection sight as needed.  Encounter Diagnoses  Name Primary?   Chronic pain of right knee Yes   Trochanteric bursitis, right hip    Return in six weeks.  I will refill Flexeril.  Call if any problem.  Precautions discussed.  Electronically Signed , MD 11/28/20238:32 AM

## 2022-03-18 ENCOUNTER — Ambulatory Visit (HOSPITAL_COMMUNITY): Payer: Medicaid Other

## 2022-03-18 DIAGNOSIS — M25562 Pain in left knee: Secondary | ICD-10-CM | POA: Diagnosis not present

## 2022-03-18 DIAGNOSIS — R2689 Other abnormalities of gait and mobility: Secondary | ICD-10-CM

## 2022-03-18 DIAGNOSIS — M6281 Muscle weakness (generalized): Secondary | ICD-10-CM | POA: Diagnosis not present

## 2022-03-18 DIAGNOSIS — M79605 Pain in left leg: Secondary | ICD-10-CM | POA: Diagnosis not present

## 2022-03-18 DIAGNOSIS — G8929 Other chronic pain: Secondary | ICD-10-CM

## 2022-03-18 DIAGNOSIS — R262 Difficulty in walking, not elsewhere classified: Secondary | ICD-10-CM

## 2022-03-18 DIAGNOSIS — M545 Low back pain, unspecified: Secondary | ICD-10-CM

## 2022-03-18 DIAGNOSIS — M25561 Pain in right knee: Secondary | ICD-10-CM | POA: Diagnosis not present

## 2022-03-18 NOTE — Therapy (Signed)
OUTPATIENT PHYSICAL THERAPY LOWER EXTREMITY PROGRESS NOTE    Progress Note Reporting Period 01/13/2022 to 03/18/2022  See note below for Objective Data and Assessment of Progress/Goals.       Patient Name: [name] MRN: [id number] DOB:04/10/1986, 36 y.o., female Today's Date: 03/18/2022     PT End of Session - 03/18/22 1259     Visit Number 10    Number of Visits 15    Date for PT Re-Evaluation 03/18/22    Authorization Type Medicaid [employer]    Authorization Time Period Approved 16 visits (9/27-12/26)    Authorization - Visit Number 9    Authorization - Number of Visits 16    PT Start Time 1300    PT Stop Time 1340    PT Time Calculation (min) 40 min    Activity Tolerance Patient tolerated treatment well    Behavior During Therapy WFL for tasks assessed/performed                Past Medical History:  Diagnosis Date   Arthritis    Hypertension    Kidney infection    UTI (urinary tract infection)    Vaginal Pap smear, abnormal    Past Surgical History:  Procedure Laterality Date   BLADDER SURGERY     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   CESAREAN SECTION     COLPOSCOPY     Patient Active Problem List   Diagnosis Date Noted   Acquired deformity of parts of limb 08/24/2021   DM (diabetes mellitus) ([place]) 08/11/2021   HTN (hypertension) 08/11/2021   Osteoarthritis 08/11/2021   Frequent UTI 07/08/2021   History of exstrophy of bladder 07/08/2021   Incomplete bladder emptying 07/08/2021   Nephrolithiasis 07/08/2021   Prediabetes 06/15/2021   Morbid obesity ([place]) 08/25/2020   Essential hypertension, benign 11/08/2019   History of cesarean delivery 08/01/2019   BMI 50.0-59.9, adult ([place]) 08/01/2019   IUFD at less than 20 weeks of gestation 07/31/2019   Marijuana use 06/21/2019   Smoker 06/20/2019   Abnormal Pap smear of cervix 06/20/2019   Depression with anxiety 06/20/2019    PCP: Carrolyn Meiers MD  REFERRING PROVIDER:  Val Eagle, MD  Next apt 05/04/21  REFERRING DIAG: tibial deformity, acquired, left (N02.725)   THERAPY DIAG:  Muscle weakness (generalized)  Difficulty in walking, not elsewhere classified  Pain in left leg  Low back pain, unspecified back pain laterality, unspecified chronicity, unspecified whether sciatica present  Chronic pain of both knees  Other abnormalities of gait and mobility  Rationale for Evaluation and Treatment Rehabilitation  ONSET DATE: 10/08/2021   SUBJECTIVE:   SUBJECTIVE STATEMENT: 40-50% Better overall; 4-5/10 pain mostly right hip and both knees; trouble primarily with walking and steps  PERTINENT HISTORY: s/p L IMN Proximal tibial with osteotomy dos: 10/08/2021, chronic B knee pain, R hip pain, back pain, obesity   PAIN:  Are you having pain? Yes: NPRS scale: 2-3/10 Pain location: L shin/knee and Rt hip Pain description: discomfort Aggravating factors: weightbearing, walking Relieving factors: rest  PRECAUTIONS: None  WEIGHT BEARING RESTRICTIONS No  FALLS:  Has patient fallen in last 6 months? No  LIVING ENVIRONMENT: Lives with: lives with their family Lives in: Mobile home Stairs: Yes: External: 6 steps; can reach both Has following equipment at home: Single point cane, Walker - 2 wheeled, Wheelchair (manual), and bed side commode  OCCUPATION: [employer]  PLOF: Independent  PATIENT GOALS improve mobility   OBJECTIVE:   PATIENT  SURVEYS:  LEFS 25/80 (was 36/80)  COGNITION:  Overall cognitive status: Within functional limits for tasks assessed     SENSATION: WFL   POSTURE: rounded shoulders and forward head  PALPATION: No TTP  LOWER EXTREMITY ROM:  Active ROM Right eval Left eval Left  02/18/22 Left 03/18/22  Hip flexion      Hip extension      Hip abduction      Hip adduction      Hip internal rotation      Hip external rotation      Knee flexion 115 104 115 115  Knee extension  4 hyperextension 0 0   Ankle dorsiflexion      Ankle plantarflexion      Ankle inversion      Ankle eversion       (Blank rows = not tested)  LOWER EXTREMITY MMT:  MMT Right eval Left eval Right 02/18/22 Left 02/18/22 Right 03/18/22 Left 03/18/22  Hip flexion 4+ 4+ 4+ 4+ 4+ 4+  Hip extension 4+ 4+ 4 4+    Hip abduction 3+ 4 4- 4 4- 4+  Hip adduction        Hip internal rotation        Hip external rotation        Knee flexion _0 4+    Knee extension 5 4-* _1 Ankle dorsiflexion _2 Ankle plantarflexion        Ankle inversion        Ankle eversion         (Blank rows = not tested)   FUNCTIONAL TESTS:  5 times sit to stand: 15.70 seconds 2 minute walk test: 265 feet (was 230 feet)  GAIT: Distance walked: 265 feet Assistive device utilized: None Level of assistance: Complete Independence Comments: 2MWT, antalgic, limited knee flexion/extension ROM bilateral, severe valgus fatigued during/following in LEs    TODAY'S TREATMENT: 03/18/22 Progress note LEFS 30/80 37.5% 5 times sit to stand  21.07 sec MMT's and ROM see above  Seated hip abduction GTB 2 x 10  Sidestepping with GTB around legs length of mat x 5 Toe raises x 15 Hip extension x 10 each  03/16/22 Standing: Heelraises 15X  Squats with RTB around thighs 15X  Hip abduction 2X10 RTB  Hip extension 2X10 RTB  Sidestepping 15 feet 2Rt with RTB at thighs  4" lateral step ups X15 each with UE assist  4" forward step ups with power ups 15X each  Vectors with UE assist 5X3" each Seated:  Hip abduction with RTB 2X10   02/24/22 Standing: heel raise in mini squat position 2x 10  Squat with RTB around thigh Seated: arch formation  Towel crunch   Great toe extension Standing arch formation 4in step up with cueing to reduce hyperextension 2x 10 4in step down with 1 HR A Sidestep minisquat position RTB around thighs 2RT in hallway  Seated: LAQ with IR then ER 10x BLE 02/18/22 Reassess LEFS MMT AROM 2 MWT   Stairs (1 flight up/ down) hand rails x 2  02/11/22 Standing: Heel raises 20x 5"  Minisquat 20x front of chair  Hamstring curls 2# 10x 3" with HHA  Hip abduction 2# 2X10  Hip extension 2# 2X10  Vectors 10X5" with 1 UE assist  Pallof in partial tandem stance with GTB 15 reps each direction  02/04/22 Standing:  Heel raises 10x 5"   Sidestep 2RT RTB around thigh (12  ft)  Retro gait 1RT RTB around thigh (37f)   Minisquat 10x front of chair   Hamstring curls 3# 10x 3" with HHA   Pallof in partial tandem stance with GTB 4x 10 reps   LAQ 3# 10x  01/28/22             Standing:             Rocker board x 2'             Heel raises x 10              Minisquat x 10              Semi tandem stance x 3 B              Slant board 30" x 2            Terminal knee extension 4 pl x 10 each              Side stepping red thera-band down long counter in front x 1 RT             Hip extension with red thera-band x 10 Each             Knee flexion with red thera-band x 10 each              Leg press 3 Pl x 15   PATIENT EDUCATION:  Education details: Patient educated on exam findings, POC, scope of PT, HEP, and avoiding hyperextension. Person educated: Patient Education method: Explanation, Demonstration, and Handouts Education comprehension: verbalized understanding, returned demonstration, verbal cues required, and tactile cues required  HOME EXERCISE PROGRAM: 02/24/22: arch formation exercises 01/28/22: tadem stance Access Code: [email or web address] - Standing Tandem Balance with Counter Support  - 1 x daily - 7 x weekly - 1 sets - 3 reps - as long as possible  hold Exercises - Standing Tandem Balance with Counter Support  - 1 x daily - 7 x weekly - 1 sets - 3 reps - as long as possible  hold 01/18/22 - Mini Squat with Counter Support  - 3 x daily - 7 x weekly - 2 sets - 10 reps - Heel Raises with Counter Support  - 3 x daily - 7 x weekly - 2 sets - 10 reps - Side Stepping with  Resistance at Ankles and Counter Support  - 1 x daily - 7 x weekly - 1 sets - 10 reps  Date: 01/13/2022 - Seated Long Arc Quad (Mirrored)  - 3 x daily - 7 x weekly - 2 sets - 10 reps - 5 second hold - Supine Bridge  - 3 x daily - 7 x weekly - 2 sets - 10 reps - Sidelying Hip Abduction (Mirrored)  - 3 x daily - 7 x weekly - 2 sets - 10 reps  ASSESSMENT:  CLINICAL IMPRESSION: Progress note today for new cert; improved strength noted but some decline with functional score; 5 times sit to stand.  Will recommend further PT due to missing some appointments and to address partially met and unmet goals.    PT will continue to benefit from skilled therapy to improve strength and reduce pain in LE's.   OBJECTIVE IMPAIRMENTS Abnormal gait, decreased activity tolerance, decreased balance, decreased endurance, decreased mobility, difficulty walking, decreased ROM, decreased strength, increased edema, increased fascial restrictions, increased muscle spasms, impaired flexibility, improper body mechanics, obesity, and pain.   ACTIVITY LIMITATIONS carrying,  lifting, bending, standing, squatting, stairs, transfers, locomotion level, and caring for others  PARTICIPATION LIMITATIONS: meal prep, cleaning, laundry, shopping, community activity, and yard work  [place], Time since onset of injury/illness/exacerbation, and 3+ comorbidities: chronic B knee pain, R hip pain, back pain, obesity, HTN, DM  are also affecting patient's functional outcome.   REHAB POTENTIAL: Good  CLINICAL DECISION MAKING: Evolving/moderate complexity  EVALUATION COMPLEXITY: Moderate   GOALS: Goals reviewed with patient? Yes  SHORT TERM GOALS: Target date: 02/03/2022  Patient will be independent with HEP in order to improve functional outcomes. Baseline:  Goal status: MET  2.  Patient will report at least 25% improvement in symptoms for improved quality of life. Baseline: 50% Goal status: MET    LONG TERM  GOALS: Target date: 02/24/2022  Patient will report at least 75% improvement in symptoms for improved quality of life. Baseline: at least 50% better Goal status: IN PROGRESS  2.  Patient will improve LEFS score by at least 12 points in order to indicate improved tolerance to activity. Baseline: 25/80 (decreased 11 points) Goal status: IN PROGRESS  3.  Patient will be able to navigate stairs with reciprocal pattern without compensation in order to demonstrate improved LE strength. Baseline: Can do reciprocal with bilat hand rails, but decreased control/ stability with descending  Goal status: IN PROGRESS  4.  Patient will be able to ambulate at least 300 feet in 2MWT in order to demonstrate improved tolerance to activity. Baseline: 265 feet Goal status: IN PROGRESS  5.  Patient will improve ROM for L knee extension/flexion to 0-115 degrees to improve squatting, and other functional mobility. Baseline: 0-115 Goal status: MET  6.  Patient will demonstrate grade of 5/5 MMT grade in all tested musculature as evidence of improved strength to assist with stair ambulation and gait.   Baseline: see MMT Goal status: IN PROGRESS    PLAN: PT FREQUENCY: 2x/week  PT DURATION: 4 weeks  PLANNED INTERVENTIONS: Therapeutic exercises, Therapeutic activity, Neuromuscular re-education, Balance training, Gait training, Patient/Family education, Joint manipulation, Joint mobilization, Stair training, Orthotic/Fit training, DME instructions, Aquatic Therapy, Dry Needling, Electrical stimulation, Spinal manipulation, Spinal mobilization, Cryotherapy, Moist heat, Compression bandaging, scar mobilization, Splintting, Taping, Traction, Ultrasound, Ionotophoresis 28m/ml Dexamethasone, and Manual therapy   PLAN FOR NEXT SESSION: quad strength, functional strength, L quad motor control to avoid hyperextension, bilateral hip strength, gait and balance training.    1:48 PM, 03/18/22 Lathen Seal Small Evart Mcdonnell MPT [facility] [place] #[phone]

## 2022-03-23 ENCOUNTER — Ambulatory Visit (HOSPITAL_COMMUNITY): Payer: Medicaid Other | Attending: Internal Medicine | Admitting: Physical Therapy

## 2022-03-23 DIAGNOSIS — M25562 Pain in left knee: Secondary | ICD-10-CM | POA: Insufficient documentation

## 2022-03-23 DIAGNOSIS — G8929 Other chronic pain: Secondary | ICD-10-CM | POA: Insufficient documentation

## 2022-03-23 DIAGNOSIS — M545 Low back pain, unspecified: Secondary | ICD-10-CM | POA: Insufficient documentation

## 2022-03-23 DIAGNOSIS — R262 Difficulty in walking, not elsewhere classified: Secondary | ICD-10-CM | POA: Diagnosis not present

## 2022-03-23 DIAGNOSIS — M6281 Muscle weakness (generalized): Secondary | ICD-10-CM | POA: Insufficient documentation

## 2022-03-23 DIAGNOSIS — M79605 Pain in left leg: Secondary | ICD-10-CM | POA: Diagnosis not present

## 2022-03-23 DIAGNOSIS — R2689 Other abnormalities of gait and mobility: Secondary | ICD-10-CM | POA: Insufficient documentation

## 2022-03-23 DIAGNOSIS — M25561 Pain in right knee: Secondary | ICD-10-CM | POA: Diagnosis not present

## 2022-03-23 NOTE — Therapy (Signed)
OUTPATIENT PHYSICAL THERAPY TREATMENT     Patient Name: [name] MRN: [id number] DOB:[birth year], 36 y.o., female Today's Date: 03/23/2022     PT End of Session - 03/23/22 1440     Visit Number 11    Number of Visits 15    Date for PT Re-Evaluation 04/14/22    Authorization Type Medicaid [employer]    Authorization Time Period Approved 16 visits (9/27-12/26)    Authorization - Visit Number 11    Authorization - Number of Visits 16    PT Start Time 1435    PT Stop Time 1515    PT Time Calculation (min) 40 min    Activity Tolerance Patient tolerated treatment well    Behavior During Therapy WFL for tasks assessed/performed                Past Medical History:  Diagnosis Date   Arthritis    Hypertension    Kidney infection    UTI (urinary tract infection)    Vaginal Pap smear, abnormal    Past Surgical History:  Procedure Laterality Date   BLADDER SURGERY     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   CESAREAN SECTION     COLPOSCOPY     Patient Active Problem List   Diagnosis Date Noted   Acquired deformity of parts of limb 08/24/2021   DM (diabetes mellitus) ([place]) 08/11/2021   HTN (hypertension) 08/11/2021   Osteoarthritis 08/11/2021   Frequent UTI 07/08/2021   History of exstrophy of bladder 07/08/2021   Incomplete bladder emptying 07/08/2021   Nephrolithiasis 07/08/2021   Prediabetes 06/15/2021   Morbid obesity ([place]) 08/25/2020   Essential hypertension, benign 11/08/2019   History of cesarean delivery 08/01/2019   BMI 50.0-59.9, adult ([place]) 08/01/2019   IUFD at less than 20 weeks of gestation 07/31/2019   Marijuana use 06/21/2019   Smoker 06/20/2019   Abnormal Pap smear of cervix 06/20/2019   Depression with anxiety 06/20/2019    PCP: Carrolyn Meiers MD  REFERRING PROVIDER: Val Eagle, MD  Next apt 05/04/21  REFERRING DIAG: tibial deformity, acquired, left (B20.100)   THERAPY DIAG:  Muscle weakness  (generalized)  Difficulty in walking, not elsewhere classified  Low back pain, unspecified back pain laterality, unspecified chronicity, unspecified whether sciatica present  Pain in left leg  Chronic pain of both knees  Other abnormalities of gait and mobility  Rationale for Evaluation and Treatment Rehabilitation  ONSET DATE: 10/08/2021   SUBJECTIVE:   SUBJECTIVE STATEMENT: Pt states she's done 4,000 steps so far today.  Currently 6/10 Rt hip and only tenderness in knees but no real pain.  PERTINENT HISTORY: s/p L IMN Proximal tibial with osteotomy dos: 10/08/2021, chronic B knee pain, R hip pain, back pain, obesity   PAIN:  Are you having pain? Yes: NPRS scale: 2-3/10 Pain location: L shin/knee and Rt hip Pain description: discomfort Aggravating factors: weightbearing, walking Relieving factors: rest  PRECAUTIONS: None  WEIGHT BEARING RESTRICTIONS No  FALLS:  Has patient fallen in last 6 months? No  LIVING ENVIRONMENT: Lives with: lives with their family Lives in: Mobile home Stairs: Yes: External: 6 steps; can reach both Has following equipment at home: Single point cane, Walker - 2 wheeled, Wheelchair (manual), and bed side commode  OCCUPATION: [employer]  PLOF: Independent  PATIENT GOALS improve mobility   OBJECTIVE:   PATIENT SURVEYS:  LEFS 25/80 (was 36/80)  COGNITION:  Overall cognitive status: Within functional limits for tasks assessed  SENSATION: WFL   POSTURE: rounded shoulders and forward head  PALPATION: No TTP  LOWER EXTREMITY ROM:  Active ROM Right eval Left eval Left  02/18/22 Left 03/18/22  Hip flexion      Hip extension      Hip abduction      Hip adduction      Hip internal rotation      Hip external rotation      Knee flexion 115 104 115 115  Knee extension  4 hyperextension 0 0  Ankle dorsiflexion      Ankle plantarflexion      Ankle inversion      Ankle eversion       (Blank rows = not tested)  LOWER  EXTREMITY MMT:  MMT Right eval Left eval Right 02/18/22 Left 02/18/22 Right 03/18/22 Left 03/18/22  Hip flexion 4+ 4+ 4+ 4+ 4+ 4+  Hip extension 4+ 4+ 4 4+    Hip abduction 3+ 4 4- 4 4- 4+  Hip adduction        Hip internal rotation        Hip external rotation        Knee flexion _0 4+    Knee extension 5 4-* _1 Ankle dorsiflexion _2 Ankle plantarflexion        Ankle inversion        Ankle eversion         (Blank rows = not tested)   FUNCTIONAL TESTS:  5 times sit to stand: 15.70 seconds 2 minute walk test: 265 feet (was 230 feet)  GAIT: Distance walked: 265 feet Assistive device utilized: None Level of assistance: Complete Independence Comments: 2MWT, antalgic, limited knee flexion/extension ROM bilateral, severe valgus fatigued during/following in LEs    TODAY'S TREATMENT:  03/23/2022 Functional squat in front of chair 2X10 Standing:  Heel and toe raises 15X  Side stepping 20 feet X 2RT with GTB at knees  Hip abduction 2X10 GTB  Hip extension 2X10 GTB  Paloff GTB 15X each NBOS  Rows GTB 15X  Forward step ups 4" 2X10 each LE Hip abduction GTB 2X10 singles and doubles (Pt deferred vectors today)  03/18/22 Progress note LEFS 30/80 37.5% 5 times sit to stand  21.07 sec MMT's and ROM see above  Seated hip abduction GTB 2 x 10  Sidestepping with GTB around legs length of mat x 5 Toe raises x 15 Hip extension x 10 each  03/16/22 Standing: Heelraises 15X  Squats with RTB around thighs 15X  Hip abduction 2X10 RTB  Hip extension 2X10 RTB  Sidestepping 15 feet 2Rt with RTB at thighs  4" lateral step ups X15 each with UE assist  4" forward step ups with power ups 15X each  Vectors with UE assist 5X3" each Seated:  Hip abduction with RTB 2X10   02/24/22 Standing: heel raise in mini squat position 2x 10  Squat with RTB around thigh Seated: arch formation  Towel crunch   Great toe extension Standing arch formation 4in step up with  cueing to reduce hyperextension 2x 10 4in step down with 1 HR A Sidestep minisquat position RTB around thighs 2RT in hallway  Seated: LAQ with IR then ER 10x BLE 02/18/22 Reassess LEFS MMT AROM 2 MWT  Stairs (1 flight up/ down) hand rails x 2  02/11/22 Standing: Heel raises 20x 5"  Minisquat 20x front of chair  Hamstring curls 2# 10x 3" with HHA  Hip abduction 2# 2X10  Hip extension 2# 2X10  Vectors 10X5" with 1 UE assist  Pallof in partial tandem stance with GTB 15 reps each direction  02/04/22 Standing:  Heel raises 10x 5"   Sidestep 2RT RTB around thigh (12 ft)  Retro gait 1RT RTB around thigh (30f)   Minisquat 10x front of chair   Hamstring curls 3# 10x 3" with HHA   Pallof in partial tandem stance with GTB 4x 10 reps   LAQ 3# 10x  01/28/22             Standing:             Rocker board x 2'             Heel raises x 10              Minisquat x 10              Semi tandem stance x 3 B              Slant board 30" x 2            Terminal knee extension 4 pl x 10 each              Side stepping red thera-band down long counter in front x 1 RT             Hip extension with red thera-band x 10 Each             Knee flexion with red thera-band x 10 each              Leg press 3 Pl x 15   PATIENT EDUCATION:  Education details: Patient educated on exam findings, POC, scope of PT, HEP, and avoiding hyperextension. Person educated: Patient Education method: Explanation, Demonstration, and Handouts Education comprehension: verbalized understanding, returned demonstration, verbal cues required, and tactile cues required  HOME EXERCISE PROGRAM: 02/24/22: arch formation exercises 01/28/22: tadem stance Access Code: [email or web address] - Standing Tandem Balance with Counter Support  - 1 x daily - 7 x weekly - 1 sets - 3 reps - as long as possible  hold Exercises - Standing Tandem Balance with Counter Support  - 1 x daily - 7 x weekly - 1 sets - 3 reps - as long as  possible  hold 01/18/22 - Mini Squat with Counter Support  - 3 x daily - 7 x weekly - 2 sets - 10 reps - Heel Raises with Counter Support  - 3 x daily - 7 x weekly - 2 sets - 10 reps - Side Stepping with Resistance at Ankles and Counter Support  - 1 x daily - 7 x weekly - 1 sets - 10 reps  Date: 01/13/2022 - Seated Long Arc Quad (Mirrored)  - 3 x daily - 7 x weekly - 2 sets - 10 reps - 5 second hold - Supine Bridge  - 3 x daily - 7 x weekly - 2 sets - 10 reps - Sidelying Hip Abduction (Mirrored)  - 3 x daily - 7 x weekly - 2 sets - 10 reps  ASSESSMENT:  CLINICAL IMPRESSION: Continued with established therex.  Attempted to add back vectors, however pt declined as it is too painful.  Improving form with squat with less cues. Pt awaiting a joint specialist appt with hopes to get some answers and help.  PT will continue to benefit from skilled therapy to improve strength and reduce pain  in LE's.   OBJECTIVE IMPAIRMENTS Abnormal gait, decreased activity tolerance, decreased balance, decreased endurance, decreased mobility, difficulty walking, decreased ROM, decreased strength, increased edema, increased fascial restrictions, increased muscle spasms, impaired flexibility, improper body mechanics, obesity, and pain.   ACTIVITY LIMITATIONS carrying, lifting, bending, standing, squatting, stairs, transfers, locomotion level, and caring for others  PARTICIPATION LIMITATIONS: meal prep, cleaning, laundry, shopping, community activity, and yard work  [place], Time since onset of injury/illness/exacerbation, and 3+ comorbidities: chronic B knee pain, R hip pain, back pain, obesity, HTN, DM  are also affecting patient's functional outcome.   REHAB POTENTIAL: Good  CLINICAL DECISION MAKING: Evolving/moderate complexity  EVALUATION COMPLEXITY: Moderate   GOALS: Goals reviewed with patient? Yes  SHORT TERM GOALS: Target date: 02/03/2022  Patient will be independent with HEP in  order to improve functional outcomes. Baseline:  Goal status: MET  2.  Patient will report at least 25% improvement in symptoms for improved quality of life. Baseline: 50% Goal status: MET    LONG TERM GOALS: Target date: 02/24/2022  Patient will report at least 75% improvement in symptoms for improved quality of life. Baseline: at least 50% better Goal status: IN PROGRESS  2.  Patient will improve LEFS score by at least 12 points in order to indicate improved tolerance to activity. Baseline: 25/80 (decreased 11 points) Goal status: IN PROGRESS  3.  Patient will be able to navigate stairs with reciprocal pattern without compensation in order to demonstrate improved LE strength. Baseline: Can do reciprocal with bilat hand rails, but decreased control/ stability with descending  Goal status: IN PROGRESS  4.  Patient will be able to ambulate at least 300 feet in 2MWT in order to demonstrate improved tolerance to activity. Baseline: 265 feet Goal status: IN PROGRESS  5.  Patient will improve ROM for L knee extension/flexion to 0-115 degrees to improve squatting, and other functional mobility. Baseline: 0-115 Goal status: MET  6.  Patient will demonstrate grade of 5/5 MMT grade in all tested musculature as evidence of improved strength to assist with stair ambulation and gait.   Baseline: see MMT Goal status: IN PROGRESS    PLAN: PT FREQUENCY: 2x/week  PT DURATION: 4 weeks  PLANNED INTERVENTIONS: Therapeutic exercises, Therapeutic activity, Neuromuscular re-education, Balance training, Gait training, Patient/Family education, Joint manipulation, Joint mobilization, Stair training, Orthotic/Fit training, DME instructions, Aquatic Therapy, Dry Needling, Electrical stimulation, Spinal manipulation, Spinal mobilization, Cryotherapy, Moist heat, Compression bandaging, scar mobilization, Splintting, Taping, Traction, Ultrasound, Ionotophoresis 29m/ml Dexamethasone, and Manual therapy    PLAN FOR NEXT SESSION: quad strength, functional strength, L quad motor control to avoid hyperextension, bilateral hip strength, gait and balance training.    2:52 PM, 03/23/22 ATeena Irani PTA/CLT CSteamboat SpringsPh: [phone]

## 2022-03-24 DIAGNOSIS — M25551 Pain in right hip: Secondary | ICD-10-CM | POA: Diagnosis not present

## 2022-03-24 DIAGNOSIS — M21162 Varus deformity, not elsewhere classified, left knee: Secondary | ICD-10-CM | POA: Diagnosis not present

## 2022-03-29 ENCOUNTER — Encounter (HOSPITAL_COMMUNITY): Payer: Medicaid Other | Admitting: Physical Therapy

## 2022-03-31 ENCOUNTER — Encounter (HOSPITAL_COMMUNITY): Payer: Medicaid Other | Admitting: Physical Therapy

## 2022-04-07 ENCOUNTER — Encounter (HOSPITAL_COMMUNITY): Payer: Medicaid Other | Admitting: Physical Therapy

## 2022-04-14 ENCOUNTER — Ambulatory Visit (HOSPITAL_COMMUNITY): Payer: Medicaid Other

## 2022-04-14 DIAGNOSIS — R262 Difficulty in walking, not elsewhere classified: Secondary | ICD-10-CM

## 2022-04-14 DIAGNOSIS — M6281 Muscle weakness (generalized): Secondary | ICD-10-CM

## 2022-04-14 DIAGNOSIS — M79605 Pain in left leg: Secondary | ICD-10-CM | POA: Diagnosis not present

## 2022-04-14 DIAGNOSIS — R2689 Other abnormalities of gait and mobility: Secondary | ICD-10-CM | POA: Diagnosis not present

## 2022-04-14 DIAGNOSIS — M25561 Pain in right knee: Secondary | ICD-10-CM | POA: Diagnosis not present

## 2022-04-14 DIAGNOSIS — M25562 Pain in left knee: Secondary | ICD-10-CM | POA: Diagnosis not present

## 2022-04-14 DIAGNOSIS — G8929 Other chronic pain: Secondary | ICD-10-CM | POA: Diagnosis not present

## 2022-04-14 DIAGNOSIS — M545 Low back pain, unspecified: Secondary | ICD-10-CM | POA: Diagnosis not present

## 2022-04-14 NOTE — Therapy (Signed)
OUTPATIENT PHYSICAL THERAPY PROGRESS NOTE/DISCHARGE NOTE PHYSICAL THERAPY DISCHARGE SUMMARY  Visits from Start of Care: 12  Current functional level related to goals / functional outcomes: See below   Remaining deficits: See below   Education / Equipment: See below   Patient agrees to discharge. Patient goals were partially met. Patient is being discharged due to maximized rehab potential.    Progress Note Reporting Period 01/13/2022 to 04/14/2022  See note below for Objective Data and Assessment of Progress/Goals.         Patient Name: [name] MRN: [id number] DOB:[birth year], 36 y.o., female Today's Date: 04/14/2022     PT End of Session - 04/14/22 0739     Visit Number 12    Number of Visits 15    Date for PT Re-Evaluation 04/14/22    Authorization Type Medicaid [employer]    Authorization Time Period Approved 16 visits (9/27-12/26)    Authorization - Visit Number 12    Authorization - Number of Visits 16    PT Start Time 0735    PT Stop Time 0814    PT Time Calculation (min) 39 min    Activity Tolerance Patient tolerated treatment well    Behavior During Therapy WFL for tasks assessed/performed                Past Medical History:  Diagnosis Date   Arthritis    Hypertension    Kidney infection    UTI (urinary tract infection)    Vaginal Pap smear, abnormal    Past Surgical History:  Procedure Laterality Date   BLADDER SURGERY     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   CESAREAN SECTION     COLPOSCOPY     Patient Active Problem List   Diagnosis Date Noted   Acquired deformity of parts of limb 08/24/2021   DM (diabetes mellitus) ([place]) 08/11/2021   HTN (hypertension) 08/11/2021   Osteoarthritis 08/11/2021   Frequent UTI 07/08/2021   History of exstrophy of bladder 07/08/2021   Incomplete bladder emptying 07/08/2021   Nephrolithiasis 07/08/2021   Prediabetes 06/15/2021   Morbid obesity ([place]) 08/25/2020   Essential hypertension,  benign 11/08/2019   History of cesarean delivery 08/01/2019   BMI 50.0-59.9, adult ([place]) 08/01/2019   IUFD at less than 20 weeks of gestation 07/31/2019   Marijuana use 06/21/2019   Smoker 06/20/2019   Abnormal Pap smear of cervix 06/20/2019   Depression with anxiety 06/20/2019    PCP: Carrolyn Meiers MD  REFERRING PROVIDER: Val Eagle, MD  Next apt 05/04/21  REFERRING DIAG: tibial deformity, acquired, left (V76.160)   THERAPY DIAG:  Muscle weakness (generalized)  Difficulty in walking, not elsewhere classified  Rationale for Evaluation and Treatment Rehabilitation  ONSET DATE: 10/08/2021   SUBJECTIVE:   SUBJECTIVE STATEMENT: About "45%" better overall; reports short distances are good; has a hard time with long distances; right hip is the most painful; knees are doing ok today; had injections end of November; is getting them every 6 weeks or so.  Has missed some appointments with family being sick.   PERTINENT HISTORY: s/p L IMN Proximal tibial with osteotomy dos: 10/08/2021, chronic B knee pain, R hip pain, back pain, obesity   PAIN:  Are you having pain? Yes: NPRS scale: 3/10 Pain location:  Rt hip Pain description: discomfort Aggravating factors: weightbearing, walking Relieving factors: rest  PRECAUTIONS: None  WEIGHT BEARING RESTRICTIONS No  FALLS:  Has patient fallen in last 6 months? No  LIVING  ENVIRONMENT: Lives with: lives with their family Lives in: Mobile home Stairs: Yes: External: 6 steps; can reach both Has following equipment at home: Single point cane, Walker - 2 wheeled, Wheelchair (manual), and bed side commode  OCCUPATION: [employer]  PLOF: Independent  PATIENT GOALS improve mobility   OBJECTIVE:   PATIENT SURVEYS:  LEFS 25/80 (was 36/80)  COGNITION:  Overall cognitive status: Within functional limits for tasks assessed     SENSATION: WFL   POSTURE: rounded shoulders and forward head  PALPATION: No  TTP  LOWER EXTREMITY ROM:  Active ROM Right eval Left eval Left  02/18/22 Left 03/18/22  Hip flexion      Hip extension      Hip abduction      Hip adduction      Hip internal rotation      Hip external rotation      Knee flexion 115 104 115 115  Knee extension  4 hyperextension 0 0  Ankle dorsiflexion      Ankle plantarflexion      Ankle inversion      Ankle eversion       (Blank rows = not tested)  LOWER EXTREMITY MMT:  MMT Right eval Left eval Right 02/18/22 Left 02/18/22 Right 03/18/22 Left 03/18/22 Right 04/14/22 Left 04/14/22  Hip flexion 4+ 4+ 4+ 4+ 4+ 4+ 4+ 4+  Hip extension 4+ 4+ 4 4+      Hip abduction 3+ 4 4- 4 4- 4+ 3+** 4+  Hip adduction          Hip internal rotation          Hip external rotation          Knee flexion _0 4+      Knee extension 5 4-* _1 Ankle dorsiflexion _2 Ankle plantarflexion          Ankle inversion          Ankle eversion           (Blank rows = not tested)   FUNCTIONAL TESTS:  5 times sit to stand: 15.70 seconds 2 minute walk test: 265 feet (was 230 feet)  GAIT: Distance walked: 265 feet Assistive device utilized: None Level of assistance: Complete Independence Comments: 2MWT, antalgic, limited knee flexion/extension ROM bilateral, severe valgus fatigued during/following in LEs    TODAY'S TREATMENT: 04/14/22 Progress note/re-eval visit LEFS 37/80 5 times sit to stand 18.83 sec (bothers right hip) 2 MWT 309 ft MMT's see above  03/23/2022 Functional squat in front of chair 2X10 Standing:  Heel and toe raises 15X  Side stepping 20 feet X 2RT with GTB at knees  Hip abduction 2X10 GTB  Hip extension 2X10 GTB  Paloff GTB 15X each NBOS  Rows GTB 15X  Forward step ups 4" 2X10 each LE Hip abduction GTB 2X10 singles and doubles (Pt deferred vectors today)  03/18/22 Progress note LEFS 30/80 37.5% 5 times sit to stand  21.07 sec MMT's and ROM see above  Seated hip abduction GTB 2 x  10  Sidestepping with GTB around legs length of mat x 5 Toe raises x 15 Hip extension x 10 each  03/16/22 Standing: Heelraises 15X  Squats with RTB around thighs 15X  Hip abduction 2X10 RTB  Hip extension 2X10 RTB  Sidestepping 15 feet 2Rt with RTB at thighs  4" lateral step ups X15 each with UE assist  4" forward step ups with power ups 15X each  Vectors with UE assist 5X3" each Seated:  Hip abduction with RTB 2X10   02/24/22 Standing: heel raise in mini squat position 2x 10  Squat with RTB around thigh Seated: arch formation  Towel crunch   Great toe extension Standing arch formation 4in step up with cueing to reduce hyperextension 2x 10 4in step down with 1 HR A Sidestep minisquat position RTB around thighs 2RT in hallway  Seated: LAQ with IR then ER 10x BLE 02/18/22 Reassess LEFS MMT AROM 2 MWT  Stairs (1 flight up/ down) hand rails x 2  02/11/22 Standing: Heel raises 20x 5"  Minisquat 20x front of chair  Hamstring curls 2# 10x 3" with HHA  Hip abduction 2# 2X10  Hip extension 2# 2X10  Vectors 10X5" with 1 UE assist  [place] in partial tandem stance with GTB 15 reps each direction  02/04/22 Standing:  Heel raises 10x 5"   Sidestep 2RT RTB around thigh (12 ft)  Retro gait 1RT RTB around thigh (70f)   Minisquat 10x front of chair   Hamstring curls 3# 10x 3" with HHA   Pallof in partial tandem stance with GTB 4x 10 reps   LAQ 3# 10x  01/28/22             Standing:             Rocker board x 2'             Heel raises x 10              Minisquat x 10              Semi tandem stance x 3 B              Slant board 30" x 2            Terminal knee extension 4 pl x 10 each              Side stepping red thera-band down long counter in front x 1 RT             Hip extension with red thera-band x 10 Each             Knee flexion with red thera-band x 10 each              Leg press 3 Pl x 15   PATIENT EDUCATION:  Education details: Patient educated on  exam findings, POC, scope of PT, HEP, and avoiding hyperextension. Person educated: Patient Education method: Explanation, Demonstration, and Handouts Education comprehension: verbalized understanding, returned demonstration, verbal cues required, and tactile cues required  HOME EXERCISE PROGRAM: 02/24/22: arch formation exercises 01/28/22: tadem stance Access Code: [email or web address] - Standing Tandem Balance with Counter Support  - 1 x daily - 7 x weekly - 1 sets - 3 reps - as long as possible  hold Exercises - Standing Tandem Balance with Counter Support  - 1 x daily - 7 x weekly - 1 sets - 3 reps - as long as possible  hold 01/18/22 - Mini Squat with Counter Support  - 3 x daily - 7 x weekly - 2 sets - 10 reps - Heel Raises with Counter Support  - 3 x daily - 7 x weekly - 2 sets - 10 reps - Side Stepping with Resistance at Ankles and Counter Support  - 1 x daily - 7 x weekly - 1 sets -  10 reps  Date: 01/13/2022 - Seated Long Arc Quad (Mirrored)  - 3 x daily - 7 x weekly - 2 sets - 10 reps - 5 second hold - Supine Bridge  - 3 x daily - 7 x weekly - 2 sets - 10 reps - Sidelying Hip Abduction (Mirrored)  - 3 x daily - 7 x weekly - 2 sets - 10 reps  ASSESSMENT:  CLINICAL IMPRESSION: Progress note today; patient has met all STG's and 3/6 LTG's and her progress is starting to plateau; she has some increased right hip pain but decreased bilateral knee pain; patient is independent with HEP and sees surgeon end of next month to discuss possible surgery on her right knee.  Agreeable to discharge today   OBJECTIVE IMPAIRMENTS Abnormal gait, decreased activity tolerance, decreased balance, decreased endurance, decreased mobility, difficulty walking, decreased ROM, decreased strength, increased edema, increased fascial restrictions, increased muscle spasms, impaired flexibility, improper body mechanics, obesity, and pain.   ACTIVITY LIMITATIONS carrying, lifting, bending, standing, squatting,  stairs, transfers, locomotion level, and caring for others  PARTICIPATION LIMITATIONS: meal prep, cleaning, laundry, shopping, community activity, and yard work  [place], Time since onset of injury/illness/exacerbation, and 3+ comorbidities: chronic B knee pain, R hip pain, back pain, obesity, HTN, DM  are also affecting patient's functional outcome.   REHAB POTENTIAL: Good  CLINICAL DECISION MAKING: Evolving/moderate complexity  EVALUATION COMPLEXITY: Moderate   GOALS: Goals reviewed with patient? Yes  SHORT TERM GOALS: Target date: 02/03/2022  Patient will be independent with HEP in order to improve functional outcomes. Baseline:  Goal status: MET  2.  Patient will report at least 25% improvement in symptoms for improved quality of life. Baseline: 50% Goal status: MET    LONG TERM GOALS: Target date: 02/24/2022  Patient will report at least 75% improvement in symptoms for improved quality of life. Baseline: at least 50% better Goal status: IN PROGRESS  2.  Patient will improve LEFS score by at least 12 points in order to indicate improved tolerance to activity. Baseline: 25/80 (decreased 11 points); 12/27 37/80 Goal status: MET  3.  Patient will be able to navigate stairs with reciprocal pattern without compensation in order to demonstrate improved LE strength. Baseline: Can do reciprocal with bilat hand rails, but decreased control/ stability with descending  Goal status: IN PROGRESS  4.  Patient will be able to ambulate at least 300 feet in 2MWT in order to demonstrate improved tolerance to activity. Baseline: 265 feet; 309 ft 04/14/22  Goal status: MET  5.  Patient will improve ROM for L knee extension/flexion to 0-115 degrees to improve squatting, and other functional mobility. Baseline: 0-115 Goal status: MET  6.  Patient will demonstrate grade of 5/5 MMT grade in all tested musculature as evidence of improved strength to assist with stair  ambulation and gait.   Baseline: see MMT Goal status: IN PROGRESS    PLAN: PT FREQUENCY: 2x/week  PT DURATION: 4 weeks  PLANNED INTERVENTIONS: Therapeutic exercises, Therapeutic activity, Neuromuscular re-education, Balance training, Gait training, Patient/Family education, Joint manipulation, Joint mobilization, Stair training, Orthotic/Fit training, DME instructions, Aquatic Therapy, Dry Needling, Electrical stimulation, Spinal manipulation, Spinal mobilization, Cryotherapy, Moist heat, Compression bandaging, scar mobilization, Splintting, Taping, Traction, Ultrasound, Ionotophoresis 68m/ml Dexamethasone, and Manual therapy   PLAN FOR NEXT SESSION: discharge   8:17 AM, 04/14/22 Kanyon Bunn Small Navi Ew[place] MPT [facility] [place] #[phone]:[phone]

## 2022-04-27 ENCOUNTER — Telehealth: Payer: Self-pay | Admitting: Orthopaedic Surgery

## 2022-04-27 ENCOUNTER — Ambulatory Visit: Payer: Medicaid Other | Admitting: Orthopaedic Surgery

## 2022-04-27 MED ORDER — CYCLOBENZAPRINE HCL 10 MG PO TABS: 10.0000 mg | ORAL_TABLET | Freq: Every day | ORAL | 1 refills | Status: DC

## 2022-04-27 MED ORDER — HYDROCODONE-ACETAMINOPHEN 5-325 MG PO TABS: ORAL_TABLET | ORAL | 0 refills | Status: DC

## 2022-04-27 NOTE — Telephone Encounter (Signed)
Sent to provider 

## 2022-04-27 NOTE — Telephone Encounter (Signed)
Patient had an appointment this morning, but had to reschedule due to a work conflict.  She has rescheduled for 05/04/22.  She is requesting a refill on Hydrocodone 5-325 and Cyclobenzaprine 10mg  to be sent to [facility] on [address].  Pt's # [phone]

## 2022-05-04 ENCOUNTER — Ambulatory Visit: Payer: Medicaid Other | Admitting: Orthopaedic Surgery

## 2022-05-04 ENCOUNTER — Encounter: Payer: Self-pay | Admitting: Orthopaedic Surgery

## 2022-05-04 DIAGNOSIS — Q6589 Other specified congenital deformities of hip: Secondary | ICD-10-CM | POA: Diagnosis not present

## 2022-05-04 DIAGNOSIS — G8929 Other chronic pain: Secondary | ICD-10-CM | POA: Diagnosis not present

## 2022-05-04 DIAGNOSIS — Z6841 Body Mass Index (BMI) 40.0 and over, adult: Secondary | ICD-10-CM

## 2022-05-04 DIAGNOSIS — M25561 Pain in right knee: Secondary | ICD-10-CM | POA: Diagnosis not present

## 2022-05-04 DIAGNOSIS — I10 Essential (primary) hypertension: Secondary | ICD-10-CM | POA: Diagnosis not present

## 2022-05-04 DIAGNOSIS — M21962 Unspecified acquired deformity of left lower leg: Secondary | ICD-10-CM | POA: Diagnosis not present

## 2022-05-04 DIAGNOSIS — S82192D Other fracture of upper end of left tibia, subsequent encounter for closed fracture with routine healing: Secondary | ICD-10-CM | POA: Diagnosis not present

## 2022-05-04 DIAGNOSIS — Z0001 Encounter for general adult medical examination with abnormal findings: Secondary | ICD-10-CM | POA: Diagnosis not present

## 2022-05-04 DIAGNOSIS — F17200 Nicotine dependence, unspecified, uncomplicated: Secondary | ICD-10-CM | POA: Diagnosis not present

## 2022-05-04 DIAGNOSIS — M85862 Other specified disorders of bone density and structure, left lower leg: Secondary | ICD-10-CM | POA: Diagnosis not present

## 2022-05-04 DIAGNOSIS — M7061 Trochanteric bursitis, right hip: Secondary | ICD-10-CM

## 2022-05-04 DIAGNOSIS — H8110 Benign paroxysmal vertigo, unspecified ear: Secondary | ICD-10-CM | POA: Diagnosis not present

## 2022-05-04 DIAGNOSIS — M222X2 Patellofemoral disorders, left knee: Secondary | ICD-10-CM | POA: Diagnosis not present

## 2022-05-04 DIAGNOSIS — F339 Major depressive disorder, recurrent, unspecified: Secondary | ICD-10-CM | POA: Diagnosis not present

## 2022-05-04 DIAGNOSIS — Z967 Presence of other bone and tendon implants: Secondary | ICD-10-CM | POA: Diagnosis not present

## 2022-05-04 MED ORDER — METHYLPREDNISOLONE ACETATE 40 MG/ML IJ SUSP
40.0000 mg | Freq: Once | INTRAMUSCULAR | Status: AC
  Administered 2022-05-04: 40 mg via INTRA_ARTICULAR

## 2022-05-04 NOTE — Progress Notes (Signed)
PROCEDURE NOTE:  The patient requests injections of the right knee , verbal consent was obtained.  The right knee was prepped appropriately after time out was performed.   Sterile technique was observed and injection of 1 cc of DepoMedrol 40mg  with several cc's of plain xylocaine. Anesthesia was provided by ethyl chloride and a 20-gauge needle was used to inject the knee area. The injection was tolerated well.  A band aid dressing was applied.  The patient was advised to apply ice later today and tomorrow to the injection sight as needed.  PROCEDURE NOTE:  The patient request injection, verbal consent was obtained.  The right trochanteric area of the hip was prepped appropriately after time out was performed.   Sterile technique was observed and injection of 1 cc of DepoMedrol 40 mg with several cc's of plain xylocaine. Anesthesia was provided by ethyl chloride and a 20-gauge needle was used to inject the hip area. The injection was tolerated well.  A band aid dressing was applied.  The patient was advised to apply ice later today and tomorrow to the injection sight as needed.  She was seen at [facility] and she needs a total hip.  She will need to lose some weight first.  Encounter Diagnoses  Name Primary?   Chronic pain of right knee Yes   Trochanteric bursitis, right hip    Congenital dysplasia of right hip    Morbid obesity ([place])    Body mass index 45.0-49.9, adult ([place])    Return in two months.  Call if any problem.  Precautions discussed.  Electronically Signed Sanjuana Kava, MD 1/16/20248:35 AM

## 2022-05-04 NOTE — Addendum Note (Signed)
Addended by: Obie Dredge A on: 05/04/2022 01:27 PM   Modules accepted: Orders

## 2022-06-17 ENCOUNTER — Encounter: Payer: Self-pay | Admitting: Radiology

## 2022-06-19 ENCOUNTER — Other Ambulatory Visit: Payer: Self-pay | Admitting: Orthopaedic Surgery

## 2022-07-06 ENCOUNTER — Encounter: Payer: Self-pay | Admitting: Orthopaedic Surgery

## 2022-07-06 ENCOUNTER — Ambulatory Visit: Payer: Medicaid Other | Admitting: Orthopaedic Surgery

## 2022-07-06 DIAGNOSIS — M7061 Trochanteric bursitis, right hip: Secondary | ICD-10-CM

## 2022-07-06 DIAGNOSIS — M25561 Pain in right knee: Secondary | ICD-10-CM

## 2022-07-06 DIAGNOSIS — G8929 Other chronic pain: Secondary | ICD-10-CM

## 2022-07-06 MED ORDER — METHYLPREDNISOLONE ACETATE 40 MG/ML IJ SUSP
40.0000 mg | Freq: Once | INTRAMUSCULAR | Status: AC
  Administered 2022-07-06: 40 mg via INTRA_ARTICULAR

## 2022-07-06 MED ORDER — CYCLOBENZAPRINE HCL 10 MG PO TABS: 10.0000 mg | ORAL_TABLET | Freq: Every day | ORAL | 1 refills | Status: DC

## 2022-07-06 MED ORDER — TRAMADOL HCL 50 MG PO TABS: ORAL_TABLET | ORAL | 4 refills | Status: DC

## 2022-07-06 NOTE — Progress Notes (Signed)
PROCEDURE NOTE:  The patient requests injections of the right knee , verbal consent was obtained.  The right knee was prepped appropriately after time out was performed.   Sterile technique was observed and injection of 1 cc of DepoMedrol 40mg  with several cc's of plain xylocaine. Anesthesia was provided by ethyl chloride and a 20-gauge needle was used to inject the knee area. The injection was tolerated well.  A band aid dressing was applied.  The patient was advised to apply ice later today and tomorrow to the injection sight as needed.  PROCEDURE NOTE:  The patient request injection, verbal consent was obtained.  The right trochanteric area of the hip was prepped appropriately after time out was performed.   Sterile technique was observed and injection of 1 cc of DepoMedrol 40 mg with several cc's of plain xylocaine. Anesthesia was provided by ethyl chloride and a 20-gauge needle was used to inject the hip area. The injection was tolerated well.  A band aid dressing was applied.  The patient was advised to apply ice later today and tomorrow to the injection sight as needed.  Encounter Diagnoses  Name Primary?   Chronic pain of right knee Yes   Trochanteric bursitis, right hip    I will change to Ultram.  I have reviewed the [place] web site prior to prescribing narcotic medicine for this patient.  Return in two months.  Call if any problem.  Precautions discussed.  Electronically Signed Sanjuana Kava, MD 3/19/20248:22 AM

## 2022-07-06 NOTE — Addendum Note (Signed)
Addended by: Willette Pa on: 07/06/2022 08:25 AM   Modules accepted: Orders

## 2022-08-03 DIAGNOSIS — M21962 Unspecified acquired deformity of left lower leg: Secondary | ICD-10-CM | POA: Diagnosis not present

## 2022-08-03 DIAGNOSIS — S82102D Unspecified fracture of upper end of left tibia, subsequent encounter for closed fracture with routine healing: Secondary | ICD-10-CM | POA: Diagnosis not present

## 2022-08-09 DIAGNOSIS — N390 Urinary tract infection, site not specified: Secondary | ICD-10-CM | POA: Diagnosis not present

## 2022-08-09 DIAGNOSIS — E559 Vitamin D deficiency, unspecified: Secondary | ICD-10-CM | POA: Diagnosis not present

## 2022-08-09 DIAGNOSIS — I10 Essential (primary) hypertension: Secondary | ICD-10-CM | POA: Diagnosis not present

## 2022-08-09 DIAGNOSIS — M129 Arthropathy, unspecified: Secondary | ICD-10-CM | POA: Diagnosis not present

## 2022-08-09 DIAGNOSIS — N399 Disorder of urinary system, unspecified: Secondary | ICD-10-CM | POA: Diagnosis not present

## 2022-09-07 ENCOUNTER — Ambulatory Visit: Payer: Medicaid Other | Admitting: Orthopaedic Surgery

## 2022-09-11 ENCOUNTER — Other Ambulatory Visit: Payer: Self-pay | Admitting: Orthopaedic Surgery

## 2022-09-14 ENCOUNTER — Encounter: Payer: Self-pay | Admitting: Orthopaedic Surgery

## 2022-09-14 ENCOUNTER — Ambulatory Visit (INDEPENDENT_AMBULATORY_CARE_PROVIDER_SITE_OTHER): Payer: Medicaid Other | Admitting: Orthopaedic Surgery

## 2022-09-14 DIAGNOSIS — M7061 Trochanteric bursitis, right hip: Secondary | ICD-10-CM | POA: Diagnosis not present

## 2022-09-14 DIAGNOSIS — M25561 Pain in right knee: Secondary | ICD-10-CM | POA: Diagnosis not present

## 2022-09-14 DIAGNOSIS — G8929 Other chronic pain: Secondary | ICD-10-CM

## 2022-09-14 DIAGNOSIS — F17210 Nicotine dependence, cigarettes, uncomplicated: Secondary | ICD-10-CM

## 2022-09-14 MED ORDER — CYCLOBENZAPRINE HCL 10 MG PO TABS: 10.0000 mg | ORAL_TABLET | Freq: Every day | ORAL | 0 refills | Status: DC

## 2022-09-14 MED ORDER — METHYLPREDNISOLONE ACETATE 40 MG/ML IJ SUSP: 40.0000 mg | Freq: Once | INTRAMUSCULAR | Status: DC

## 2022-09-14 MED ORDER — TRAMADOL HCL 50 MG PO TABS: ORAL_TABLET | ORAL | 4 refills | Status: DC

## 2022-09-14 NOTE — Progress Notes (Signed)
PROCEDURE NOTE:  The patient requests injections of the right knee , verbal consent was obtained.  The right knee was prepped appropriately after time out was performed.   Sterile technique was observed and injection of 1 cc of DepoMedrol 40mg  with several cc's of plain xylocaine. Anesthesia was provided by ethyl chloride and a 20-gauge needle was used to inject the knee area. The injection was tolerated well.  A band aid dressing was applied.  The patient was advised to apply ice later today and tomorrow to the injection sight as needed.  PROCEDURE NOTE:  The patient request injection, verbal consent was obtained.  The right trochanteric area of the hip was prepped appropriately after time out was performed.   Sterile technique was observed and injection of 1 cc of DepoMedrol 40 mg with several cc's of plain xylocaine. Anesthesia was provided by ethyl chloride and a 20-gauge needle was used to inject the hip area. The injection was tolerated well.  A band aid dressing was applied.  The patient was advised to apply ice later today and tomorrow to the injection sight as needed.  Encounter Diagnoses  Name Primary?   Chronic pain of right knee Yes   Trochanteric bursitis, right hip    Nicotine dependence, cigarettes, uncomplicated    I have reviewed the [place] Controlled Substance Reporting System web site prior to prescribing narcotic medicine for this patient.  I refilled her Flexeril also.  Return in two months.  Call if any problem.  Precautions discussed.  Electronically Signed Darreld Mclean, MD 5/28/20248:09 AM

## 2022-09-23 ENCOUNTER — Ambulatory Visit: Payer: Medicaid Other | Admitting: Urology

## 2022-10-06 ENCOUNTER — Telehealth: Payer: Self-pay | Admitting: Orthopaedic Surgery

## 2022-11-16 ENCOUNTER — Encounter: Payer: Self-pay | Admitting: Orthopaedic Surgery

## 2022-11-16 ENCOUNTER — Ambulatory Visit (INDEPENDENT_AMBULATORY_CARE_PROVIDER_SITE_OTHER): Payer: Medicaid Other | Admitting: Orthopaedic Surgery

## 2022-11-16 DIAGNOSIS — G8929 Other chronic pain: Secondary | ICD-10-CM

## 2022-11-16 DIAGNOSIS — R202 Paresthesia of skin: Secondary | ICD-10-CM

## 2022-11-16 DIAGNOSIS — M7061 Trochanteric bursitis, right hip: Secondary | ICD-10-CM | POA: Diagnosis not present

## 2022-11-16 DIAGNOSIS — F17210 Nicotine dependence, cigarettes, uncomplicated: Secondary | ICD-10-CM

## 2022-11-16 DIAGNOSIS — M25561 Pain in right knee: Secondary | ICD-10-CM | POA: Diagnosis not present

## 2022-11-16 MED ORDER — METHYLPREDNISOLONE ACETATE 40 MG/ML IJ SUSP
40.0000 mg | Freq: Once | INTRAMUSCULAR | Status: AC
Start: 2022-11-16 — End: 2022-11-16
  Administered 2022-11-16: 40 mg via INTRA_ARTICULAR

## 2022-11-16 MED ORDER — METHYLPREDNISOLONE ACETATE 40 MG/ML IJ SUSP
40.0000 mg | Freq: Once | INTRAMUSCULAR | Status: AC
  Administered 2022-11-16: 40 mg via INTRA_ARTICULAR

## 2022-11-16 NOTE — Patient Instructions (Signed)
We are referring you to [facility] from [facility] Office address is [address] [place] The phone number is [phone]  The office will call you with an appointment Dr. Newton  

## 2022-11-16 NOTE — Progress Notes (Signed)
PROCEDURE NOTE:  The patient requests injections of the right knee , verbal consent was obtained.  The right knee was prepped appropriately after time out was performed.   Sterile technique was observed and injection of 1 cc of DepoMedrol 40mg  with several cc's of plain xylocaine. Anesthesia was provided by ethyl chloride and a 20-gauge needle was used to inject the knee area. The injection was tolerated well.  A band aid dressing was applied.  The patient was advised to apply ice later today and tomorrow to the injection sight as needed.  PROCEDURE NOTE:  The patient request injection, verbal consent was obtained.  The right trochanteric area of the hip was prepped appropriately after time out was performed.   Sterile technique was observed and injection of 1 cc of DepoMedrol 40 mg with several cc's of plain xylocaine. Anesthesia was provided by ethyl chloride and a 20-gauge needle was used to inject the hip area. The injection was tolerated well.  A band aid dressing was applied.  The patient was advised to apply ice later today and tomorrow to the injection sight as needed.  Encounter Diagnoses  Name Primary?   Paresthesia of both hands Yes   Chronic pain of right knee    Trochanteric bursitis, right hip    Nicotine dependence, cigarettes, uncomplicated    She complains of carpal tunnel bilaterally.  I will get EMGs.    Return in one month.  Call if any problem.  Precautions discussed.  Electronically Signed Darreld Mclean, MD 7/30/20248:12 AM

## 2022-12-10 ENCOUNTER — Encounter: Payer: Medicaid Other | Admitting: Physical Medicine and Rehabilitation

## 2022-12-14 ENCOUNTER — Telehealth: Payer: Self-pay | Admitting: Orthopaedic Surgery

## 2022-12-15 ENCOUNTER — Telehealth: Payer: Self-pay

## 2022-12-15 MED ORDER — TRAMADOL HCL 50 MG PO TABS: ORAL_TABLET | ORAL | 4 refills | Status: DC

## 2022-12-16 NOTE — Telephone Encounter (Signed)
Error

## 2023-01-12 ENCOUNTER — Encounter: Payer: Self-pay | Admitting: Orthopaedic Surgery

## 2023-01-12 ENCOUNTER — Ambulatory Visit: Payer: Medicaid Other | Admitting: Orthopaedic Surgery

## 2023-01-12 DIAGNOSIS — G8929 Other chronic pain: Secondary | ICD-10-CM | POA: Diagnosis not present

## 2023-01-12 DIAGNOSIS — M25561 Pain in right knee: Secondary | ICD-10-CM | POA: Diagnosis not present

## 2023-01-12 DIAGNOSIS — M7061 Trochanteric bursitis, right hip: Secondary | ICD-10-CM | POA: Diagnosis not present

## 2023-01-12 DIAGNOSIS — F17210 Nicotine dependence, cigarettes, uncomplicated: Secondary | ICD-10-CM

## 2023-01-12 NOTE — Progress Notes (Signed)
PROCEDURE NOTE:  The patient requests injections of the right knee , verbal consent was obtained.  The right knee was prepped appropriately after time out was performed.   Sterile technique was observed and injection of 1 cc of DepoMedrol 40mg  with several cc's of plain xylocaine. Anesthesia was provided by ethyl chloride and a 20-gauge needle was used to inject the knee area. The injection was tolerated well.  A band aid dressing was applied.  The patient was advised to apply ice later today and tomorrow to the injection sight as needed.  PROCEDURE NOTE:  The patient request injection, verbal consent was obtained.  The right trochanteric area of the hip was prepped appropriately after time out was performed.   Sterile technique was observed and injection of 1 cc of DepoMedrol 40 mg with several cc's of plain xylocaine. Anesthesia was provided by ethyl chloride and a 20-gauge needle was used to inject the hip area. The injection was tolerated well.  A band aid dressing was applied.  The patient was advised to apply ice later today and tomorrow to the injection sight as needed.  Encounter Diagnoses  Name Primary?   Chronic pain of right knee Yes   Trochanteric bursitis, right hip    Nicotine dependence, cigarettes, uncomplicated    Return in six weeks.  Call if any problem.  Precautions discussed.  Electronically Signed Darreld Mclean, MD 9/25/20248:35 AM

## 2023-01-13 DIAGNOSIS — G8929 Other chronic pain: Secondary | ICD-10-CM | POA: Diagnosis not present

## 2023-01-13 DIAGNOSIS — M7061 Trochanteric bursitis, right hip: Secondary | ICD-10-CM | POA: Diagnosis not present

## 2023-01-13 DIAGNOSIS — M25561 Pain in right knee: Secondary | ICD-10-CM | POA: Diagnosis not present

## 2023-01-13 MED ORDER — METHYLPREDNISOLONE ACETATE 40 MG/ML IJ SUSP
40.0000 mg | Freq: Once | INTRAMUSCULAR | Status: AC
Start: 2023-01-13 — End: 2023-01-13
  Administered 2023-01-13: 40 mg via INTRA_ARTICULAR

## 2023-01-13 NOTE — Addendum Note (Signed)
Addended by: Michaele Offer on: 01/13/2023 04:49 PM   Modules accepted: Orders

## 2023-01-19 MED ORDER — METHYLPREDNISOLONE ACETATE 40 MG/ML IJ SUSP
40.0000 mg | Freq: Once | INTRAMUSCULAR | Status: AC
Start: 2023-01-19 — End: 2023-01-19
  Administered 2023-01-19: 40 mg via INTRA_ARTICULAR

## 2023-01-19 MED ORDER — METHYLPREDNISOLONE ACETATE 40 MG/ML IJ SUSP
40.0000 mg | Freq: Once | INTRAMUSCULAR | Status: DC
Start: 2023-01-19 — End: 2023-01-19

## 2023-01-19 NOTE — Addendum Note (Signed)
Addended by: Michaele Offer on: 01/19/2023 03:13 PM   Modules accepted: Orders

## 2023-02-07 DIAGNOSIS — I10 Essential (primary) hypertension: Secondary | ICD-10-CM | POA: Diagnosis not present

## 2023-02-07 DIAGNOSIS — M129 Arthropathy, unspecified: Secondary | ICD-10-CM | POA: Diagnosis not present

## 2023-02-07 DIAGNOSIS — E782 Mixed hyperlipidemia: Secondary | ICD-10-CM | POA: Diagnosis not present

## 2023-02-07 DIAGNOSIS — Q6589 Other specified congenital deformities of hip: Secondary | ICD-10-CM | POA: Diagnosis not present

## 2023-02-11 DIAGNOSIS — I10 Essential (primary) hypertension: Secondary | ICD-10-CM | POA: Diagnosis not present

## 2023-02-11 DIAGNOSIS — E782 Mixed hyperlipidemia: Secondary | ICD-10-CM | POA: Diagnosis not present

## 2023-02-11 DIAGNOSIS — Z0001 Encounter for general adult medical examination with abnormal findings: Secondary | ICD-10-CM | POA: Diagnosis not present

## 2023-02-11 DIAGNOSIS — E559 Vitamin D deficiency, unspecified: Secondary | ICD-10-CM | POA: Diagnosis not present

## 2023-02-23 ENCOUNTER — Ambulatory Visit: Payer: Medicaid Other | Admitting: Orthopaedic Surgery

## 2023-03-01 ENCOUNTER — Ambulatory Visit: Payer: Medicaid Other | Admitting: Orthopaedic Surgery

## 2023-03-02 ENCOUNTER — Ambulatory Visit (INDEPENDENT_AMBULATORY_CARE_PROVIDER_SITE_OTHER): Payer: Medicaid Other | Admitting: Orthopaedic Surgery

## 2023-03-02 ENCOUNTER — Encounter: Payer: Self-pay | Admitting: Orthopaedic Surgery

## 2023-03-02 DIAGNOSIS — G8929 Other chronic pain: Secondary | ICD-10-CM

## 2023-03-02 DIAGNOSIS — M7061 Trochanteric bursitis, right hip: Secondary | ICD-10-CM | POA: Diagnosis not present

## 2023-03-02 DIAGNOSIS — M25561 Pain in right knee: Secondary | ICD-10-CM | POA: Diagnosis not present

## 2023-03-02 DIAGNOSIS — F17210 Nicotine dependence, cigarettes, uncomplicated: Secondary | ICD-10-CM

## 2023-03-02 MED ORDER — METHYLPREDNISOLONE ACETATE 40 MG/ML IJ SUSP
40.0000 mg | Freq: Once | INTRAMUSCULAR | Status: AC
Start: 2023-03-02 — End: 2023-03-02
  Administered 2023-03-02: 40 mg via INTRA_ARTICULAR

## 2023-03-02 MED ORDER — TRAMADOL HCL 50 MG PO TABS: ORAL_TABLET | ORAL | 4 refills | Status: DC

## 2023-03-02 NOTE — Addendum Note (Signed)
Addended by: Michaele Offer on: 03/02/2023 09:36 AM   Modules accepted: Orders

## 2023-03-02 NOTE — Addendum Note (Signed)
Addended by: Michaele Offer on: 03/02/2023 10:53 AM   Modules accepted: Orders

## 2023-03-02 NOTE — Progress Notes (Signed)
PROCEDURE NOTE:  The patient requests injections of the right knee , verbal consent was obtained.  The right knee was prepped appropriately after time out was performed.   Sterile technique was observed and injection of 1 cc of DepoMedrol 40mg  with several cc's of plain xylocaine. Anesthesia was provided by ethyl chloride and a 20-gauge needle was used to inject the knee area. The injection was tolerated well.  A band aid dressing was applied.  The patient was advised to apply ice later today and tomorrow to the injection sight as needed.  PROCEDURE NOTE:  The patient request injection, verbal consent was obtained.  The right trochanteric area of the hip was prepped appropriately after time out was performed.   Sterile technique was observed and injection of 1 cc of DepoMedrol 40 mg with several cc's of plain xylocaine. Anesthesia was provided by ethyl chloride and a 20-gauge needle was used to inject the hip area. The injection was tolerated well.  A band aid dressing was applied.  The patient was advised to apply ice later today and tomorrow to the injection sight as needed.  Encounter Diagnoses  Name Primary?   Chronic pain of right knee Yes   Trochanteric bursitis, right hip    Nicotine dependence, cigarettes, uncomplicated    I have refilled her tramadol.  Return in about six to seven weeks.  Call if any problem.  Precautions discussed.  Electronically Signed Darreld Mclean, MD 11/13/20249:11 AM

## 2023-03-29 ENCOUNTER — Encounter (HOSPITAL_COMMUNITY): Payer: Self-pay

## 2023-03-29 ENCOUNTER — Other Ambulatory Visit: Payer: Self-pay

## 2023-03-29 ENCOUNTER — Emergency Department (HOSPITAL_COMMUNITY): Payer: Medicaid Other

## 2023-03-29 ENCOUNTER — Emergency Department (HOSPITAL_COMMUNITY)
Admission: EM | Admit: 2023-03-29 | Discharge: 2023-03-29 | Disposition: A | Discharge status: Home / Self Care | Payer: Medicaid Other | Attending: Emergency Medicine | Admitting: Emergency Medicine

## 2023-03-29 DIAGNOSIS — Z79899 Other long term (current) drug therapy: Secondary | ICD-10-CM | POA: Insufficient documentation

## 2023-03-29 DIAGNOSIS — Z87891 Personal history of nicotine dependence: Secondary | ICD-10-CM | POA: Insufficient documentation

## 2023-03-29 DIAGNOSIS — M19072 Primary osteoarthritis, left ankle and foot: Secondary | ICD-10-CM | POA: Diagnosis not present

## 2023-03-29 DIAGNOSIS — I10 Essential (primary) hypertension: Secondary | ICD-10-CM | POA: Insufficient documentation

## 2023-03-29 DIAGNOSIS — M25572 Pain in left ankle and joints of left foot: Secondary | ICD-10-CM | POA: Diagnosis not present

## 2023-03-29 NOTE — ED Provider Notes (Signed)
[name] EMERGENCY DEPARTMENT AT [facility] Provider Note   CSN: [id number] Arrival date & time: 03/29/23  1040     History  Chief Complaint  Patient presents with   Ankle Pain    [name] is a 37 y.o. female.   Ankle Pain   37 year female presents emergency department with complaints of left ankle pain.  Patient reports ankle pain for the past several days.  Reports prior surgical intervention June 2023.  Denies any recent fall/trauma/injuries.  Has been taking over-the-counter medicines as well as tried heat/ice at home with some improvement of symptoms.  Does report some swelling of the inner aspect of her left ankle.  Has been able to walk but with pain.  Concerned about hardware malfunction.  Past medical history significant for arthritis, hypertension  Home Medications Prior to Admission medications   Medication Sig Start Date End Date Taking? Authorizing Provider  acetaminophen (TYLENOL) 325 MG tablet Take 650 mg by mouth every 6 (six) hours as needed.    [provider]  cyclobenzaprine (FLEXERIL) 10 MG tablet TAKE 1 TABLET(10 MG) BY MOUTH EVERY NIGHT AT BEDTIME AS NEEDED FOR SPASM 12/15/22   Darreld Mclean, MD  hydrochlorothiazide (HYDRODIURIL) 12.5 MG tablet Take 12.5 mg by mouth daily. 08/20/20   [provider]  traMADol (ULTRAM) 50 MG tablet One tablet every six hours as needed for pain. 03/02/23   Darreld Mclean, MD      Allergies    Bee venom    Review of Systems   Review of Systems  All other systems reviewed and are negative.   Physical Exam Updated Vital Signs BP 128/76 (BP Location: Right Arm)   Pulse 71   Temp 97.8 F (36.6 C) (Oral)   Resp 20   Ht 5\' 5"  (1.651 m)   Wt (!) 137.4 kg   SpO2 96%   BMI 50.42 kg/m  Physical Exam Vitals and nursing note reviewed.  Constitutional:      General: She is not in acute distress.    Appearance: She is well-developed.  HENT:     Head: Normocephalic and atraumatic.   Eyes:     Conjunctiva/sclera: Conjunctivae normal.  Cardiovascular:     Rate and Rhythm: Normal rate and regular rhythm.     Heart sounds: No murmur heard. Pulmonary:     Effort: Pulmonary effort is normal. No respiratory distress.     Breath sounds: Normal breath sounds.  Abdominal:     Palpations: Abdomen is soft.     Tenderness: There is no abdominal tenderness.  Musculoskeletal:        General: No swelling.     Cervical back: Neck supple.     Comments: Full range of motion of left knee, ankle, digits.  Swelling appreciated medial aspect with some tenderness posterior medial malleolus.  Overlying swelling in area.  Pedal pulses 2+ bilaterally.  No overlying erythema, palpable induration/fluctuance.  Skin:    General: Skin is warm and dry.     Capillary Refill: Capillary refill takes less than 2 seconds.  Neurological:     Mental Status: She is alert.  Psychiatric:        Mood and Affect: Mood normal.     ED Results / Procedures / Treatments   Labs (all labs ordered are listed, but only abnormal results are displayed) Labs Reviewed - No data to display  EKG None  Radiology DG Ankle Complete Left  Result Date: 03/29/2023 CLINICAL DATA:  pain.  Left ankle pain for few days. No known injury. EXAM: LEFT ANKLE COMPLETE - 3+ VIEW COMPARISON:  None Available. FINDINGS: Bone mineralization within normal limits for patient's age. There is a 5-6 mm oval osteochondral defect along the superomedial aspect of the talar dome. No acute fracture or dislocation. No aggressive osseous lesion. Partially seen metallic hardware in the lower tibia. Ankle mortise appears intact. Mild degenerative changes of the ankle joint characterized by osteophyte formation. No focal soft tissue swelling. No radiopaque foreign bodies. IMPRESSION: *No acute osseous abnormality of the left ankle joint. 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 Electronically Signed   By: Jules Schick M.D.   On: 03/29/2023 12:26    Procedures Procedures    Medications Ordered in ED Medications - No data to display  ED Course/ Medical Decision Making/ A&P                                 Medical Decision Making Amount and/or Complexity of Data Reviewed Radiology: ordered.   This patient presents to the ED for concern of ankle pain, this involves an extensive number of treatment options, and is a complaint that carries with it a high risk of complications and morbidity.  The differential diagnosis includes EXTR, strain/pain, dislocation, ligament/tendinous injury, neurovascular, mice, septic arthritis, osteoarthritis, hardware malfunction, other   Co morbidities that complicate the patient evaluation  See HPI   Additional history obtained:  Additional history obtained from EMR External records from outside source obtained and reviewed including hospital records   Lab Tests:  N/a   Imaging Studies ordered:  I ordered imaging studies including left ankle x-ray I independently visualized and interpreted imaging which showed no acute fracture.  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  I personally viewed and interpreted the cardiac monitored which showed an underlying rhythm of: Sinus rhythm   Consultations Obtained:  N/a   Problem List / ED Course / Critical interventions / Medication management  Left ankle pain Reevaluation of the patient showed that the patient stayed the same I have reviewed the patients home medicines and have made adjustments as needed   Social Determinants of Health:  Former cigarette use.  Denies illicit drug use.   Test / Admission - Considered:  Left ankle pain Vitals signs significant for hypertension blood pressure 154/76. Otherwise within normal range and stable throughout visit. Imaging studies significant  for: See above 37 year old female presents emergency department with complaints of left ankle pain for the past several days.  Patient with history of surgical intervention left ankle June 2023.  Regarding current presentation, no traumatic mechanism.  Patient with tenderness most so along posterior medial malleolus with some overlying swelling.  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  No overlying skin changes concerning for secondary infectious process.  No pulse deficits concerning for ischemic limb.  Will give patient cam walker boot as well as recommend use of at home walker to help aid in ambulation.  Recommend rest, ice, elevation, NSAIDs and follow-up with orthopedics in the outpatient setting.  Treatment plan discussed at length with patient and she acknowledged understanding was agreeable to said plan.  Patient overall well-appearing, afebrile in no acute distress. Worrisome signs and symptoms were discussed with the patient, and the patient acknowledged understanding to return to the ED if noticed.  Patient was stable upon discharge.          Final Clinical Impression(s) / ED Diagnoses Final diagnoses:  Left ankle pain, unspecified chronicity    Rx / DC Orders ED Discharge Orders     None         Peter Garter, [place] 03/29/23 1315    Loetta Rough, MD 03/29/23 1500

## 2023-03-29 NOTE — ED Triage Notes (Signed)
LEFT ankle pain Denies falls or trauma Prior screws in place June 2023 Pain 5/10 Denies numbness in toes

## 2023-03-29 NOTE — Discharge Instructions (Addendum)
As discussed, the bones of your x-ray looked okay.  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  Recommend follow-up with your orthopedic doctor regarding this as I think this is most likely causing your pain.  See information attached your discharge papers.  Recommend rest, ice, elevation, NSAIDs for treatment of pain.  Please do not hesitate to return if the worrisome signs and symptoms we discussed become apparent.

## 2023-03-29 NOTE — ED Notes (Signed)
CAM boot applied  Pt aware to use walker at home

## 2023-03-29 NOTE — ED Notes (Signed)
PA at bedside.

## 2023-03-29 NOTE — ED Notes (Signed)
PA wants to talk to pt

## 2023-04-26 ENCOUNTER — Telehealth: Payer: Self-pay | Admitting: Orthopaedic Surgery

## 2023-04-27 ENCOUNTER — Ambulatory Visit: Payer: Medicaid Other | Admitting: Orthopaedic Surgery

## 2023-04-27 ENCOUNTER — Encounter: Payer: Self-pay | Admitting: Orthopaedic Surgery

## 2023-04-27 DIAGNOSIS — M25561 Pain in right knee: Secondary | ICD-10-CM

## 2023-04-27 DIAGNOSIS — M7061 Trochanteric bursitis, right hip: Secondary | ICD-10-CM

## 2023-04-27 DIAGNOSIS — G8929 Other chronic pain: Secondary | ICD-10-CM

## 2023-04-27 MED ORDER — CYCLOBENZAPRINE HCL 10 MG PO TABS: 10.0000 mg | ORAL_TABLET | Freq: Every day | ORAL | 0 refills | Status: DC

## 2023-04-27 MED ORDER — TRAMADOL HCL 50 MG PO TABS: ORAL_TABLET | ORAL | 4 refills | Status: DC

## 2023-04-27 NOTE — Progress Notes (Signed)
 PROCEDURE NOTE:  The patient request injection, verbal consent was obtained.  The right trochanteric area of the hip was prepped appropriately after time out was performed.   Sterile technique was observed and injection of 1 cc of DepoMedrol 40 mg with several cc's of plain xylocaine. Anesthesia was provided by ethyl chloride and a 20-gauge needle was used to inject the hip area. The injection was tolerated well.  A band aid dressing was applied.  The patient was advised to apply ice later today and tomorrow to the injection sight as needed.  PROCEDURE NOTE:  The patient requests injections of the right knee , verbal consent was obtained.  The right knee was prepped appropriately after time out was performed.   Sterile technique was observed and injection of 1 cc of DepoMedrol 40mg  with several cc's of plain xylocaine. Anesthesia was provided by ethyl chloride and a 20-gauge needle was used to inject the knee area. The injection was tolerated well.  A band aid dressing was applied.  The patient was advised to apply ice later today and tomorrow to the injection sight as needed.  Encounter Diagnoses  Name Primary?   Chronic pain of right knee Yes   Trochanteric bursitis, right hip    Return in six weeks.  I have reviewed the [place]  Controlled Substance Reporting System web site prior to prescribing narcotic medicine for this patient.  Call if any problem.  Precautions discussed.  Electronically Signed Lemond Stable, MD 1/8/20258:26 AM

## 2023-04-28 MED ORDER — METHYLPREDNISOLONE ACETATE 40 MG/ML IJ SUSP
40.0000 mg | Freq: Once | INTRAMUSCULAR | Status: AC
  Administered 2023-04-27: 40 mg via INTRA_ARTICULAR

## 2023-04-28 NOTE — Addendum Note (Signed)
 Addended byCaffie Damme on: 04/28/2023 11:24 AM   Modules accepted: Orders

## 2023-05-17 DIAGNOSIS — E782 Mixed hyperlipidemia: Secondary | ICD-10-CM | POA: Diagnosis not present

## 2023-05-17 DIAGNOSIS — F17200 Nicotine dependence, unspecified, uncomplicated: Secondary | ICD-10-CM | POA: Diagnosis not present

## 2023-05-17 DIAGNOSIS — Q6589 Other specified congenital deformities of hip: Secondary | ICD-10-CM | POA: Diagnosis not present

## 2023-05-17 DIAGNOSIS — M129 Arthropathy, unspecified: Secondary | ICD-10-CM | POA: Diagnosis not present

## 2023-05-17 DIAGNOSIS — N390 Urinary tract infection, site not specified: Secondary | ICD-10-CM | POA: Diagnosis not present

## 2023-05-17 DIAGNOSIS — I10 Essential (primary) hypertension: Secondary | ICD-10-CM | POA: Diagnosis not present

## 2023-05-17 DIAGNOSIS — Z0001 Encounter for general adult medical examination with abnormal findings: Secondary | ICD-10-CM | POA: Diagnosis not present

## 2023-06-08 ENCOUNTER — Encounter: Payer: Self-pay | Admitting: Orthopaedic Surgery

## 2023-06-08 ENCOUNTER — Ambulatory Visit (INDEPENDENT_AMBULATORY_CARE_PROVIDER_SITE_OTHER): Payer: Medicaid Other | Admitting: Orthopaedic Surgery

## 2023-06-08 DIAGNOSIS — M7061 Trochanteric bursitis, right hip: Secondary | ICD-10-CM | POA: Diagnosis not present

## 2023-06-08 DIAGNOSIS — M25561 Pain in right knee: Secondary | ICD-10-CM | POA: Diagnosis not present

## 2023-06-08 DIAGNOSIS — G8929 Other chronic pain: Secondary | ICD-10-CM

## 2023-06-08 MED ORDER — METHYLPREDNISOLONE ACETATE 40 MG/ML IJ SUSP
40.0000 mg | Freq: Once | INTRAMUSCULAR | Status: AC
  Administered 2023-06-08: 40 mg via INTRA_ARTICULAR

## 2023-06-08 MED ORDER — TRAMADOL HCL 50 MG PO TABS: ORAL_TABLET | ORAL | 4 refills | Status: DC

## 2023-06-08 NOTE — Progress Notes (Signed)
PROCEDURE NOTE:  The patient request injection, verbal consent was obtained.  The right trochanteric area of the hip was prepped appropriately after time out was performed.   Sterile technique was observed and injection of 1 cc of DepoMedrol 40 mg with several cc's of plain xylocaine. Anesthesia was provided by ethyl chloride and a 20-gauge needle was used to inject the hip area. The injection was tolerated well.  A band aid dressing was applied.  The patient was advised to apply ice later today and tomorrow to the injection sight as needed.  PROCEDURE NOTE:  The patient requests injections of the right knee , verbal consent was obtained.  The right knee was prepped appropriately after time out was performed.   Sterile technique was observed and injection of 1 cc of DepoMedrol 40mg  with several cc's of plain xylocaine. Anesthesia was provided by ethyl chloride and a 20-gauge needle was used to inject the knee area. The injection was tolerated well.  A band aid dressing was applied.  The patient was advised to apply ice later today and tomorrow to the injection sight as needed.  Encounter Diagnoses  Name Primary?   Chronic pain of right knee Yes   Trochanteric bursitis, right hip    Return in seven weeks.  I have reviewed the [place] Controlled Substance Reporting System web site prior to prescribing narcotic medicine for this patient.  Call if any problem.  Precautions discussed.  Electronically Signed Darreld Mclean, MD 2/19/20258:11 AM

## 2023-06-08 NOTE — Addendum Note (Signed)
Addended by: Michaele Offer on: 06/08/2023 09:13 AM   Modules accepted: Orders

## 2023-06-21 ENCOUNTER — Ambulatory Visit: Admitting: Orthopedic Surgery

## 2023-06-21 ENCOUNTER — Encounter: Payer: Self-pay | Admitting: Orthopedic Surgery

## 2023-06-21 VITALS — BP 129/84 | HR 73 | Ht 65.0 in | Wt 311.0 lb

## 2023-06-21 DIAGNOSIS — M93272 Osteochondritis dissecans, left ankle and joints of left foot: Secondary | ICD-10-CM | POA: Diagnosis not present

## 2023-06-21 DIAGNOSIS — M2142 Flat foot [pes planus] (acquired), left foot: Secondary | ICD-10-CM

## 2023-06-21 NOTE — Patient Instructions (Signed)

## 2023-06-21 NOTE — Progress Notes (Signed)
 New Patient Visit  Assessment: [name] is a 38 y.o. female with the following: 1. Pes planus of left foot 2. Osteochondritis dissecans of ankle, left  Plan: Retta L Acoff has pain in the left ankle, as well as the left heel.  She was evaluated emergency department, and there was some concern for an OCD lesion noted on radiographs.  However, her current pain is more likely related to her flatfoot deformity.  She is complaining of pain along the posterior tibialis tendon medially, as well as peroneal tendons laterally.  She has no tenderness along the tibiotalar joint.  No swelling of the foot or ankle.  Radiographs were reviewed in clinic.  I discussed the findings.  I would recommend physical therapy.  She would like to try and do exercises for her ankle on her own.  Home exercise program has been provided.  She can continue to try using the boot as needed.  Medications as needed.  Return to clinic as needed.  Follow-up: Return if symptoms worsen or fail to improve.  Subjective:  Chief Complaint  Patient presents with   Ankle Pain    L and heel for 2-3 mos. Wore a boot and pain got better after a few days. Still wears boot on and off for pain.     History of Present Illness: BRITNE BORELLI is a 38 y.o. female who presents for evaluation of left ankle pain.  She reports that she had progressively worsening left ankle pain, greater than 2 months ago.  She was evaluated in the emergency department.  Radiographs noted a lesion in the talar dome.  She wore a walking boot for a while, and this improved her symptoms.  However, after stopping using the boot, she noted recurrence of the symptoms.  She has known flatfoot.  She also has a history of a tibial osteotomy, completed greater than 2 years ago.  No prior injuries to her left ankle.  She is not taking medicines for her left ankle.   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   CESAREAN SECTION     COLPOSCOPY      Family History  Problem Relation Age of Onset   Cancer Other    Diabetes Other    Thyroid disease Mother    Hypertension Mother    Heart attack Mother    Cancer Maternal Grandmother    Asthma Maternal Grandmother    Arthritis Maternal Grandmother    Cancer Maternal Grandfather    Diabetes Maternal Grandfather    Thyroid disease Maternal Aunt    Social History   Tobacco Use   Smoking status: Former    Current packs/day: 0.00    Average packs/day: 0.3 packs/day for 2.0 years (0.5 ttl pk-yrs)    Types: Cigarettes    Start date: 12/10/2018    Quit date: 12/09/2020    Years since quitting: 2.5   Smokeless tobacco: Never   Tobacco comments:    "2-3 cigs per day"  Vaping Use  Vaping status: Never Used  Substance Use Topics   Alcohol use: No   Drug use: No    Allergies  Allergen Reactions   Bee Venom Swelling    Current Meds  Medication Sig   acetaminophen (TYLENOL) 325 MG tablet Take 650 mg by mouth every 6 (six) hours as needed.   cyclobenzaprine (FLEXERIL) 10 MG tablet Take 1 tablet (10 mg total) by mouth at bedtime.   hydrochlorothiazide (HYDRODIURIL) 12.5 MG tablet Take 12.5 mg by mouth daily.   traMADol (ULTRAM) 50 MG tablet One tablet every six hours as needed for pain.    Objective: BP 129/84   Pulse 73   Ht 5\' 5"  (1.651 m)   Wt (!) 311 lb (141.1 kg)   BMI 51.75 kg/m   Physical Exam:  General: Alert and oriented. and No acute distress. Gait: Left sided antalgic gait.  Evaluation left foot demonstrates a pes planus deformity.  She is able to get to a plantigrade  position.  Tenderness palpation along the posterior tibialis tendon.  Tenderness palpation along peroneal tendons.  Negative anterior drawer.  No increased pain with inversion or eversion.  No tenderness to palpation along the anterior aspect of the tibiotalar joint.  IMAGING: I personally reviewed images previously obtained from the ED  X-rays left ankle were previously obtained.  Hardware from the tibial nail is intact.  In the medial aspect of the talar dome, there is a small osteochondral lesion.  No obvious displacement.   New Medications:  No orders of the defined types were placed in this encounter.     Oliver Barre, MD  06/21/2023 11:56 AM

## 2023-06-22 ENCOUNTER — Encounter: Admitting: Orthopedic Surgery

## 2023-07-13 ENCOUNTER — Encounter: Payer: Self-pay | Admitting: Orthopaedic Surgery

## 2023-07-13 ENCOUNTER — Ambulatory Visit (INDEPENDENT_AMBULATORY_CARE_PROVIDER_SITE_OTHER): Admitting: Orthopaedic Surgery

## 2023-07-13 DIAGNOSIS — M7061 Trochanteric bursitis, right hip: Secondary | ICD-10-CM

## 2023-07-13 DIAGNOSIS — G8929 Other chronic pain: Secondary | ICD-10-CM | POA: Diagnosis not present

## 2023-07-13 DIAGNOSIS — M25561 Pain in right knee: Secondary | ICD-10-CM

## 2023-07-13 DIAGNOSIS — Z6841 Body Mass Index (BMI) 40.0 and over, adult: Secondary | ICD-10-CM

## 2023-07-13 MED ORDER — TRAMADOL HCL 50 MG PO TABS: ORAL_TABLET | ORAL | 4 refills | Status: DC

## 2023-07-13 MED ORDER — METHYLPREDNISOLONE ACETATE 40 MG/ML IJ SUSP
40.0000 mg | Freq: Once | INTRAMUSCULAR | Status: AC
  Administered 2023-07-13: 40 mg via INTRA_ARTICULAR

## 2023-07-13 NOTE — Progress Notes (Signed)
 PROCEDURE NOTE:  The patient requests injections of the right knee , verbal consent was obtained.  The right knee was prepped appropriately after time out was performed.   Sterile technique was observed and injection of 1 cc of DepoMedrol 40mg  with several cc's of plain xylocaine. Anesthesia was provided by ethyl chloride and a 20-gauge needle was used to inject the knee area. The injection was tolerated well.  A band aid dressing was applied.  The patient was advised to apply ice later today and tomorrow to the injection sight as needed.  PROCEDURE NOTE:  The patient request injection, verbal consent was obtained.  The right trochanteric area of the hip was prepped appropriately after time out was performed.   Sterile technique was observed and injection of 1 cc of DepoMedrol 40 mg with several cc's of plain xylocaine. Anesthesia was provided by ethyl chloride and a 20-gauge needle was used to inject the hip area. The injection was tolerated well.  A band aid dressing was applied.  The patient was advised to apply ice later today and tomorrow to the injection sight as needed.  Encounter Diagnoses  Name Primary?   Chronic pain of right knee Yes   Trochanteric bursitis, right hip    Morbid obesity (HCC)    Body mass index 45.0-49.9, adult (HCC)    I have reviewed the [place] Controlled Substance Reporting System web site prior to prescribing narcotic medicine for this patient.  Return in six weeks.  Call if any problem.  Precautions discussed.  Electronically Signed Darreld Mclean, MD 3/26/20258:40 AM

## 2023-07-13 NOTE — Addendum Note (Signed)
 Addended by: Elvina Mattes T on: 07/13/2023 04:37 PM   Modules accepted: Orders

## 2023-07-27 ENCOUNTER — Ambulatory Visit: Payer: Medicaid Other | Admitting: Orthopaedic Surgery

## 2023-08-02 DIAGNOSIS — E782 Mixed hyperlipidemia: Secondary | ICD-10-CM | POA: Diagnosis not present

## 2023-08-02 DIAGNOSIS — Z0001 Encounter for general adult medical examination with abnormal findings: Secondary | ICD-10-CM | POA: Diagnosis not present

## 2023-08-02 DIAGNOSIS — R739 Hyperglycemia, unspecified: Secondary | ICD-10-CM | POA: Diagnosis not present

## 2023-08-02 DIAGNOSIS — N390 Urinary tract infection, site not specified: Secondary | ICD-10-CM | POA: Diagnosis not present

## 2023-08-24 ENCOUNTER — Encounter: Payer: Self-pay | Admitting: Orthopaedic Surgery

## 2023-08-24 ENCOUNTER — Ambulatory Visit (INDEPENDENT_AMBULATORY_CARE_PROVIDER_SITE_OTHER): Admitting: Orthopaedic Surgery

## 2023-08-24 DIAGNOSIS — M7061 Trochanteric bursitis, right hip: Secondary | ICD-10-CM | POA: Diagnosis not present

## 2023-08-24 DIAGNOSIS — G8929 Other chronic pain: Secondary | ICD-10-CM

## 2023-08-24 DIAGNOSIS — M25561 Pain in right knee: Secondary | ICD-10-CM

## 2023-08-24 DIAGNOSIS — Z6841 Body Mass Index (BMI) 40.0 and over, adult: Secondary | ICD-10-CM

## 2023-08-24 MED ORDER — METHYLPREDNISOLONE ACETATE 40 MG/ML IJ SUSP
40.0000 mg | Freq: Once | INTRAMUSCULAR | Status: AC
  Administered 2023-08-24: 40 mg via INTRA_ARTICULAR

## 2023-08-24 NOTE — Progress Notes (Signed)
 PROCEDURE NOTE:  The patient requests injections of the right knee , verbal consent was obtained.  The right knee was prepped appropriately after time out was performed.   Sterile technique was observed and injection of 1 cc of DepoMedrol 40mg  with several cc's of plain xylocaine. Anesthesia was provided by ethyl chloride and a 20-gauge needle was used to inject the knee area. The injection was tolerated well.  A band aid dressing was applied.  The patient was advised to apply ice later today and tomorrow to the injection sight as needed.  PROCEDURE NOTE:  The patient request injection, verbal consent was obtained.  The right trochanteric area of the hip was prepped appropriately after time out was performed.   Sterile technique was observed and injection of 1 cc of DepoMedrol 40 mg with several cc's of plain xylocaine. Anesthesia was provided by ethyl chloride and a 20-gauge needle was used to inject the hip area. The injection was tolerated well.  A band aid dressing was applied.  The patient was advised to apply ice later today and tomorrow to the injection sight as needed.  Encounter Diagnoses  Name Primary?   Chronic pain of right knee Yes   Trochanteric bursitis, right hip    Morbid obesity (HCC)    Body mass index 45.0-49.9, adult (HCC)    Return in six weeks.  Call if any problem.  Precautions discussed.  Electronically Signed Pleasant Brilliant, MD 5/7/20258:26 AM

## 2023-08-24 NOTE — Addendum Note (Signed)
 Addended by: [place] Snow T on: 08/24/2023 10:30 AM   Modules accepted: Orders

## 2023-09-08 NOTE — Progress Notes (Unsigned)
 Name: [name] DOB: February 05, 1986 MRN: [id number]  History of Present Illness: Ms. [name] is a 38 y.o. female who presents today for follow up visit at [facility] [place].  GU History includes: 1. Recurrent UTIs. 2. OAB with frequency, urgency, nocturia, occasional urge incontinence. 3. Kidney stones. Passed spontaneously. 4. Bladder reconstruction for apparent bladder exstrophy. Performed at [facility] around age [age] years.   At last visit with Dr. Willye Morales on 08/17/2021: [name] prevention discussed with the patient. She does not wish to proceed with cystoscopy at this time. I again discussed the importance of obtaining a urine culture prior to any treatment for UTI symptoms. Return to office in 2 months."  Today: She denies recent episode of stone pain / passage. She denies flank pain and abdominal pain.   She reports some concern for possible early UTI due increased urinary frequency over the past few days and "slight" pressure at her urethra. Denies increased urinary urgency, nocturia, dysuria, gross hematuria, hesitancy, straining to void, or sensations of incomplete emptying.  Reports 1-2 UTIs in the past 12 months. Was evaluated and treated for that by her PCP.   Reports nocturia x3, which she states is not significantly bothersome. Has some urge incontinence overnight; denies nocturnal enuresis.   Medications: Current Outpatient Medications  Medication Sig Dispense Refill   acetaminophen  (TYLENOL ) 325 MG tablet Take 650 mg by mouth every 6 (six) hours as needed.     cyclobenzaprine  (FLEXERIL ) 10 MG tablet Take 1 tablet (10 mg total) by mouth at bedtime. 30 tablet 0   traMADol  (ULTRAM ) 50 MG tablet One tablet every six hours as needed for pain. 20 tablet 4   No current facility-administered medications for this visit.    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   CESAREAN SECTION     COLPOSCOPY     Family History  Problem Relation Age of Onset   Cancer Other    Diabetes Other    Thyroid  disease Mother    Hypertension Mother    Heart attack Mother    Cancer Maternal Grandmother    Asthma Maternal Grandmother    Arthritis Maternal Grandmother    Cancer Maternal Grandfather    Diabetes Maternal Grandfather    Thyroid  disease Maternal Aunt    Social History   Socioeconomic History   Marital status: Single    Spouse name: Not on file   Number of children: 3   Years of education: Not on file   Highest education level: Not on file  Occupational History   Not on file  Tobacco Use   Smoking status: Former    Current packs/day: 0.00    Average packs/day: 0.3 packs/day for 2.0 years (0.5 ttl pk-yrs)    Types: Cigarettes    Start date: 12/10/2018    Quit date: 12/09/2020    Years since quitting: 2.7   Smokeless tobacco: Never   Tobacco comments:    "2-3 cigs per day"  Vaping Use   Vaping status: Never Used  Substance and Sexual Activity   Alcohol use: No   Drug use: No   Sexual activity: Yes  Birth control/protection: None  Other Topics Concern   Not on file  Social History Narrative   Not on file   Social Drivers of Health   Financial Resource Strain: Low Risk  (11/08/2019)   Overall Financial Resource Strain (CARDIA)    Difficulty of Paying Living Expenses: Not very hard  Food Insecurity: No Food Insecurity (11/08/2019)   Hunger Vital Sign    Worried About Running Out of Food in the Last Year: Never true    Ran Out of Food in the Last Year: Never true  Transportation Needs: No Transportation Needs (11/08/2019)   PRAPARE - Administrator, Civil Service (Medical): No    Lack of Transportation (Non-Medical): No   Physical Activity: Insufficiently Active (11/08/2019)   Exercise Vital Sign    Days of Exercise per Week: 2 days    Minutes of Exercise per Session: 10 min  Stress: No Stress Concern Present (11/08/2019)   [employer] of Occupational Health - Occupational Stress Questionnaire    Feeling of Stress : Only a little  Social Connections: Socially Isolated (11/08/2019)   Social Connection and Isolation Panel [NHANES]    Frequency of Communication with Friends and Family: Once a week    Frequency of Social Gatherings with Friends and Family: Once a week    Attends Religious Services: 1 to 4 times per year    Active Member of [employer] or Organizations: No    Attends Banker Meetings: Never    Marital Status: Never married  Intimate Partner Violence: Not At Risk (11/08/2019)   Humiliation, Afraid, Rape, and Kick questionnaire    Fear of Current or Ex-Partner: No    Emotionally Abused: No    Physically Abused: No    Sexually Abused: No    Review of Systems Constitutional: Patient denies any unintentional weight loss or change in strength lntegumentary: Patient denies any rashes or pruritus Cardiovascular: Patient denies chest pain or syncope Respiratory: Patient denies shortness of breath Gastrointestinal: As per HPI Musculoskeletal: Patient denies muscle cramps or weakness Neurologic: Patient denies convulsions or seizures Allergic/Immunologic: Patient denies recent allergic reaction(s) Hematologic/Lymphatic: Patient denies bleeding tendencies Endocrine: Patient denies heat/cold intolerance  GU: As per HPI.  OBJECTIVE Vitals:   09/14/23 1106  BP: (!) 158/88  Pulse: 76   There is no height or weight on file to calculate BMI.  Physical Examination Constitutional: No obvious distress; patient is non-toxic appearing  Cardiovascular: No visible lower extremity edema.  Respiratory: The patient does not have audible wheezing/stridor; respirations do not appear labored   Gastrointestinal: Abdomen non-distended Musculoskeletal: Normal ROM of UEs  Skin: No obvious rashes/open sores  Neurologic: CN 2-12 grossly intact Psychiatric: Answered questions appropriately with normal affect  Hematologic/Lymphatic/Immunologic: No obvious bruises or sites of spontaneous bleeding  Urine microscopy: 6-10 WBC/hpf, 0-2 RBC/hpf, many bacteria PVR: 143 ml  ASSESSMENT Frequent UTI - Plan: Urinalysis, Routine w reflex microscopic, BLADDER SCAN AMB NON-IMAGING, Urine Culture  Nephrolithiasis - Plan: DG Abd 1 View, DG Abd 1 View  History of exstrophy of bladder  UTI symptoms  She is doing well overall with mild concern for UTI today. Will check urine culture and treat as indicated based on results. Discussed UTI prevention; handout provided.  We agreed to obtain KUB for stone surveillance. We agreed to plan for follow up in 1 year or sooner if needed. Patient verbalized understanding of and agreement with current plan. All questions were answered.  PLAN Advised the following: 1. Requesting prior urine  culture results from PCP for review. 2. Urine culture. 3. KUB. 4. Return in about 1 year (around 09/13/2024) for KUB, UA, PVR, & f/u with Griselda Lederer NP.  Orders Placed This Encounter  Procedures   Urine Culture   DG Abd 1 View    Standing Status:   Future    Expected Date:   09/14/2023    Expiration Date:   09/13/2024    Reason for Exam (SYMPTOM  OR DIAGNOSIS REQUIRED):   kidney stone    Is patient pregnant?:   No    Preferred imaging location?:   [facility]   DG Abd 1 View    Standing Status:   Future    Expected Date:   09/13/2024    Expiration Date:   09/13/2024    Reason for Exam (SYMPTOM  OR DIAGNOSIS REQUIRED):   kidney stone    Is patient pregnant?:   Unknown (Please Explain)    Preferred imaging location?:   [facility]   Urinalysis, Routine w reflex microscopic   BLADDER SCAN AMB NON-IMAGING    It has been explained that the patient  is to follow regularly with their PCP in addition to all other providers involved in their care and to follow instructions provided by these respective offices. Patient advised to contact urology clinic if any urologic-pertaining questions, concerns, new symptoms or problems arise in the interim period.  Patient Instructions  UTI prevention / management:  Difference between Urinalysis (urine dipstick test) and Urine culture:  Urinalysis (urine dipstick test): A quick office test used as an indicator to determine whether or not further testing is necessary (such as a urine culture, urine microscopy, etc.) The urinalysis cannot differentiate a true bacterial UTI or give a definitive diagnosis for the findings.  Urine culture: May be performed based on the findings of a urinalysis to evaluate for UTI. Grows out on a petri dish for 48-72 hours. Provides important information about: whether or not bacterial growth is present and if so: what the predominant bacteria is which antibiotics will work best against that bacteria That information is important so that we can diagnose and treat patients appropriately as there are other conditions which may mimic UTls which must not be missed (such as cancer, interstitial cystitis, stones, etc.). Assists us  with antibiotic stewardship to minimize patient's risk for developing antibiotic resistance (getting to a point where no antibiotics work anymore).  Options when UTI symptoms occur: 1. Call [facility] [place] and request to speak with triage nurse (phone # [phone], select option 3). In accordance with clinic guidelines the nurse will determine next steps based on patient-reported symptoms, which may include: same-day lab visit to provide urine specimen, recommendation to schedule Urology office visit appointment for further evaluation, recommendation to proceed to ER, etc. 2. Call your Primary Care Provider (PCP) office to request urgent /  same-day visit. Be sure to request for urine culture to be ordered and have results faxed to Urology (fax # [phone]).  3. Go to urgent care. Be sure to request for urine culture to be ordered and have results faxed to Urology (fax # [phone]).   For bladder pain/ burning with urination: - Can take over-the-counter Pyridium  (phenazopyridine ; commonly known under the "AZO" brand) for a few days as needed. Limit use to no more than 3 days consecutively due to risk for methemoglobinemia, liver function issues, and bone health damage with long term use of Pyridium . - Alternative: Prescription urinary analgesics (such as  Uribel, Urogesic blue, Urelle, Uro-MP). Often expensive / poorly covered by insurance unfortunately.  Options / recommendations for UTI prevention: - Adequate daily fluid intake to flush out the urinary tract. - Go to the bathroom to urinate every 4-6 hours while awake to minimize urinary stasis / bacterial overgrowth in the bladder. - Proanthocyanidin (PAC) supplement 36 mg daily; must be soluble (insoluble form of PAC will be ineffective). Recommended brand: Ellura. This is an over-the-counter supplement (often must be found/ purchased online) supplement derived from cranberries with concentrated active component: Proanthocyanidin (PAC) 36 mg daily. Decreases bacterial adherence to bladder lining.  - D-mannose powder (2 grams daily). This is an over-the-counter supplement which decreases bacterial adherence to bladder lining (it is a sugar that inhibits bacterial adherence to urothelial cells by binding to the pili of enteric bacteria). Take as per manufacturer recommendation. Can be used as an alternative or in addition to the concentrated cranberry supplement.  - Vitamin C supplement to acidify urine to minimize bacterial growth.  - Probiotic to maintain healthy vaginal microbiome to suppress bacteria at urethral opening. Brand recommendations: Estill Hemming (includes probiotic &  D-mannose ), Feminine Balance (highest concentration of lactobacillus) or Hyperbiotic Pro 15.  Note for patients with diabetes:  - Be aware that D-mannose contains sugar.  Electronically signed by:  Lauretta Ponto, FNP   09/14/23    11:52 AM

## 2023-09-14 ENCOUNTER — Ambulatory Visit: Admitting: Urology

## 2023-09-14 ENCOUNTER — Encounter: Payer: Self-pay | Admitting: Urology

## 2023-09-14 VITALS — BP 158/88 | HR 76

## 2023-09-14 DIAGNOSIS — Z87718 Personal history of other specified (corrected) congenital malformations of genitourinary system: Secondary | ICD-10-CM | POA: Diagnosis not present

## 2023-09-14 DIAGNOSIS — N200 Calculus of kidney: Secondary | ICD-10-CM | POA: Diagnosis not present

## 2023-09-14 DIAGNOSIS — N390 Urinary tract infection, site not specified: Secondary | ICD-10-CM | POA: Diagnosis not present

## 2023-09-14 DIAGNOSIS — R399 Unspecified symptoms and signs involving the genitourinary system: Secondary | ICD-10-CM

## 2023-09-14 LAB — BLADDER SCAN AMB NON-IMAGING: Scan Result: 143

## 2023-09-14 LAB — MICROSCOPIC EXAMINATION: Epithelial Cells (non renal): >10 /HPF — AB (ref 0–10)

## 2023-09-14 LAB — URINALYSIS, ROUTINE W REFLEX MICROSCOPIC
Bilirubin, UA: NEGATIVE
Glucose, UA: NEGATIVE
Ketones, UA: NEGATIVE
Nitrite, UA: NEGATIVE
Protein,UA: NEGATIVE
RBC, UA: NEGATIVE
Specific Gravity, UA: 1.015 (ref 1.005–1.030)
Urobilinogen, Ur: 0.2 mg/dL (ref 0.2–1.0)
pH, UA: 7 (ref 5.0–7.5)

## 2023-09-14 NOTE — Patient Instructions (Signed)
 UTI prevention / management:  Difference between Urinalysis (urine dipstick test) and Urine culture:  Urinalysis (urine dipstick test): A quick office test used as an indicator to determine whether or not further testing is necessary (such as a urine culture, urine microscopy, etc.) The urinalysis cannot differentiate a true bacterial UTI or give a definitive diagnosis for the findings.  Urine culture: May be performed based on the findings of a urinalysis to evaluate for UTI. Grows out on a petri dish for 48-72 hours. Provides important information about: whether or not bacterial growth is present and if so: what the predominant bacteria is which antibiotics will work best against that bacteria That information is important so that we can diagnose and treat patients appropriately as there are other conditions which may mimic UTls which must not be missed (such as cancer, interstitial cystitis, stones, etc.). Assists [place] with antibiotic stewardship to minimize patient's risk for developing antibiotic resistance (getting to a point where no antibiotics work anymore).  Options when UTI symptoms occur: 1. Call [facility] [place] and request to speak with triage nurse (phone # [phone], select option 3). In accordance with clinic guidelines the nurse will determine next steps based on patient-reported symptoms, which may include: same-day lab visit to provide urine specimen, recommendation to schedule Urology office visit appointment for further evaluation, recommendation to proceed to ER, etc. 2. Call your Primary Care Provider (PCP) office to request urgent / same-day visit. Be sure to request for urine culture to be ordered and have results faxed to Urology (fax # [phone]).  3. Go to urgent care. Be sure to request for urine culture to be ordered and have results faxed to Urology (fax # [phone]).   For bladder pain/ burning with urination: - Can take over-the-counter  Pyridium (phenazopyridine; commonly known under the "AZO" brand) for a few days as needed. Limit use to no more than 3 days consecutively due to risk for methemoglobinemia, liver function issues, and bone health damage with long term use of Pyridium. - Alternative: Prescription urinary analgesics (such as Uribel, Urogesic blue, Urelle, Uro-MP). Often expensive / poorly covered by insurance unfortunately.  Options / recommendations for UTI prevention: - Adequate daily fluid intake to flush out the urinary tract. - Go to the bathroom to urinate every 4-6 hours while awake to minimize urinary stasis / bacterial overgrowth in the bladder. - Proanthocyanidin (PAC) supplement 36 mg daily; must be soluble (insoluble form of PAC will be ineffective). Recommended brand: Ellura. This is an over-the-counter supplement (often must be found/ purchased online) supplement derived from cranberries with concentrated active component: Proanthocyanidin (PAC) 36 mg daily. Decreases bacterial adherence to bladder lining.  - D-mannose powder (2 grams daily). This is an over-the-counter supplement which decreases bacterial adherence to bladder lining (it is a sugar that inhibits bacterial adherence to urothelial cells by binding to the pili of enteric bacteria). Take as per manufacturer recommendation. Can be used as an alternative or in addition to the concentrated cranberry supplement.  - Vitamin C supplement to acidify urine to minimize bacterial growth.  - Probiotic to maintain healthy vaginal microbiome to suppress bacteria at urethral opening. Brand recommendations: Darrold Junker (includes probiotic & D-mannose ), Feminine Balance (highest concentration of lactobacillus) or Hyperbiotic Pro 15.  Note for patients with diabetes:  - Be aware that D-mannose contains sugar.

## 2023-09-16 ENCOUNTER — Ambulatory Visit: Payer: Self-pay | Admitting: Urology

## 2023-09-16 LAB — URINE CULTURE

## 2023-09-18 ENCOUNTER — Other Ambulatory Visit: Payer: Self-pay | Admitting: Orthopaedic Surgery

## 2023-09-22 ENCOUNTER — Telehealth: Payer: Self-pay

## 2023-09-22 NOTE — Telephone Encounter (Signed)
 Tried calling patient to get clarification without an answer, left vm for patient to return call "I feel as tho the symptoms of  a UTI is becoming worst upon appointment."

## 2023-09-26 ENCOUNTER — Telehealth: Payer: Self-pay | Admitting: Orthopaedic Surgery

## 2023-09-26 NOTE — Telephone Encounter (Signed)
 Dr. Vicente Graham pt - pt is wanting to come and see Dr. Linnell Richardson this Wed for lt knee popping and pain.  She had surgery 10/08/21 or 2024, she couldn't remember.  Dr. Jason Halvorson in [place] did her surgery.  She stated he is all the way in [place] and she has no way to get there.  Please advise if you'll see or not.  [phone]

## 2023-09-27 NOTE — Telephone Encounter (Signed)
 Called and advised Dr. Anell Baptist recommendations, she didn't like it, but verbalized understanding.

## 2023-10-05 ENCOUNTER — Ambulatory Visit (INDEPENDENT_AMBULATORY_CARE_PROVIDER_SITE_OTHER): Admitting: Orthopaedic Surgery

## 2023-10-05 ENCOUNTER — Encounter: Payer: Self-pay | Admitting: Orthopaedic Surgery

## 2023-10-05 DIAGNOSIS — G8929 Other chronic pain: Secondary | ICD-10-CM

## 2023-10-05 DIAGNOSIS — M25561 Pain in right knee: Secondary | ICD-10-CM | POA: Diagnosis not present

## 2023-10-05 DIAGNOSIS — M7061 Trochanteric bursitis, right hip: Secondary | ICD-10-CM | POA: Diagnosis not present

## 2023-10-05 DIAGNOSIS — Z6841 Body Mass Index (BMI) 40.0 and over, adult: Secondary | ICD-10-CM

## 2023-10-05 MED ORDER — METHYLPREDNISOLONE ACETATE 40 MG/ML IJ SUSP
40.0000 mg | Freq: Once | INTRAMUSCULAR | Status: AC
  Administered 2023-10-05: 40 mg via INTRA_ARTICULAR

## 2023-10-05 NOTE — Progress Notes (Signed)
 PROCEDURE NOTE:  The patient request injection, verbal consent was obtained.  The right trochanteric area of the hip was prepped appropriately after time out was performed.   Sterile technique was observed and injection of 1 cc of DepoMedrol 40 mg with several cc's of plain xylocaine. Anesthesia was provided by ethyl chloride and a 20-gauge needle was used to inject the hip area. The injection was tolerated well.  A band aid dressing was applied.  The patient was advised to apply ice later today and tomorrow to the injection sight as needed.  PROCEDURE NOTE:  The patient requests injections of the right knee , verbal consent was obtained.  The right knee was prepped appropriately after time out was performed.   Sterile technique was observed and injection of 1 cc of DepoMedrol 40mg  with several cc's of plain xylocaine. Anesthesia was provided by ethyl chloride and a 20-gauge needle was used to inject the knee area. The injection was tolerated well.  A band aid dressing was applied.  The patient was advised to apply ice later today and tomorrow to the injection sight as needed.  Encounter Diagnoses  Name Primary?   Chronic pain of right knee Yes   Trochanteric bursitis, right hip    Morbid obesity (HCC)    Body mass index 45.0-49.9, adult (HCC)    Return in six weeks.  Call if any problem.  Precautions discussed.  Electronically Signed Pleasant Brilliant, MD 6/18/20259:11 AM

## 2023-10-05 NOTE — Addendum Note (Signed)
 Addended by: Maryland Snow T on: 10/05/2023 02:05 PM   Modules accepted: Orders

## 2023-11-16 ENCOUNTER — Ambulatory Visit (INDEPENDENT_AMBULATORY_CARE_PROVIDER_SITE_OTHER): Admitting: Orthopaedic Surgery

## 2023-11-16 ENCOUNTER — Encounter: Payer: Self-pay | Admitting: Orthopaedic Surgery

## 2023-11-16 DIAGNOSIS — M25561 Pain in right knee: Secondary | ICD-10-CM

## 2023-11-16 DIAGNOSIS — M7061 Trochanteric bursitis, right hip: Secondary | ICD-10-CM | POA: Diagnosis not present

## 2023-11-16 DIAGNOSIS — G8929 Other chronic pain: Secondary | ICD-10-CM

## 2023-11-16 DIAGNOSIS — Z6841 Body Mass Index (BMI) 40.0 and over, adult: Secondary | ICD-10-CM

## 2023-11-16 MED ORDER — METHYLPREDNISOLONE ACETATE 40 MG/ML IJ SUSP
40.0000 mg | Freq: Once | INTRAMUSCULAR | Status: AC
  Administered 2023-11-16: 40 mg via INTRA_ARTICULAR

## 2023-11-16 NOTE — Progress Notes (Signed)
 PROCEDURE NOTE:  The patient requests injections of the right knee , verbal consent was obtained.  The right knee was prepped appropriately after time out was performed.   Sterile technique was observed and injection of 1 cc of DepoMedrol 40mg  with several cc's of plain xylocaine. Anesthesia was provided by ethyl chloride and a 20-gauge needle was used to inject the knee area. The injection was tolerated well.  A band aid dressing was applied.  The patient was advised to apply ice later today and tomorrow to the injection sight as needed.  PROCEDURE NOTE:  The patient request injection, verbal consent was obtained.  The right trochanteric area of the hip was prepped appropriately after time out was performed.   Sterile technique was observed and injection of 1 cc of DepoMedrol 40 mg with several cc's of plain xylocaine. Anesthesia was provided by ethyl chloride and a 20-gauge needle was used to inject the hip area. The injection was tolerated well.  A band aid dressing was applied.  The patient was advised to apply ice later today and tomorrow to the injection sight as needed.  Encounter Diagnoses  Name Primary?   Chronic pain of right knee Yes   Trochanteric bursitis, right hip    Morbid obesity (HCC)    Body mass index 45.0-49.9, adult (HCC)    Return in six weeks.  Call if any problem.  Precautions discussed.  Electronically Signed Lemond Stable, MD 7/30/20259:33 AM

## 2023-11-16 NOTE — Addendum Note (Signed)
 Addended by: MARCINE HUSBAND T on: 11/16/2023 02:04 PM   Modules accepted: Orders

## 2023-12-09 ENCOUNTER — Encounter: Payer: Self-pay | Admitting: Radiology

## 2023-12-28 ENCOUNTER — Ambulatory Visit (INDEPENDENT_AMBULATORY_CARE_PROVIDER_SITE_OTHER): Admitting: Orthopaedic Surgery

## 2023-12-28 ENCOUNTER — Encounter: Payer: Self-pay | Admitting: Orthopaedic Surgery

## 2023-12-28 DIAGNOSIS — G8929 Other chronic pain: Secondary | ICD-10-CM

## 2023-12-28 DIAGNOSIS — M25561 Pain in right knee: Secondary | ICD-10-CM | POA: Diagnosis not present

## 2023-12-28 DIAGNOSIS — Z6841 Body Mass Index (BMI) 40.0 and over, adult: Secondary | ICD-10-CM

## 2023-12-28 DIAGNOSIS — M7061 Trochanteric bursitis, right hip: Secondary | ICD-10-CM

## 2023-12-28 MED ORDER — CYCLOBENZAPRINE HCL 10 MG PO TABS: 10.0000 mg | ORAL_TABLET | Freq: Every day | ORAL | 2 refills | Status: AC

## 2023-12-28 MED ORDER — METHYLPREDNISOLONE ACETATE 40 MG/ML IJ SUSP
40.0000 mg | Freq: Once | INTRAMUSCULAR | Status: AC
  Administered 2023-12-28: 40 mg via INTRA_ARTICULAR

## 2023-12-28 MED ORDER — TRAMADOL HCL 50 MG PO TABS: ORAL_TABLET | ORAL | 4 refills | Status: AC

## 2023-12-28 NOTE — Progress Notes (Signed)
 PROCEDURE NOTE:  The patient request injection, verbal consent was obtained.  The right trochanteric area of the hip was prepped appropriately after time out was performed.   Sterile technique was observed and injection of 1 cc of DepoMedrol 40 mg with several cc's of plain xylocaine. Anesthesia was provided by ethyl chloride and a 20-gauge needle was used to inject the hip area. The injection was tolerated well.  A band aid dressing was applied.  The patient was advised to apply ice later today and tomorrow to the injection sight as needed.  PROCEDURE NOTE:  The patient requests injections of the right knee , verbal consent was obtained.  The right knee was prepped appropriately after time out was performed.   Sterile technique was observed and injection of 1 cc of DepoMedrol 40mg  with several cc's of plain xylocaine. Anesthesia was provided by ethyl chloride and a 20-gauge needle was used to inject the knee area. The injection was tolerated well.  A band aid dressing was applied.  The patient was advised to apply ice later today and tomorrow to the injection sight as needed.  Encounter Diagnoses  Name Primary?   Chronic pain of right knee Yes   Trochanteric bursitis, right hip    Morbid obesity (HCC)    Body mass index 45.0-49.9, adult (HCC)    I have informed the patient I will be retiring from medical practice and from this office on January 19, 2024.  The patient has been offered continuing care with Dr. Margrette or Dr. Onesimo of this office.  The patient may choose another provider and the records will be forwarded after proper signature and notification.  Patient understands and agrees.  She would like to see Dr. Margrette.  I have reviewed the [place]  Controlled Substance Reporting System web site prior to prescribing narcotic medicine for this patient.  Return in six weeks.  Call if any problem.  Precautions discussed.  Electronically Signed Lemond Stable,  MD 9/10/20259:53 AM

## 2023-12-28 NOTE — Addendum Note (Signed)
 Addended by: MARCINE HUSBAND T on: 12/28/2023 10:32 AM   Modules accepted: Orders

## 2024-02-08 ENCOUNTER — Ambulatory Visit: Admitting: Orthopedic Surgery

## 2024-02-08 ENCOUNTER — Other Ambulatory Visit (INDEPENDENT_AMBULATORY_CARE_PROVIDER_SITE_OTHER)

## 2024-02-08 ENCOUNTER — Encounter: Payer: Self-pay | Admitting: Orthopedic Surgery

## 2024-02-08 DIAGNOSIS — M5410 Radiculopathy, site unspecified: Secondary | ICD-10-CM | POA: Diagnosis not present

## 2024-02-08 DIAGNOSIS — M1711 Unilateral primary osteoarthritis, right knee: Secondary | ICD-10-CM

## 2024-02-08 DIAGNOSIS — M7061 Trochanteric bursitis, right hip: Secondary | ICD-10-CM

## 2024-02-08 DIAGNOSIS — M51362 Other intervertebral disc degeneration, lumbar region with discogenic back pain and lower extremity pain: Secondary | ICD-10-CM | POA: Diagnosis not present

## 2024-02-08 DIAGNOSIS — G8929 Other chronic pain: Secondary | ICD-10-CM

## 2024-02-08 MED ORDER — METHYLPREDNISOLONE ACETATE 40 MG/ML IJ SUSP
40.0000 mg | Freq: Once | INTRAMUSCULAR | Status: AC
  Administered 2024-02-08: 40 mg via INTRA_ARTICULAR

## 2024-02-08 NOTE — Progress Notes (Signed)
    02/08/2024   Chief Complaint  Patient presents with   Knee Pain    Right    Hip Pain    Right     No diagnosis found.  What pharmacy do you use ? ______WG scales_____________________  DOI/DOS/ Date:    Unchanged

## 2024-02-08 NOTE — Addendum Note (Signed)
 Addended byBETHA JENEAN GREIG LELON on: 02/08/2024 10:33 AM   Modules accepted: Orders

## 2024-02-08 NOTE — Progress Notes (Signed)
   Taking over care for Dr. Brenna medical record and his notes were reviewed  Chief Complaint  Patient presents with   Knee Pain    Right    Hip Pain    Right     This is a 38 year old female who is status post tibial osteotomy and IM nailing left tibia for osteoarthritis left knee done at [facility].  She is followed there for hip dysplasia as well.  She comes in every 6 to 8 weeks to get an injection in her right hip for bursitis as well as a right knee injection for osteoarthritis and valgus deformity  She carries a diagnosis of hip dysplasia as well and is followed at [facility] for that.  She is here today for reevaluation of right hip pain which may be related to a lumbar spine condition and reevaluation of her right knee joint   Since I have not seen her recently I reexamined her  She has tenderness in her lower back she has tenderness along the L5 nerve root she has tenderness over the right greater trochanter  She has crepitance in the right knee with pain valgus deformity and lateral joint line tenderness  Encounter Diagnoses  Name Primary?   Chronic pain of right knee Yes   Radicular pain of right lower extremity    Degeneration of intervertebral disc of lumbar region with discogenic back pain and lower extremity pain    Trochanteric bursitis, right hip    Primary osteoarthritis of right knee     Recommendations:  Inject right knee joint  Inject right hip greater trochanteric bursa  PT for back pain  Procedure note right knee injection   verbal consent was obtained to inject right knee joint  Timeout was completed to confirm the site of injection  The medications used were depomedrol 40 mg and 1% lidocaine 3 cc Anesthesia was provided by ethyl chloride and the skin was prepped with alcohol.  After cleaning the skin with alcohol a 20-gauge needle was used to inject the right knee joint. There were no complications. A sterile bandage was applied.  Procedure  note injection for right hip bursitis  Verbal consent was obtained for injection of the right hip  Timeout was completed to confirm the injection site  The medications used were 40 mg of Depo-Medrol  and 1% lidocaine 3 cc  Anesthesia was provided by ethyl chloride and the skin was prepped with alcohol.  After cleaning the skin with alcohol a 25-gauge needle was used to inject the greater trochanteric bursa right hip   No complications were noted

## 2024-02-08 NOTE — Addendum Note (Signed)
 Addended byBETHA JENEAN GREIG LELON on: 02/08/2024 10:29 AM   Modules accepted: Orders

## 2024-02-15 ENCOUNTER — Telehealth: Payer: Self-pay

## 2024-02-15 DIAGNOSIS — N390 Urinary tract infection, site not specified: Secondary | ICD-10-CM | POA: Diagnosis not present

## 2024-02-15 DIAGNOSIS — I10 Essential (primary) hypertension: Secondary | ICD-10-CM | POA: Diagnosis not present

## 2024-02-15 NOTE — Telephone Encounter (Signed)
 Return call to pt. Pt state's she contact her PCP and will do urinalysis there but need a follow up appointment with a urologist. Pt is scheduled and voiced understanding

## 2024-02-15 NOTE — Telephone Encounter (Signed)
 Dysuria  Patient called with c/o dysuria x 3-5 days days.  Pain: dull  Severity:3/10  Associated Signs and Symptoms:  Fever: noTemp. Chills: yes Hematuria: no Urgency: yes Frequency: yes Hesitancy:no Incontinence: no Nausea: no Vomiting: no  Message sent to clinic staff to return call to patient to advise of plan.   Call:  [phone]

## 2024-02-20 ENCOUNTER — Encounter: Payer: Self-pay | Admitting: Radiology

## 2024-03-21 ENCOUNTER — Ambulatory Visit: Admitting: Orthopedic Surgery

## 2024-03-26 ENCOUNTER — Telehealth: Payer: Self-pay

## 2024-03-26 ENCOUNTER — Other Ambulatory Visit

## 2024-03-26 ENCOUNTER — Other Ambulatory Visit: Payer: Self-pay

## 2024-03-26 DIAGNOSIS — N390 Urinary tract infection, site not specified: Secondary | ICD-10-CM

## 2024-03-26 LAB — URINALYSIS, ROUTINE W REFLEX MICROSCOPIC
Bilirubin, UA: NEGATIVE
Glucose, UA: NEGATIVE
Ketones, UA: NEGATIVE
Nitrite, UA: POSITIVE — AB
Protein,UA: NEGATIVE
RBC, UA: NEGATIVE
Specific Gravity, UA: 1.025 (ref 1.005–1.030)
Urobilinogen, Ur: 0.2 mg/dL (ref 0.2–1.0)
pH, UA: 6 (ref 5.0–7.5)

## 2024-03-26 LAB — MICROSCOPIC EXAMINATION

## 2024-03-26 MED ORDER — NITROFURANTOIN MONOHYD MACRO 100 MG PO CAPS: 100.0000 mg | ORAL_CAPSULE | Freq: Two times a day (BID) | ORAL | 0 refills | Status: DC

## 2024-03-26 NOTE — Telephone Encounter (Signed)
 Dysuria  Patient called with c/o dysuria x 2-3 weeks days.  Pain: dull  Severity:4/10  Associated Signs and Symptoms:  Fever: noTemp.0 Chills: yes Hematuria: no Urgency: no Frequency: Yes Hesitancy:yes Incontinence: no Nausea: no Vomiting: no  Urologic History:  Any Recent Urologic Surgeries or Procedures:no Recurrent UTI's:yes Cystitis: no  Prostatitis:no Kidney or Bladder Stones: yes in the pass Plan: Walk-in Clinic: no Appointment w/Physician: [no Lab visit scheduled for urine drop off: Yes Advice given:  Do you take on daily medications for UTI suppression No

## 2024-03-26 NOTE — Telephone Encounter (Signed)
 Patient presents today with complaints of  Dysuria.  UA and Culture done today.  Dr. Sherrilee reviewed results and Macrobid  100 mg BID X 7 days .  Patient aware of MD recommendations and that we will reach out with culture results.      Dyjwpvlj, CMA

## 2024-03-29 ENCOUNTER — Encounter: Payer: Self-pay | Admitting: Orthopedic Surgery

## 2024-03-29 ENCOUNTER — Ambulatory Visit: Admitting: Orthopedic Surgery

## 2024-03-29 DIAGNOSIS — M7061 Trochanteric bursitis, right hip: Secondary | ICD-10-CM | POA: Diagnosis not present

## 2024-03-29 DIAGNOSIS — G8929 Other chronic pain: Secondary | ICD-10-CM | POA: Diagnosis not present

## 2024-03-29 DIAGNOSIS — M25561 Pain in right knee: Secondary | ICD-10-CM | POA: Diagnosis not present

## 2024-03-29 LAB — URINE CULTURE

## 2024-03-29 MED ORDER — METHYLPREDNISOLONE ACETATE 40 MG/ML IJ SUSP
40.0000 mg | Freq: Once | INTRAMUSCULAR | Status: AC
  Administered 2024-03-29: 40 mg via INTRA_ARTICULAR

## 2024-03-29 NOTE — Progress Notes (Addendum)
° °  Procedure note for injection   Chief Complaint  Patient presents with   Injections    Right knee/ right trochanter     Encounter Diagnoses  Name Primary?   Chronic pain of right knee Yes   Trochanteric bursitis, right hip       Prefers medial knee injection   The patient has consented for injection of the right Joint: knee  Medication: Depo-Medrol  40 mg and lidocaine 1%  Time out completed: Yes  The site of injection was cleaned with alcohol and ethyl chloride.  The injection was given without any complications appropriate precautions were given.  Procedure note injection for right hip bursitis  Verbal consent was obtained for injection of the right hip  Timeout was completed to confirm the injection site  The medications used were 40 mg of Depo-Medrol  and 1% lidocaine 3 cc  Anesthesia was provided by ethyl chloride and the skin was prepped with alcohol.  After cleaning the skin with alcohol a 25-gauge needle was used to inject the greater trochanteric bursa right hip   No complications were noted   6 weeks prn

## 2024-03-30 ENCOUNTER — Ambulatory Visit: Payer: Self-pay

## 2024-04-23 ENCOUNTER — Ambulatory Visit: Admitting: Urology

## 2024-04-23 VITALS — BP 146/81 | HR 65

## 2024-04-23 DIAGNOSIS — N390 Urinary tract infection, site not specified: Secondary | ICD-10-CM

## 2024-04-23 DIAGNOSIS — Z87440 Personal history of urinary (tract) infections: Secondary | ICD-10-CM | POA: Diagnosis not present

## 2024-04-23 MED ORDER — NITROFURANTOIN MACROCRYSTAL 100 MG PO CAPS: ORAL_CAPSULE | ORAL | 1 refills | Status: AC

## 2024-04-23 NOTE — Progress Notes (Signed)
 "  04/23/2024 3:43 PM   [name] 01-11-1986 [id number]  Referring provider: Carlette Benita Area, MD [address] [place],  [place] [address]  No chief complaint on file.  New patient for me-  1) OAB-associated with frequency urgency nocturia with occasional urge incontinence.  No enuresis.  On surveillance.  PVR 143. 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  CT 2023 with no hydro and a nondistended bladder.  2) kidney stones-she passed a stone spontaneously.  Last CT 2023 - a Punctate left renal stone with left renal scarring   04/23/2024: Today patient seen for the above. Dec 2025 urine cx enterococcus. She has an immature bladder and frequency. She get foul odor. Stream intermittent. Rare incontinence. Can go longer than 2 hrs. Can postpone but some urgency. Can be PC. Abx clear but return. She still has periods. KUB was ordered but not done.  UA today with no red cells and many bacteria.  She is engineer, agricultural.   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 If you have any questions, ask your nurse or doctor.          acetaminophen  325 MG tablet Commonly known as: TYLENOL  Take 650 mg by mouth every 6 (six) hours as needed.   cyclobenzaprine  10 MG tablet Commonly known as: FLEXERIL  Take 1 tablet (10 mg total) by mouth at bedtime.   nitrofurantoin  (macrocrystal-monohydrate) 100 MG capsule Commonly known as: MACROBID  Take 1 capsule (100 mg total) by mouth every 12  (twelve) hours.   traMADol  50 MG tablet Commonly known as: Ultram  One tablet every six hours as needed for pain.        Allergies: Allergies[1]  Family History: Family History  Problem Relation Age of Onset   Cancer Other    Diabetes Other    Thyroid  disease Mother    Hypertension Mother    Heart attack Mother    Cancer Maternal Grandmother    Asthma Maternal Grandmother    Arthritis Maternal Grandmother    Cancer Maternal Grandfather    Diabetes Maternal Grandfather    Thyroid  disease Maternal Aunt     Social History:  reports that she quit smoking about 3 years ago. Her smoking use included cigarettes. She started smoking about 5 years ago. She has a 0.5 pack-year smoking history. She has never used smokeless tobacco. She reports that she does not drink alcohol and does not use drugs.   Physical Exam: BP (!) 146/81   Pulse 65   Constitutional:  Alert and oriented, No acute distress. HEENT: [place] AT, moist mucus membranes.  Trachea midline, no masses. Cardiovascular: No clubbing, cyanosis, or edema. Respiratory: Normal respiratory effort, no increased work of breathing. GI: Abdomen is soft, nontender, nondistended, no abdominal masses GU: No CVA tenderness Skin: No rashes, bruises or suspicious lesions. Neurologic:  Grossly intact, no focal deficits, moving all 4 extremities. Psychiatric: Normal mood and affect.  Laboratory Data: Lab Results  Component Value Date   WBC 14.4 (H) 08/03/2019   HGB 11.2 (L) 08/03/2019   HCT 34.0 (L) 08/03/2019   MCV 83.3 08/03/2019   PLT 249 08/03/2019    Lab Results  Component Value Date   CREATININE 0.50 08/01/2019    No results found for: PSA  No results found for: TESTOSTERONE  Lab Results  Component Value Date   HGBA1C 5.3 (A) 06/15/2021    Urinalysis    Component Value Date/Time   COLORURINE Ryin (A) 03/16/2019 1320   APPEARANCEUR Cloudy (A) 03/26/2024 1030   LABSPEC 1.014 03/16/2019 1320   PHURINE 6.0  03/16/2019 1320   GLUCOSEU Negative 03/26/2024 1030   HGBUR NEGATIVE 03/16/2019 1320   BILIRUBINUR Negative 03/26/2024 1030   KETONESUR NEGATIVE 03/16/2019 1320   PROTEINUR Negative 03/26/2024 1030   PROTEINUR NEGATIVE 03/16/2019 1320   UROBILINOGEN 0.2 11/22/2014 1545   NITRITE Positive (A) 03/26/2024 1030   NITRITE POSITIVE (A) 03/16/2019 1320   LEUKOCYTESUR Trace (A) 03/26/2024 1030   LEUKOCYTESUR TRACE (A) 03/16/2019 1320    Lab Results  Component Value Date   LABMICR See below: 03/26/2024   WBCUA 6-10 (A) 03/26/2024   LABEPIT 0-10 03/26/2024   BACTERIA Many (A) 03/26/2024    Pertinent Imaging: N/a   Results for orders placed during the hospital encounter of 08/14/21  CT RENAL STONE STUDY  Narrative CLINICAL DATA:  Right flank pain, history of kidney stones. 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 Frequent urinary tract infections.  EXAM: CT ABDOMEN AND PELVIS WITHOUT CONTRAST  TECHNIQUE: Multidetector CT imaging of the abdomen and pelvis was performed following the standard protocol without IV contrast.  RADIATION DOSE REDUCTION: This exam was performed according to the departmental dose-optimization program which includes automated exposure control, adjustment of the mA and/or kV according to patient size and/or use of iterative reconstruction technique.  COMPARISON:  None.  FINDINGS: Lower chest: Lung bases are clear. Heart size normal. No pericardial or pleural effusion. Distal esophagus is unremarkable.  Hepatobiliary: Liver is enlarged, 18.7 cm. Stones in the gallbladder. No biliary ductal dilatation.  Pancreas: Negative.  Spleen: Negative.  Adrenals/Urinary Tract: Adrenal glands and right kidney are unremarkable. Scarring in the left kidney. Punctate stone in the upper pole left kidney. Ureters are decompressed. Bladder is grossly unremarkable.  Stomach/Bowel: Stomach, small bowel, appendix and colon  are unremarkable.  Vascular/Lymphatic: Vascular structures are unremarkable. No pathologically enlarged lymph nodes.  Reproductive: Uterus is visualized.  No adnexal mass.  Other: No free fluid.  Mesenteries and peritoneum are unremarkable.  Musculoskeletal: Well-circumscribed lucency in the right acetabulum appears benign and may be degenerative in etiology. Degenerative changes the lower thoracic spine. Right femoral head is slightly laterally subluxed with collar osteophytosis. No worrisome lytic or sclerotic lesions.  IMPRESSION: 1. No acute findings. 2. Hepatomegaly. 3. Punctate left renal stone with left renal scarring. 4. Cholelithiasis. 5. Slightly laterally subluxed right femoral head with associated collar osteophytosis.   Electronically Signed By: Newell Eke M.D. On: 08/14/2021 13:29   Assessment & Plan:    1. Frequent UTI (Primary) - discussed nature r/b/a to ABX for bacteruria. Start d-mannose and PC abx (limit abx). Plenty of water .    - Urinalysis, Routine w reflex microscopic; Future - Urinalysis, Routine w reflex microscopic - Urine Culture; Future - Urine Culture   No follow-ups on file.  Donnice Brooks, MD  [facility]  [address] [place], [place] [address] [phone]      [1]  Allergies Allergen Reactions   Bee Venom Swelling   "

## 2024-04-24 LAB — URINALYSIS, ROUTINE W REFLEX MICROSCOPIC
Bilirubin, UA: NEGATIVE
Glucose, UA: NEGATIVE
Ketones, UA: NEGATIVE
Nitrite, UA: POSITIVE — AB
Protein,UA: NEGATIVE
RBC, UA: NEGATIVE
Specific Gravity, UA: 1.025 (ref 1.005–1.030)
Urobilinogen, Ur: 0.2 mg/dL (ref 0.2–1.0)
pH, UA: 6 (ref 5.0–7.5)

## 2024-04-24 LAB — MICROSCOPIC EXAMINATION

## 2024-04-26 LAB — URINE CULTURE

## 2024-04-27 ENCOUNTER — Ambulatory Visit: Payer: Self-pay

## 2024-04-30 ENCOUNTER — Other Ambulatory Visit: Payer: Self-pay

## 2024-04-30 DIAGNOSIS — N390 Urinary tract infection, site not specified: Secondary | ICD-10-CM

## 2024-04-30 MED ORDER — CEPHALEXIN 500 MG PO CAPS: 500.0000 mg | ORAL_CAPSULE | Freq: Two times a day (BID) | ORAL | 0 refills | Status: AC

## 2024-05-10 ENCOUNTER — Ambulatory Visit: Admitting: Orthopedic Surgery

## 2024-05-10 ENCOUNTER — Encounter: Payer: Self-pay | Admitting: Orthopedic Surgery

## 2024-05-10 DIAGNOSIS — G8929 Other chronic pain: Secondary | ICD-10-CM | POA: Diagnosis not present

## 2024-05-10 DIAGNOSIS — M7061 Trochanteric bursitis, right hip: Secondary | ICD-10-CM | POA: Diagnosis not present

## 2024-05-10 DIAGNOSIS — M25561 Pain in right knee: Secondary | ICD-10-CM

## 2024-05-10 MED ORDER — METHYLPREDNISOLONE ACETATE 40 MG/ML IJ SUSP
40.0000 mg | Freq: Once | INTRAMUSCULAR | Status: AC
  Administered 2024-05-10: 40 mg via INTRA_ARTICULAR

## 2024-05-10 NOTE — Progress Notes (Signed)
" ° °  Procedure note for injection   Chief Complaint  Patient presents with   Injections    Right knee and right hip      Encounter Diagnoses  Name Primary?   Chronic pain of right knee Yes   Trochanteric bursitis, right hip         The patient has consented for injection of the right  Joint: knee  Medication: Depo-Medrol  40 mg and lidocaine 1%  Time out completed: Yes  The site of injection was cleaned with alcohol and ethyl chloride.  The injection was given without any complications appropriate precautions were given.  Procedure note for injection   Chief Complaint  Patient presents with   Injections    Right knee and right hip      Encounter Diagnoses  Name Primary?   Chronic pain of right knee Yes   Trochanteric bursitis, right hip         The patient has consented for injection of the right  Joint: trochanteric bursa  Medication: Depo-Medrol  40 mg and lidocaine 1%  Time out completed: Yes  The site of injection was cleaned with alcohol and ethyl chloride.  The injection was given without any complications appropriate precautions were given.  "

## 2024-07-23 ENCOUNTER — Other Ambulatory Visit: Admitting: Urology

## 2024-09-13 ENCOUNTER — Ambulatory Visit: Admitting: Urology
